# Patient Record
Sex: Male | Born: 1975 | Race: Black or African American | Hispanic: No | Marital: Single | Smoking: Current some day smoker
Health system: Southern US, Community
[De-identification: ages and names within clinical notes are randomized; demographics above are authoritative.]

## PROBLEM LIST (undated history)

## (undated) DIAGNOSIS — Q8901 Asplenia (congenital): Secondary | ICD-10-CM

## (undated) DIAGNOSIS — B20 Human immunodeficiency virus [HIV] disease: Secondary | ICD-10-CM

## (undated) DIAGNOSIS — E119 Type 2 diabetes mellitus without complications: Secondary | ICD-10-CM

## (undated) DIAGNOSIS — E785 Hyperlipidemia, unspecified: Secondary | ICD-10-CM

## (undated) DIAGNOSIS — Z21 Asymptomatic human immunodeficiency virus [HIV] infection status: Secondary | ICD-10-CM

## (undated) DIAGNOSIS — Z87442 Personal history of urinary calculi: Secondary | ICD-10-CM

## (undated) DIAGNOSIS — I10 Essential (primary) hypertension: Secondary | ICD-10-CM

## (undated) DIAGNOSIS — Z8619 Personal history of other infectious and parasitic diseases: Secondary | ICD-10-CM

---
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
## 2014-12-31 DIAGNOSIS — Z8619 Personal history of other infectious and parasitic diseases: Secondary | ICD-10-CM | POA: Insufficient documentation

## 2014-12-31 DIAGNOSIS — B20 Human immunodeficiency virus [HIV] disease: Secondary | ICD-10-CM | POA: Insufficient documentation

## 2014-12-31 DIAGNOSIS — Z9081 Acquired absence of spleen: Secondary | ICD-10-CM | POA: Insufficient documentation

## 2015-10-30 DIAGNOSIS — Z6841 Body Mass Index (BMI) 40.0 and over, adult: Secondary | ICD-10-CM

## 2015-10-30 DIAGNOSIS — A530 Latent syphilis, unspecified as early or late: Secondary | ICD-10-CM | POA: Insufficient documentation

## 2015-10-30 DIAGNOSIS — Z9081 Acquired absence of spleen: Secondary | ICD-10-CM | POA: Insufficient documentation

## 2015-10-30 DIAGNOSIS — Z21 Asymptomatic human immunodeficiency virus [HIV] infection status: Secondary | ICD-10-CM | POA: Insufficient documentation

## 2015-10-30 HISTORY — DX: Latent syphilis, unspecified as early or late: A53.0

## 2015-12-01 DIAGNOSIS — L404 Guttate psoriasis: Secondary | ICD-10-CM | POA: Insufficient documentation

## 2016-05-06 DIAGNOSIS — Z8619 Personal history of other infectious and parasitic diseases: Secondary | ICD-10-CM

## 2016-05-06 HISTORY — DX: Personal history of other infectious and parasitic diseases: Z86.19

## 2016-05-07 ENCOUNTER — Inpatient Hospital Stay
Admission: EM | Admit: 2016-05-07 | Discharge: 2016-05-12 | DRG: 871 | Disposition: A | Discharge status: Home / Self Care | Payer: Managed Care, Other (non HMO) | Attending: Internal Medicine | Admitting: Internal Medicine

## 2016-05-07 ENCOUNTER — Emergency Department: Payer: Managed Care, Other (non HMO)

## 2016-05-07 DIAGNOSIS — A4159 Other Gram-negative sepsis: Secondary | ICD-10-CM | POA: Diagnosis present

## 2016-05-07 DIAGNOSIS — R1084 Generalized abdominal pain: Secondary | ICD-10-CM | POA: Diagnosis not present

## 2016-05-07 DIAGNOSIS — E785 Hyperlipidemia, unspecified: Secondary | ICD-10-CM | POA: Diagnosis present

## 2016-05-07 DIAGNOSIS — A419 Sepsis, unspecified organism: Secondary | ICD-10-CM | POA: Diagnosis present

## 2016-05-07 DIAGNOSIS — D649 Anemia, unspecified: Secondary | ICD-10-CM | POA: Diagnosis present

## 2016-05-07 DIAGNOSIS — R0600 Dyspnea, unspecified: Secondary | ICD-10-CM

## 2016-05-07 DIAGNOSIS — Z8619 Personal history of other infectious and parasitic diseases: Secondary | ICD-10-CM

## 2016-05-07 DIAGNOSIS — I248 Other forms of acute ischemic heart disease: Secondary | ICD-10-CM | POA: Diagnosis present

## 2016-05-07 DIAGNOSIS — R509 Fever, unspecified: Secondary | ICD-10-CM | POA: Diagnosis not present

## 2016-05-07 DIAGNOSIS — R0902 Hypoxemia: Secondary | ICD-10-CM | POA: Diagnosis not present

## 2016-05-07 DIAGNOSIS — F17210 Nicotine dependence, cigarettes, uncomplicated: Secondary | ICD-10-CM | POA: Diagnosis present

## 2016-05-07 DIAGNOSIS — R112 Nausea with vomiting, unspecified: Secondary | ICD-10-CM

## 2016-05-07 DIAGNOSIS — Z01818 Encounter for other preprocedural examination: Secondary | ICD-10-CM | POA: Diagnosis not present

## 2016-05-07 DIAGNOSIS — Q8901 Asplenia (congenital): Secondary | ICD-10-CM

## 2016-05-07 DIAGNOSIS — E871 Hypo-osmolality and hyponatremia: Secondary | ICD-10-CM | POA: Diagnosis present

## 2016-05-07 DIAGNOSIS — R1110 Vomiting, unspecified: Secondary | ICD-10-CM | POA: Diagnosis not present

## 2016-05-07 DIAGNOSIS — J9601 Acute respiratory failure with hypoxia: Secondary | ICD-10-CM | POA: Diagnosis not present

## 2016-05-07 DIAGNOSIS — Z7982 Long term (current) use of aspirin: Secondary | ICD-10-CM

## 2016-05-07 DIAGNOSIS — R609 Edema, unspecified: Secondary | ICD-10-CM | POA: Diagnosis not present

## 2016-05-07 DIAGNOSIS — Z880 Allergy status to penicillin: Secondary | ICD-10-CM

## 2016-05-07 DIAGNOSIS — N359 Urethral stricture, unspecified: Secondary | ICD-10-CM | POA: Diagnosis present

## 2016-05-07 DIAGNOSIS — R109 Unspecified abdominal pain: Secondary | ICD-10-CM

## 2016-05-07 DIAGNOSIS — R17 Unspecified jaundice: Secondary | ICD-10-CM | POA: Diagnosis present

## 2016-05-07 DIAGNOSIS — Z6841 Body Mass Index (BMI) 40.0 and over, adult: Secondary | ICD-10-CM

## 2016-05-07 DIAGNOSIS — E1165 Type 2 diabetes mellitus with hyperglycemia: Secondary | ICD-10-CM | POA: Diagnosis present

## 2016-05-07 DIAGNOSIS — I10 Essential (primary) hypertension: Secondary | ICD-10-CM | POA: Diagnosis present

## 2016-05-07 DIAGNOSIS — N179 Acute kidney failure, unspecified: Secondary | ICD-10-CM | POA: Diagnosis present

## 2016-05-07 DIAGNOSIS — Z21 Asymptomatic human immunodeficiency virus [HIV] infection status: Secondary | ICD-10-CM | POA: Diagnosis present

## 2016-05-07 DIAGNOSIS — R1013 Epigastric pain: Secondary | ICD-10-CM

## 2016-05-07 DIAGNOSIS — N132 Hydronephrosis with renal and ureteral calculous obstruction: Secondary | ICD-10-CM | POA: Diagnosis present

## 2016-05-07 DIAGNOSIS — Z79899 Other long term (current) drug therapy: Secondary | ICD-10-CM

## 2016-05-07 DIAGNOSIS — N358 Other urethral stricture: Secondary | ICD-10-CM | POA: Diagnosis not present

## 2016-05-07 DIAGNOSIS — E440 Moderate protein-calorie malnutrition: Secondary | ICD-10-CM | POA: Insufficient documentation

## 2016-05-07 HISTORY — DX: Type 2 diabetes mellitus without complications: E11.9

## 2016-05-07 HISTORY — DX: Human immunodeficiency virus (HIV) disease: B20

## 2016-05-07 HISTORY — DX: Asymptomatic human immunodeficiency virus (hiv) infection status: Z21

## 2016-05-07 HISTORY — DX: Essential (primary) hypertension: I10

## 2016-05-07 HISTORY — DX: Asplenia (congenital): Q89.01

## 2016-05-07 LAB — TROPONIN I: Troponin I: 0.1 ng/mL (ref <0.03)

## 2016-05-07 LAB — COMPREHENSIVE METABOLIC PANEL
ALT: 22 U/L (ref 17–63)
ANION GAP: 10 (ref 5–15)
AST: 21 U/L (ref 15–41)
Albumin: 3.9 g/dL (ref 3.5–5.0)
Alkaline Phosphatase: 81 U/L (ref 38–126)
BILIRUBIN TOTAL: 1.3 mg/dL — AB (ref 0.3–1.2)
BUN: 14 mg/dL (ref 6–20)
CO2: 24 mmol/L (ref 22–32)
Calcium: 8.9 mg/dL (ref 8.9–10.3)
Chloride: 95 mmol/L — ABNORMAL LOW (ref 101–111)
Creatinine, Ser: 1.12 mg/dL (ref 0.61–1.24)
GFR calc Af Amer: >60 mL/min (ref >60)
Glucose, Bld: 301 mg/dL — ABNORMAL HIGH (ref 65–99)
POTASSIUM: 4 mmol/L (ref 3.5–5.1)
Sodium: 129 mmol/L — ABNORMAL LOW (ref 135–145)
TOTAL PROTEIN: 8.1 g/dL (ref 6.5–8.1)

## 2016-05-07 LAB — CBC WITH DIFFERENTIAL/PLATELET
Basophils Absolute: 0.1 10*3/uL (ref 0–0.1)
Basophils Relative: 0 %
EOS ABS: 0 10*3/uL (ref 0–0.7)
EOS PCT: 0 %
HCT: 40.6 % (ref 40.0–52.0)
HEMOGLOBIN: 13.8 g/dL (ref 13.0–18.0)
LYMPHS ABS: 0.5 10*3/uL — AB (ref 1.0–3.6)
Lymphocytes Relative: 3 %
MCH: 27.4 pg (ref 26.0–34.0)
MCHC: 34 g/dL (ref 32.0–36.0)
MCV: 80.6 fL (ref 80.0–100.0)
MONOS PCT: 6 %
Monocytes Absolute: 0.9 10*3/uL (ref 0.2–1.0)
NEUTROS PCT: 91 %
Neutro Abs: 14.9 10*3/uL — ABNORMAL HIGH (ref 1.4–6.5)
Platelets: 162 10*3/uL (ref 150–440)
RBC: 5.04 MIL/uL (ref 4.40–5.90)
RDW: 14.7 % — ABNORMAL HIGH (ref 11.5–14.5)
WBC: 16.4 10*3/uL — ABNORMAL HIGH (ref 3.8–10.6)

## 2016-05-07 LAB — URINALYSIS, COMPLETE (UACMP) WITH MICROSCOPIC
BILIRUBIN URINE: NEGATIVE
Bacteria, UA: NONE SEEN
KETONES UR: 80 mg/dL — AB
LEUKOCYTES UA: NEGATIVE
NITRITE: NEGATIVE
PH: 5 (ref 5.0–8.0)
Protein, ur: 100 mg/dL — AB
Specific Gravity, Urine: 1.033 — ABNORMAL HIGH (ref 1.005–1.030)

## 2016-05-07 LAB — INFLUENZA PANEL BY PCR (TYPE A & B)
Influenza A By PCR: NEGATIVE
Influenza B By PCR: NEGATIVE

## 2016-05-07 LAB — LACTIC ACID, PLASMA: LACTIC ACID, VENOUS: 2.2 mmol/L — AB (ref 0.5–1.9)

## 2016-05-07 LAB — LIPASE, BLOOD

## 2016-05-07 LAB — CK: Total CK: 252 U/L (ref 49–397)

## 2016-05-07 MED ORDER — DEXTROSE 5 % IV SOLN
2.0000 g | Freq: Once | INTRAVENOUS | Status: AC
  Administered 2016-05-07: 2 g via INTRAVENOUS
  Filled 2016-05-07: qty 2

## 2016-05-07 MED ORDER — SODIUM CHLORIDE 0.9 % IV BOLUS (SEPSIS)
1000.0000 mL | Freq: Once | INTRAVENOUS | Status: AC
  Administered 2016-05-07: 1000 mL via INTRAVENOUS

## 2016-05-07 MED ORDER — VANCOMYCIN HCL IN DEXTROSE 1-5 GM/200ML-% IV SOLN
1000.0000 mg | Freq: Once | INTRAVENOUS | Status: AC
  Administered 2016-05-07: 1000 mg via INTRAVENOUS
  Filled 2016-05-07 (×2): qty 200

## 2016-05-07 MED ORDER — ONDANSETRON HCL 4 MG/2ML IJ SOLN
4.0000 mg | Freq: Once | INTRAMUSCULAR | Status: AC
  Administered 2016-05-07: 4 mg via INTRAVENOUS

## 2016-05-07 MED ORDER — PROMETHAZINE HCL 25 MG/ML IJ SOLN
25.0000 mg | Freq: Once | INTRAMUSCULAR | Status: AC
  Administered 2016-05-07: 25 mg via INTRAVENOUS
  Filled 2016-05-07: qty 1

## 2016-05-07 MED ORDER — ONDANSETRON HCL 4 MG/2ML IJ SOLN
INTRAMUSCULAR | Status: AC
  Administered 2016-05-07: 4 mg via INTRAVENOUS
  Filled 2016-05-07: qty 2

## 2016-05-07 MED ORDER — LEVOFLOXACIN IN D5W 750 MG/150ML IV SOLN
750.0000 mg | Freq: Once | INTRAVENOUS | Status: AC
  Administered 2016-05-07: 750 mg via INTRAVENOUS
  Filled 2016-05-07 (×2): qty 150

## 2016-05-07 MED ORDER — ACETAMINOPHEN 650 MG RE SUPP
650.0000 mg | Freq: Four times a day (QID) | RECTAL | Status: DC | PRN
Start: 2016-05-07 — End: 2016-05-12

## 2016-05-07 MED ORDER — ACETAMINOPHEN 325 MG PO TABS
650.0000 mg | ORAL_TABLET | Freq: Four times a day (QID) | ORAL | Status: DC | PRN
  Administered 2016-05-07 – 2016-05-09 (×6): 650 mg via ORAL
  Filled 2016-05-07 (×6): qty 2

## 2016-05-07 MED ORDER — KETOROLAC TROMETHAMINE 30 MG/ML IJ SOLN
30.0000 mg | Freq: Once | INTRAMUSCULAR | Status: AC
  Administered 2016-05-07: 30 mg via INTRAVENOUS
  Filled 2016-05-07: qty 1

## 2016-05-07 MED ORDER — PROMETHAZINE HCL 25 MG/ML IJ SOLN
INTRAMUSCULAR | Status: AC
  Administered 2016-05-07: 25 mg via INTRAVENOUS
  Filled 2016-05-07: qty 1

## 2016-05-07 NOTE — H&P (Addendum)
History and Physical   [facility] - [place] [facility] Admission History and Physical [employer] Crayton Savarese, D.O.    Patient Name: Dave Conrad MR#: [id number] Date of Birth: [birth year] Date of Admission: 05/07/2016  Referring MD/NP/PA: Dr. Lenard LancePaduchowski Primary Care Physician: [facility] Patient coming from: Home Outpatient Specialists: [facility]   Chief Complaint:  Chief Complaint  Patient presents with  . Emesis  . Abdominal Pain    HPI: Dave Conrad is a 41 y.o. male with a known history of DM2, HIV, HTN presents to the emergency department for evaluation of vomiting.  Patient was in a usual state of health until 4 days ago when he describes the onset of vomiting foodstuffs. He states that he has had nausea and decreased by mouth intake over the past 4 days. He reports associated abdominal soreness. He reports a mild dry cough that has been ongoing. He denies abdominal cramping or diarrhea..   Otherwise there has been no change in status. Patient has been taking medication as prescribed and there has been no recent change in medication or diet.  No recent antibiotics.  There has been no recent illness, hospitalizations, travel or sick contacts.    Patient denies fevers/chills, weakness, dizziness, chest pain, shortness of breath,  abdominal pain, dysuria/frequency, changes in mental status.   ED Course: Patient received Levaquin, Azactam, Vanco, Toradol, Phenergan, Zofran, NS  Review of Systems:  CONSTITUTIONAL: No fever/chills, fatigue, weakness, weight gain/loss, headache. EYES: No blurry or double vision. ENT: No tinnitus, postnasal drip, redness or soreness of the oropharynx. RESPIRATORY: Positive dry cough, dyspnea, wheeze.  No hemoptysis.  CARDIOVASCULAR: No chest pain, palpitations, syncope, orthopnea. No lower extremity edema.  GASTROINTESTINAL: Positive nausea, vomiting, negative abdominal pain, diarrhea, constipation.  No hematemesis, melena or  hematochezia. GENITOURINARY: No dysuria, frequency, hematuria. ENDOCRINE: No polyuria or nocturia. No heat or cold intolerance. HEMATOLOGY: No anemia, bruising, bleeding. INTEGUMENTARY: No rashes, ulcers, lesions. MUSCULOSKELETAL: No arthritis, gout, dyspnea. NEUROLOGIC: No numbness, tingling, ataxia, seizure-type activity, weakness. PSYCHIATRIC: No anxiety, depression, insomnia.   Past Medical History:  Diagnosis Date  . Diabetes mellitus without complication (HCC)   . HIV (human immunodeficiency virus infection) (HCC)   . Hypertension     History reviewed. No pertinent surgical history.   reports that he has been smoking Cigarettes.  He has never used smokeless tobacco. He reports that he does not drink alcohol. His drug history is not on file.  Allergies  Allergen Reactions  . Penicillins Nausea And Vomiting   Family History   Medical History Relation Name Comments  Diabetes Father    Diabetes Mother       Current Medications Reconcile with Patient's Chart  Prescription Sig. Disp. Refills Start Date End Date Status  lisinopril (PRINIVIL) 40 mg tablet     10/08/2014  Active  diltiazem (DILACOR XR) 120 mg Extended Release capsule     10/08/2014  Active  ATRIPLA 600-200-300 mg Tablet  Take 1 Tab by mouth daily before breakfast.          Physical Exam: Vitals:   05/07/16 1253 05/07/16 1720 05/07/16 1956 05/07/16 2226  BP:   (!) 159/95 (!) 148/89  Pulse:   (!) 130 (!) 130  Resp:   (!) 28 18  Temp:  99.6 F (37.6 C) (!) 100.9 F (38.3 C)   TempSrc:  Oral Oral   SpO2:   96% 99%  Weight: 120.2 kg (265 lb)   120.2 kg (265 lb)  Height: 5\' 6"  (  1.676 m)   5\' 6"  (1.676 m)    GENERAL: 41 y.o.-year-old male patient, well-developed, well-nourished lying in the bed in no acute distress.  Pleasant and cooperative.   HEENT: Head atraumatic, normocephalic. Pupils equal, round, reactive to light and accommodation. No scleral icterus. Extraocular muscles  intact. Nares are patent.  Possibly a shiny whitish film in the interior oral mucosa. No tongue lesions. Mucus membranes moist. NECK: Supple, full range of motion. No JVD, no bruit heard. No thyroid enlargement, no tenderness, no cervical lymphadenopathy. CHEST: Normal breath sounds bilaterally. No wheezing, rales, rhonchi or crackles. No use of accessory muscles of respiration.  No reproducible chest wall tenderness.  CARDIOVASCULAR: S1, S2 normal. No murmurs, rubs, or gallops. Cap refill <2 seconds. Pulses intact distally.  ABDOMEN: Soft, nondistended, nontender. No rebound, guarding, rigidity. Normoactive bowel sounds present in all four quadrants. No organomegaly or mass. EXTREMITIES: No pedal edema, cyanosis, or clubbing. No calf tenderness or Homan's sign.  NEUROLOGIC: The patient is alert and oriented x 3. Cranial nerves II through XII are grossly intact with no focal sensorimotor deficit. Muscle strength 5/5 in all extremities. Sensation intact. Gait not checked. PSYCHIATRIC:  Normal affect, mood, thought content. SKIN: Warm, dry, and intact without obvious rash, lesion, or ulcer.    Labs on Admission:  CBC:  Recent Labs Lab 05/07/16 1507  WBC 16.4*  NEUTROABS 14.9*  HGB 13.8  HCT 40.6  MCV 80.6  PLT 162   Basic Metabolic Panel:  Recent Labs Lab 05/07/16 1259  NA 129*  K 4.0  CL 95*  CO2 24  GLUCOSE 301*  BUN 14  CREATININE 1.12  CALCIUM 8.9   GFR: Estimated Creatinine Clearance: 108.2 mL/min (by C-G formula based on SCr of 1.12 mg/dL). Liver Function Tests:  Recent Labs Lab 05/07/16 1259  AST 21  ALT 22  ALKPHOS 81  BILITOT 1.3*  PROT 8.1  ALBUMIN 3.9    Recent Labs Lab 05/07/16 1259  LIPASE <10*   No results for input(s): AMMONIA in the last 168 hours. Coagulation Profile: No results for input(s): INR, PROTIME in the last 168 hours. Cardiac Enzymes:  Recent Labs Lab 05/07/16 1507  CKTOTAL 252   BNP (last 3 results) No results for  input(s): PROBNP in the last 8760 hours. HbA1C: No results for input(s): HGBA1C in the last 72 hours. CBG: No results for input(s): GLUCAP in the last 168 hours. Lipid Profile: No results for input(s): CHOL, HDL, LDLCALC, TRIG, CHOLHDL, LDLDIRECT in the last 72 hours. Thyroid Function Tests: No results for input(s): TSH, T4TOTAL, FREET4, T3FREE, THYROIDAB in the last 72 hours. Anemia Panel: No results for input(s): VITAMINB12, FOLATE, FERRITIN, TIBC, IRON, RETICCTPCT in the last 72 hours. Urine analysis:    Component Value Date/Time   COLORURINE AMBER (A) 05/07/2016 1259   APPEARANCEUR CLEAR (A) 05/07/2016 1259   LABSPEC 1.033 (H) 05/07/2016 1259   PHURINE 5.0 05/07/2016 1259   GLUCOSEU >=500 (A) 05/07/2016 1259   HGBUR MODERATE (A) 05/07/2016 1259   BILIRUBINUR NEGATIVE 05/07/2016 1259   KETONESUR 80 (A) 05/07/2016 1259   PROTEINUR 100 (A) 05/07/2016 1259   NITRITE NEGATIVE 05/07/2016 1259   LEUKOCYTESUR NEGATIVE 05/07/2016 1259   Sepsis Labs: @LABRCNTIP (procalcitonin:4,lacticidven:4) )No results found for this or any previous visit (from the past 240 hour(s)).   Radiological Exams on Admission: Dg Chest Portable 1 View  Result Date: 05/07/2016 CLINICAL DATA:  41 year old male with shortness of breath and fever. EXAM: PORTABLE CHEST 1 VIEW COMPARISON:  None. FINDINGS:  The heart size and mediastinal contours are within normal limits. Both lungs are clear. The visualized skeletal structures are unremarkable. IMPRESSION: No active disease. Electronically Signed   By: Elgie Collard M.D.   On: 05/07/2016 20:16    EKG: Sinus tach at 130bpm with normal axis and nonspecific ST-T wave changes.   Assessment/Plan  This is a 41 y.o. male with a history of DM2, HIV, HTN now being admitted with:  #. Sepsis in a patient with HIV, unclear source. Patient does have fevers, tachycardia, leukocytosis and hypoxia down to 88% on room air, with no signs or symptoms of pneumonia. We will  therefore - Admit to inpatient with telemetry monitoring - IV antibiotics: Azactam, Levaquin, Vanco - IV fluid hydration - Check CD4 counts - Follow up blood,urine & sputum cultures.  - Trend lactate Repeat CBC in am.  - Infectious disease consultation has been requested  #. Intractable nausea, vomiting - Antiemetics - IV fluid hydration  #. Elevated troponin, trending down - Monitor on tele - Trend trops  #. Hyponatremia - IV fluids and recheck BMP in AM  #. H/o Diabetes - Accuchecks achs with RISS coverage - Clear liquids, advance at tolerated to heart health, carb controlled - Hold metformin, glipizide  #. History of hypertension - Continue diltizaem, lisinopril with hold parameters  #. History of HIV - Continue Atripla - Check CD4 count  #. History of HLD  - Continue pravastatin  Admission status: Inpatient, telemetry, continuous pulse ox IV Fluids: NS Diet/Nutrition: Clears -> HH, CC Consults called: ID  DVT Px: Lovenox, SCDs and early ambulation. Code Status: Full Code  Disposition Plan: To home in 2-3 days  All the records are reviewed and case discussed with ED provider. Management plans discussed with the patient and/or family who express understanding and agree with plan of care.  Mayes Sangiovanni D.O. on 05/07/2016 at 10:29 PM Between 7am to 6pm - Pager - [phone] After 6pm go to [email or web address] - Social research officer, government [facility] [place] [facility] Office [phone] CC: Primary care physician; [facility]   05/07/2016, 10:29 PM

## 2016-05-07 NOTE — ED Notes (Addendum)
Upon assessing pt, this nurse found pt to be febrile and reporting he feels very cold. Pt was shivering at this time, temp was checked and it was 100.9 orally. Pt states hx of shivering when he has a fever. Pt's BP was checked and pt placed on 5 lead EKG which showed sinus tach. Pt's oxygen also dropped to 86% on RA so pt was put on 4L of O2, MD notified of all of this, EKG done per verbal order and given to MD. While acquiring EKG pt stated his shivering was decreasing and he stated he felt better. Pt's shakiness has decreased at this time.

## 2016-05-07 NOTE — ED Triage Notes (Signed)
Patient presents to the ED with nausea, vomiting and diarrhea since Monday as well as lower abdominal pain.  Patient reports 16 episodes of vomiting in the past 24 hours.  Patient is HIV positive.

## 2016-05-07 NOTE — ED Provider Notes (Signed)
[facility] Emergency Department Provider Note  Time seen: 3:11 PM  I have reviewed the triage vital signs and the nursing notes.   HISTORY  Chief Complaint Emesis and Abdominal Pain    HPI Dave Conrad is a 41 y.o. male with a past medical history of diabetes, hypertension, HIV who presents to the emergency department for 4 days of vomiting. According to the patient for the past 4 days he has been nauseated with frequent episodes of vomiting. He last vomited this morning. Denies any diarrhea. States mild upper abdominal cramping which he believes is due to the vomiting. Denies fever, cough, congestion, dysuria, hematuria.Currently appears well, no distress, denies any abdominal discomfort currently.  Past Medical History:  Diagnosis Date  . Diabetes mellitus without complication (HCC)   . HIV (human immunodeficiency virus infection) (HCC)   . Hypertension     There are no active problems to display for this patient.   History reviewed. No pertinent surgical history.  Prior to Admission medications   Not on File    Allergies  Allergen Reactions  . Penicillins Nausea And Vomiting    History reviewed. No pertinent family history.  Social History Social History  Substance Use Topics  . Smoking status: Current Some Day Smoker    Types: Cigarettes  . Smokeless tobacco: Never Used  . Alcohol use No    Review of Systems Constitutional: Negative for fever. Cardiovascular: Negative for chest pain. Respiratory: Negative for shortness of breath. Gastrointestinal: Intermittent mild upper abdominal cramping. Positive for nausea and vomiting. Negative for diarrhea. Negative constipation. Genitourinary: Negative for dysuria. Negative for hematuria Musculoskeletal: Negative for back pain. Neurological: Negative for headache 10-point ROS otherwise negative.  ____________________________________________   PHYSICAL EXAM:  VITAL SIGNS: ED Triage  Vitals  Enc Vitals Group     BP 05/07/16 1252 (!) 165/101     Pulse Rate 05/07/16 1252 77     Resp 05/07/16 1252 18     Temp 05/07/16 1252 97.6 F (36.4 C)     Temp Source 05/07/16 1252 Oral     SpO2 05/07/16 1252 96 %     Weight 05/07/16 1253 265 lb (120.2 kg)     Height 05/07/16 1253 5\' 6"  (1.676 m)     Head Circumference --      Peak Flow --      Pain Score 05/07/16 1253 7     Pain Loc --      Pain Edu? --      Excl. in GC? --     Constitutional: Alert and oriented. Well appearing and in no distress. Eyes: Normal exam ENT   Head: Normocephalic and atraumatic.   Mouth/Throat: Mucous membranes are moist. Cardiovascular: Normal rate, regular rhythm. No murmur Respiratory: Normal respiratory effort without tachypnea nor retractions. Breath sounds are clear  Gastrointestinal: Soft and nontender. No distention.  Musculoskeletal: Nontender with normal range of motion in all extremities.  Neurologic:  Normal speech and language. No gross focal neurologic deficits  Skin:  Skin is warm, dry and intact.  Psychiatric: Mood and affect are normal.   ____________________________________________   INITIAL IMPRESSION / ASSESSMENT AND PLAN / ED COURSE  Pertinent labs & imaging results that were available during my care of the patient were reviewed by me and considered in my medical decision making (see chart for details).  The patient presents to emergency department with nausea and vomiting for the past 4 days. Denies fever, states intermittent abdominal cramping but denies any currently.  Denies diarrhea. Overall patient appears well. We will check labs, IV hydrate, treat nausea and closely monitor.  Patient's labs show a moderate leukocytosis of 16,000. Patient continues to spike fevers. Patient now become hypoxic Route 88% on room air his heart rate is increased to 151. Temp is also increased 100.9. At this time given the patient's continued symptoms, history of HIV, we will start  sepsis protocols, including broad-spectrum antibiotics. We will also check lactic acid, influenza, blood cultures and continue to closely monitor. Patient's chest x-ray is negative.   EKG reviewed and interpreted by myself shows sinus tachycardia 130 bpm, narrow QRS, normal axis, normal intervals, nonspecific ST changes without ST elevation.  Patient's lactate has resulted at 2.2. Influenza negative. Continues to be nauseated, nontender abdomen. We'll continue with broad-spectrum antibiotics, IV fluids and admission to the hospital for further workup.  CRITICAL CARE Performed by: Minna AntisPADUCHOWSKI, Sabrine Patchen   Total critical care time: 60 minutes  Critical care time was exclusive of separately billable procedures and treating other patients.  Critical care was necessary to treat or prevent imminent or life-threatening deterioration.  Critical care was time spent personally by me on the following activities: development of treatment plan with patient and/or surrogate as well as nursing, discussions with consultants, evaluation of patient's response to treatment, examination of patient, obtaining history from patient or surrogate, ordering and performing treatments and interventions, ordering and review of laboratory studies, ordering and review of radiographic studies, pulse oximetry and re-evaluation of patient's condition.   ____________________________________________   FINAL CLINICAL IMPRESSION(S) / ED DIAGNOSES  Nausea vomiting Sepsis   Minna AntisKevin Valaria Kohut, MD 05/07/16 2225

## 2016-05-07 NOTE — ED Triage Notes (Signed)
Pt c/o bil lower cramping abd pain starting Monday. +emesis, denies diarrhea. On atripla for HIV.

## 2016-05-07 NOTE — ED Notes (Signed)
pts sister called, asking for information on pts results and testing, sister informed due to privacy pts care could not be discussed over phone but her brother was awake and alert and able to communicate, pt made aware of sister calling

## 2016-05-08 ENCOUNTER — Encounter: Payer: Self-pay | Admitting: Infectious Diseases

## 2016-05-08 ENCOUNTER — Inpatient Hospital Stay: Payer: Managed Care, Other (non HMO)

## 2016-05-08 DIAGNOSIS — Q8901 Asplenia (congenital): Secondary | ICD-10-CM

## 2016-05-08 HISTORY — DX: Asplenia (congenital): Q89.01

## 2016-05-08 LAB — GLUCOSE, CAPILLARY
GLUCOSE-CAPILLARY: 282 mg/dL — AB (ref 65–99)
GLUCOSE-CAPILLARY: 326 mg/dL — AB (ref 65–99)
GLUCOSE-CAPILLARY: 196 mg/dL — AB (ref 65–99)
Glucose-Capillary: 264 mg/dL — ABNORMAL HIGH (ref 65–99)
Glucose-Capillary: 270 mg/dL — ABNORMAL HIGH (ref 65–99)

## 2016-05-08 LAB — COMPREHENSIVE METABOLIC PANEL
ALBUMIN: 3.6 g/dL (ref 3.5–5.0)
ALT: 30 U/L (ref 17–63)
ALT: 32 U/L (ref 17–63)
ANION GAP: 13 (ref 5–15)
AST: 46 U/L — ABNORMAL HIGH (ref 15–41)
AST: 45 U/L — ABNORMAL HIGH (ref 15–41)
Albumin: 3.3 g/dL — ABNORMAL LOW (ref 3.5–5.0)
Alkaline Phosphatase: 145 U/L — ABNORMAL HIGH (ref 38–126)
Alkaline Phosphatase: 111 U/L (ref 38–126)
Anion gap: 10 (ref 5–15)
BUN: 18 mg/dL (ref 6–20)
BUN: 17 mg/dL (ref 6–20)
CALCIUM: 8.5 mg/dL — AB (ref 8.9–10.3)
CHLORIDE: 97 mmol/L — AB (ref 101–111)
CO2: 24 mmol/L (ref 22–32)
CO2: 23 mmol/L (ref 22–32)
CREATININE: 1.36 mg/dL — AB (ref 0.61–1.24)
Calcium: 8.5 mg/dL — ABNORMAL LOW (ref 8.9–10.3)
Chloride: 93 mmol/L — ABNORMAL LOW (ref 101–111)
Creatinine, Ser: 1.48 mg/dL — ABNORMAL HIGH (ref 0.61–1.24)
GFR calc non Af Amer: 58 mL/min — ABNORMAL LOW (ref >60)
Glucose, Bld: 305 mg/dL — ABNORMAL HIGH (ref 65–99)
Glucose, Bld: 266 mg/dL — ABNORMAL HIGH (ref 65–99)
POTASSIUM: 4 mmol/L (ref 3.5–5.1)
POTASSIUM: 3.8 mmol/L (ref 3.5–5.1)
Sodium: 130 mmol/L — ABNORMAL LOW (ref 135–145)
Sodium: 130 mmol/L — ABNORMAL LOW (ref 135–145)
Total Bilirubin: 2.4 mg/dL — ABNORMAL HIGH (ref 0.3–1.2)
Total Bilirubin: 3.2 mg/dL — ABNORMAL HIGH (ref 0.3–1.2)
Total Protein: 7.8 g/dL (ref 6.5–8.1)
Total Protein: 7.3 g/dL (ref 6.5–8.1)

## 2016-05-08 LAB — TROPONIN I
TROPONIN I: 0.08 ng/mL — AB (ref <0.03)
TROPONIN I: 0.14 ng/mL — AB (ref <0.03)
TROPONIN I: 0.07 ng/mL — AB (ref <0.03)
Troponin I: 0.09 ng/mL (ref <0.03)

## 2016-05-08 LAB — BLOOD CULTURE ID PANEL (REFLEXED)
Acinetobacter baumannii: NOT DETECTED
CANDIDA GLABRATA: NOT DETECTED
CANDIDA KRUSEI: NOT DETECTED
CANDIDA PARAPSILOSIS: NOT DETECTED
CANDIDA TROPICALIS: NOT DETECTED
Candida albicans: NOT DETECTED
Carbapenem resistance: NOT DETECTED
ESCHERICHIA COLI: NOT DETECTED
Enterobacter cloacae complex: NOT DETECTED
Enterobacteriaceae species: DETECTED — AB
Enterococcus species: NOT DETECTED
Haemophilus influenzae: NOT DETECTED
KLEBSIELLA PNEUMONIAE: NOT DETECTED
Klebsiella oxytoca: NOT DETECTED
Listeria monocytogenes: NOT DETECTED
NEISSERIA MENINGITIDIS: NOT DETECTED
PROTEUS SPECIES: DETECTED — AB
Pseudomonas aeruginosa: NOT DETECTED
SERRATIA MARCESCENS: NOT DETECTED
STAPHYLOCOCCUS SPECIES: NOT DETECTED
STREPTOCOCCUS AGALACTIAE: NOT DETECTED
Staphylococcus aureus (BCID): NOT DETECTED
Streptococcus pneumoniae: NOT DETECTED
Streptococcus pyogenes: NOT DETECTED
Streptococcus species: NOT DETECTED

## 2016-05-08 LAB — CBC WITH DIFFERENTIAL/PLATELET
BASOS ABS: 0 10*3/uL (ref 0–0.1)
BASOS PCT: 0 %
Eosinophils Absolute: 0 10*3/uL (ref 0–0.7)
Eosinophils Relative: 0 %
HEMATOCRIT: 39.2 % — AB (ref 40.0–52.0)
HEMOGLOBIN: 13 g/dL (ref 13.0–18.0)
LYMPHS PCT: 2 %
Lymphs Abs: 0.2 10*3/uL — ABNORMAL LOW (ref 1.0–3.6)
MCH: 26.7 pg (ref 26.0–34.0)
MCHC: 33.2 g/dL (ref 32.0–36.0)
MCV: 80.6 fL (ref 80.0–100.0)
MONOS PCT: 1 %
Monocytes Absolute: 0.2 10*3/uL (ref 0.2–1.0)
NEUTROS ABS: 12.1 10*3/uL — AB (ref 1.4–6.5)
NEUTROS PCT: 97 %
Platelets: 171 10*3/uL (ref 150–440)
RBC: 4.87 MIL/uL (ref 4.40–5.90)
RDW: 14.7 % — AB (ref 11.5–14.5)
WBC: 12.5 10*3/uL — ABNORMAL HIGH (ref 3.8–10.6)

## 2016-05-08 LAB — PROCALCITONIN: Procalcitonin: 126.95 ng/mL

## 2016-05-08 LAB — PROTIME-INR
INR: 1.24
Prothrombin Time: 15.7 seconds — ABNORMAL HIGH (ref 11.4–15.2)

## 2016-05-08 LAB — HEPARIN LEVEL (UNFRACTIONATED): Heparin Unfractionated: <0.1 IU/mL — ABNORMAL LOW (ref 0.30–0.70)

## 2016-05-08 LAB — LIPASE, BLOOD

## 2016-05-08 LAB — BRAIN NATRIURETIC PEPTIDE: B NATRIURETIC PEPTIDE 5: 367 pg/mL — AB (ref 0.0–100.0)

## 2016-05-08 LAB — LACTIC ACID, PLASMA: Lactic Acid, Venous: 1.5 mmol/L (ref 0.5–1.9)

## 2016-05-08 LAB — APTT: aPTT: 29 seconds (ref 24–36)

## 2016-05-08 MED ORDER — ONDANSETRON HCL 4 MG/2ML IJ SOLN
4.0000 mg | Freq: Four times a day (QID) | INTRAMUSCULAR | Status: DC | PRN
  Administered 2016-05-10: 4 mg via INTRAVENOUS
  Filled 2016-05-08: qty 2

## 2016-05-08 MED ORDER — SODIUM CHLORIDE 0.9 % IV SOLN
INTRAVENOUS | Status: DC
  Administered 2016-05-08 – 2016-05-09 (×3): via INTRAVENOUS

## 2016-05-08 MED ORDER — LEVOFLOXACIN IN D5W 750 MG/150ML IV SOLN
750.0000 mg | Freq: Once | INTRAVENOUS | Status: DC
  Filled 2016-05-08: qty 150

## 2016-05-08 MED ORDER — MEROPENEM-SODIUM CHLORIDE 1 GM/50ML IV SOLR
1.0000 g | Freq: Three times a day (TID) | INTRAVENOUS | Status: DC
  Administered 2016-05-08 – 2016-05-11 (×9): 1 g via INTRAVENOUS
  Filled 2016-05-08 (×10): qty 50

## 2016-05-08 MED ORDER — SODIUM CHLORIDE 0.9 % IV SOLN
1.0000 g | Freq: Three times a day (TID) | INTRAVENOUS | Status: DC
  Administered 2016-05-08: 1 g via INTRAVENOUS
  Filled 2016-05-08 (×3): qty 1

## 2016-05-08 MED ORDER — SODIUM CHLORIDE 0.9 % IV BOLUS (SEPSIS): 1000.0000 mL | Freq: Once | INTRAVENOUS | Status: DC

## 2016-05-08 MED ORDER — ACETAMINOPHEN 325 MG PO TABS
650.0000 mg | ORAL_TABLET | Freq: Once | ORAL | Status: AC
  Administered 2016-05-08: 650 mg via ORAL
  Filled 2016-05-08: qty 2

## 2016-05-08 MED ORDER — HEPARIN BOLUS VIA INFUSION
4000.0000 [IU] | Freq: Once | INTRAVENOUS | Status: AC
  Administered 2016-05-08 (×2): 4000 [IU] via INTRAVENOUS
  Filled 2016-05-08: qty 4000

## 2016-05-08 MED ORDER — DEXTROSE 5 % IV SOLN: 2.0000 g | Freq: Once | INTRAVENOUS | Status: DC

## 2016-05-08 MED ORDER — SODIUM CHLORIDE 0.9% FLUSH
3.0000 mL | Freq: Two times a day (BID) | INTRAVENOUS | Status: DC
Start: 2016-05-08 — End: 2016-05-12
  Administered 2016-05-08 – 2016-05-11 (×7): 3 mL via INTRAVENOUS

## 2016-05-08 MED ORDER — MAGNESIUM CITRATE PO SOLN
1.0000 | Freq: Once | ORAL | Status: DC | PRN
  Filled 2016-05-08: qty 296

## 2016-05-08 MED ORDER — DILTIAZEM HCL ER COATED BEADS 180 MG PO CP24
180.0000 mg | ORAL_CAPSULE | Freq: Every day | ORAL | Status: DC
  Administered 2016-05-08 – 2016-05-12 (×6): 180 mg via ORAL
  Filled 2016-05-08 (×10): qty 1

## 2016-05-08 MED ORDER — BISACODYL 5 MG PO TBEC: 5.0000 mg | DELAYED_RELEASE_TABLET | Freq: Every day | ORAL | Status: DC | PRN

## 2016-05-08 MED ORDER — IPRATROPIUM BROMIDE 0.02 % IN SOLN: 0.5000 mg | Freq: Four times a day (QID) | RESPIRATORY_TRACT | Status: DC | PRN

## 2016-05-08 MED ORDER — EFAVIRENZ-EMTRICITAB-TENOFOVIR 600-200-300 MG PO TABS
1.0000 | ORAL_TABLET | Freq: Every day | ORAL | Status: DC
  Administered 2016-05-08 – 2016-05-11 (×4): 1 via ORAL
  Filled 2016-05-08 (×7): qty 1

## 2016-05-08 MED ORDER — INSULIN ASPART 100 UNIT/ML ~~LOC~~ SOLN
0.0000 [IU] | Freq: Every day | SUBCUTANEOUS | Status: DC
Start: 2016-05-08 — End: 2016-05-09
  Filled 2016-05-08: qty 7

## 2016-05-08 MED ORDER — INSULIN GLARGINE 100 UNIT/ML ~~LOC~~ SOLN
10.0000 [IU] | Freq: Every day | SUBCUTANEOUS | Status: DC
  Administered 2016-05-08: 10 [IU] via SUBCUTANEOUS
  Filled 2016-05-08 (×2): qty 0.1

## 2016-05-08 MED ORDER — INSULIN ASPART 100 UNIT/ML ~~LOC~~ SOLN
0.0000 [IU] | Freq: Three times a day (TID) | SUBCUTANEOUS | Status: DC
  Administered 2016-05-08 (×2): 11 [IU] via SUBCUTANEOUS
  Administered 2016-05-08: 15 [IU] via SUBCUTANEOUS
  Administered 2016-05-09 (×3): 7 [IU] via SUBCUTANEOUS
  Filled 2016-05-08: qty 15
  Filled 2016-05-08: qty 7
  Filled 2016-05-08: qty 11
  Filled 2016-05-08: qty 15
  Filled 2016-05-08: qty 11
  Filled 2016-05-08: qty 7

## 2016-05-08 MED ORDER — ZOLPIDEM TARTRATE 5 MG PO TABS: 5.0000 mg | ORAL_TABLET | Freq: Every evening | ORAL | Status: DC | PRN

## 2016-05-08 MED ORDER — OXYCODONE HCL 5 MG PO TABS
5.0000 mg | ORAL_TABLET | ORAL | Status: DC | PRN
  Administered 2016-05-08: 5 mg via ORAL
  Filled 2016-05-08: qty 1

## 2016-05-08 MED ORDER — PRAVASTATIN SODIUM 20 MG PO TABS
20.0000 mg | ORAL_TABLET | Freq: Every day | ORAL | Status: DC
  Administered 2016-05-08 – 2016-05-12 (×4): 20 mg via ORAL
  Filled 2016-05-08 (×4): qty 1

## 2016-05-08 MED ORDER — IBUPROFEN 100 MG/5ML PO SUSP
400.0000 mg | Freq: Once | ORAL | Status: DC
  Filled 2016-05-08: qty 20

## 2016-05-08 MED ORDER — LISINOPRIL 20 MG PO TABS
40.0000 mg | ORAL_TABLET | Freq: Every day | ORAL | Status: DC
  Administered 2016-05-08 (×2): 40 mg via ORAL
  Filled 2016-05-08 (×2): qty 2

## 2016-05-08 MED ORDER — VANCOMYCIN HCL IN DEXTROSE 1-5 GM/200ML-% IV SOLN
1000.0000 mg | Freq: Three times a day (TID) | INTRAVENOUS | Status: DC
  Administered 2016-05-08 – 2016-05-09 (×4): 1000 mg via INTRAVENOUS
  Filled 2016-05-08 (×7): qty 200

## 2016-05-08 MED ORDER — VANCOMYCIN HCL IN DEXTROSE 1-5 GM/200ML-% IV SOLN
1000.0000 mg | Freq: Once | INTRAVENOUS | Status: AC
  Administered 2016-05-08: 1000 mg via INTRAVENOUS
  Filled 2016-05-08 (×2): qty 200

## 2016-05-08 MED ORDER — ONDANSETRON HCL 4 MG PO TABS: 4.0000 mg | ORAL_TABLET | Freq: Four times a day (QID) | ORAL | Status: DC | PRN

## 2016-05-08 MED ORDER — ENOXAPARIN SODIUM 40 MG/0.4ML ~~LOC~~ SOLN: 40.0000 mg | SUBCUTANEOUS | Status: DC

## 2016-05-08 MED ORDER — ALBUTEROL SULFATE (2.5 MG/3ML) 0.083% IN NEBU
2.5000 mg | INHALATION_SOLUTION | Freq: Four times a day (QID) | RESPIRATORY_TRACT | Status: DC
  Administered 2016-05-08: 2.5 mg via RESPIRATORY_TRACT
  Filled 2016-05-08 (×2): qty 3

## 2016-05-08 MED ORDER — SENNOSIDES-DOCUSATE SODIUM 8.6-50 MG PO TABS: 1.0000 | ORAL_TABLET | Freq: Every evening | ORAL | Status: DC | PRN

## 2016-05-08 MED ORDER — HEPARIN (PORCINE) IN NACL 100-0.45 UNIT/ML-% IJ SOLN
1500.0000 [IU]/h | INTRAMUSCULAR | Status: DC
  Administered 2016-05-08: 1500 [IU]/h via INTRAVENOUS
  Filled 2016-05-08 (×2): qty 250

## 2016-05-08 MED ORDER — IBUPROFEN 400 MG PO TABS
400.0000 mg | ORAL_TABLET | Freq: Once | ORAL | Status: AC
  Administered 2016-05-08: 400 mg via ORAL
  Filled 2016-05-08: qty 1

## 2016-05-08 MED ORDER — ASPIRIN EC 81 MG PO TBEC
81.0000 mg | DELAYED_RELEASE_TABLET | Freq: Every day | ORAL | Status: DC
  Administered 2016-05-08 – 2016-05-12 (×5): 81 mg via ORAL
  Filled 2016-05-08 (×5): qty 1

## 2016-05-08 MED ORDER — IBUPROFEN 100 MG PO CHEW
400.0000 mg | CHEWABLE_TABLET | Freq: Once | ORAL | Status: DC
  Filled 2016-05-08: qty 4

## 2016-05-08 MED ORDER — HEPARIN (PORCINE) IN NACL 100-0.45 UNIT/ML-% IJ SOLN
1700.0000 [IU]/h | INTRAMUSCULAR | Status: DC
  Administered 2016-05-08: 1500 [IU]/h via INTRAVENOUS
  Filled 2016-05-08 (×2): qty 250

## 2016-05-08 MED ORDER — ALBUTEROL SULFATE (2.5 MG/3ML) 0.083% IN NEBU: 2.5000 mg | INHALATION_SOLUTION | Freq: Four times a day (QID) | RESPIRATORY_TRACT | Status: DC | PRN

## 2016-05-08 MED ORDER — METOPROLOL TARTRATE 5 MG/5ML IV SOLN
5.0000 mg | INTRAVENOUS | Status: DC | PRN
  Administered 2016-05-08: 5 mg via INTRAVENOUS
  Filled 2016-05-08: qty 5

## 2016-05-08 NOTE — ED Notes (Signed)
Dr. Emmit PomfretHugelmeyer at bedside at this time. Started 1,04700ml fluid bolus per verbal order.

## 2016-05-08 NOTE — Progress Notes (Signed)
Pt is alert and oriented to the unit. Placed on the cardiac monitor. Pt needs a headache, Pt believes sleep will relieve it. Temp has decrease from 103 F to 100.1 F. Pt has been tachycardic and Bp has been inc. EDP aware.  Partner at bedside.

## 2016-05-08 NOTE — Progress Notes (Addendum)
ANTICOAGULATION CONSULT NOTE - Initial Consult  Pharmacy Consult for Heparin Drip  Indication: pulmonary embolus  Allergies  Allergen Reactions  . Penicillins Nausea And Vomiting    Has patient had a PCN reaction causing immediate rash, facial/tongue/throat swelling, SOB or lightheadedness with hypotension: yes rash Has patient had a PCN reaction causing severe rash involving mucus membranes or skin necrosis:no Has patient had a PCN reaction that required hospitalization no Has patient had a PCN reaction occurring within the last 10 years: yes If all of the above answers are "NO", then may proceed with Cephalosporin use.     Patient Measurements: Height: 5\' 6"  (167.6 cm) Weight: 267 lb 9.6 oz (121.4 kg) IBW/kg (Calculated) : 63.8 Heparin Dosing Weight: 92 kg  Vital Signs: Temp: 98.6 F (37 C) (03/16 1634) Temp Source: Oral (03/16 1634) BP: 131/73 (03/16 1634) Pulse Rate: 77 (03/16 1634)  Labs:  Recent Labs  05/07/16 1259 05/07/16 1507  05/08/16 0123 05/08/16 0823 05/08/16 1348  HGB  --  13.8  --  13.0  --   --   HCT  --  40.6  --  39.2*  --   --   PLT  --  162  --  171  --   --   APTT  --   --   --  29  --   --   LABPROT  --   --   --  15.7*  --   --   INR  --   --   --  1.24  --   --   CREATININE 1.12  --   --  1.48*  --   --   CKTOTAL  --  252  --   --   --   --   TROPONINI  --   --   < > 0.08* 0.14* 0.07*  < > = values in this interval not displayed.  Estimated Creatinine Clearance: 82.3 mL/min (A) (by C-G formula based on SCr of 1.48 mg/dL (H)).   Medical History: Past Medical History:  Diagnosis Date  . Asplenia 05/08/2016  . Diabetes mellitus without complication (HCC)   . HIV (human immunodeficiency virus infection) (HCC)   . Hypertension      Assessment:  Goal of Therapy:  Heparin level 0.3-0.7 units/ml Monitor platelets by anticoagulation protocol: Yes   Plan:  DW: 92 kg  Baseline labs ordered.  Bolus 4000 units  Start heparin infusion  at 1500 units/hr Check anti-Xa level in 6 hours and daily while on heparin Continue to monitor H&H and platelets  Gardner CandleSheema M Xavia Kniskern, PharmD, BCPS Clinical Pharmacist 05/08/2016 4:40 PM

## 2016-05-08 NOTE — Consult Note (Signed)
[facility] Infectious Disease     Reason for Consult: Fever, HIV    Referring Physician: Earleen Newport, R Date of Admission:  05/07/2016   Active Problems:   Sepsis ([place])   HPI: Dave Conrad is a 41 y.o. male with well controlled HIV, Last CD4 688 and VL <20  in Jan 2018, DM and HTN admitted with vomiting and fevers. States began 5 days ago with nasuea and vomiting and mild abd pain. Fevers began soon after. He thought it was food poisoning but persisted. He has not had any diarrhea, in fact has not had BM since Monday until last night when he had a nml formed stool.  He has not had cough but now has become hypoxic. He has no recent travel, sick contacts. Has hx Syphilis treated in around Sept with decrease in his RPR.  He follows at [facility] for his HIV and is on atripla  His sister is in the room and does not know he has HIV  Past Medical History:  Diagnosis Date  . Diabetes mellitus without complication ([place])   . HIV (human immunodeficiency virus infection) ([place])   . Hypertension    History reviewed. No pertinent surgical history. Social History  Substance Use Topics  . Smoking status: Current Some Day Smoker    Types: Cigarettes  . Smokeless tobacco: Never Used  . Alcohol use No   History reviewed. No pertinent family history.  Allergies:  Allergies  Allergen Reactions  . Penicillins Nausea And Vomiting    Has patient had a PCN reaction causing immediate rash, facial/tongue/throat swelling, SOB or lightheadedness with hypotension: yes rash Has patient had a PCN reaction causing severe rash involving mucus membranes or skin necrosis:no Has patient had a PCN reaction that required hospitalization no Has patient had a PCN reaction occurring within the last 10 years: yes If all of the above answers are "NO", then may proceed with Cephalosporin use.     Current antibiotics: Antibiotics Given (last 72 hours)    Date/Time Action Medication Dose Rate   05/08/16 0248 Given    efavirenz-emtricitabine-tenofovir (ATRIPLA) 600-200-300 MG per tablet 1 tablet 1 tablet    05/08/16 0249 Given   vancomycin (VANCOCIN) IVPB 1000 mg/200 mL premix 1,000 mg 200 mL/hr   05/08/16 0249 Given   meropenem (MERREM) 1 g in sodium chloride 0.9 % 100 mL IVPB 1 g 200 mL/hr   05/08/16 0934 Given   vancomycin (VANCOCIN) IVPB 1000 mg/200 mL premix 1,000 mg 200 mL/hr   05/08/16 1226 Given   meropenem (MERREM) IVPB SOLR 1 g 1 g 100 mL/hr      MEDICATIONS: . albuterol  2.5 mg Nebulization Q6H  . aspirin EC  81 mg Oral Daily  . diltiazem  180 mg Oral Daily  . efavirenz-emtricitabine-tenofovir  1 tablet Oral QHS  . enoxaparin (LOVENOX) injection  40 mg Subcutaneous Q24H  . insulin aspart  0-20 Units Subcutaneous TID WC  . insulin aspart  0-5 Units Subcutaneous QHS  . insulin glargine  10 Units Subcutaneous QHS  . meropenem  1 g Intravenous Q8H  . pravastatin  20 mg Oral Daily  . sodium chloride flush  3 mL Intravenous Q12H  . vancomycin  1,000 mg Intravenous Q8H    Review of Systems - 11 systems reviewed and negative per HPI   OBJECTIVE: Temp:  [99.3 F (37.4 C)-103.2 F (39.6 C)] 99.3 F (37.4 C) (03/16 1200) Pulse Rate:  [83-168] 102 (03/16 1200) Resp:  [18-40] 24 (03/16 0916) BP: (  130-188)/(64-100) 130/72 (03/16 1200) SpO2:  [72 %-99 %] 94 % (03/16 1303) Weight:  [120.2 kg (265 lb)-121.4 kg (267 lb 9.6 oz)] 121.4 kg (267 lb 9.6 oz) (03/16 0057) Physical Exam  Constitutional: He is oriented to person, place, and time. Diaphoretic He appears well-developed and well-nourished. No distress.  HENT: anicteric Mouth/Throat: Oropharynx is clear and moist. No oropharyngeal exudate.  Cardiovascular: Normal rate, regular rhythm and normal heart sounds. Pulmonary/Chest: good air movment, bibasialr rhonchi Abdominal: Soft. Bowel sounds are normal. He exhibits no distension but is obese, There is mild TTP RUQ and epigastrium Lymphadenopathy:  He has no cervical adenopathy.   Neurological: He is alert and oriented to person, place, and time.  Skin: Skin is warm and dry. No rash noted. No erythema.  Psychiatric: He has a normal mood and affect. His behavior is normal.   LABS: Results for orders placed or performed during the hospital encounter of 05/07/16 (from the past 48 hour(s))  Lipase, blood     Status: Abnormal   Collection Time: 05/07/16 12:59 PM  Result Value Ref Range   Lipase <10 (L) 11 - 51 U/L  Comprehensive metabolic panel     Status: Abnormal   Collection Time: 05/07/16 12:59 PM  Result Value Ref Range   Sodium 129 (L) 135 - 145 mmol/L   Potassium 4.0 3.5 - 5.1 mmol/L   Chloride 95 (L) 101 - 111 mmol/L   CO2 24 22 - 32 mmol/L   Glucose, Bld 301 (H) 65 - 99 mg/dL   BUN 14 6 - 20 mg/dL   Creatinine, Ser 1.12 0.61 - 1.24 mg/dL   Calcium 8.9 8.9 - 10.3 mg/dL   Total Protein 8.1 6.5 - 8.1 g/dL   Albumin 3.9 3.5 - 5.0 g/dL   AST 21 15 - 41 U/L   ALT 22 17 - 63 U/L   Alkaline Phosphatase 81 38 - 126 U/L   Total Bilirubin 1.3 (H) 0.3 - 1.2 mg/dL   GFR calc non Af Amer >60 >60 mL/min   GFR calc Af Amer >60 >60 mL/min    Comment: (NOTE) The eGFR has been calculated using the CKD EPI equation. This calculation has not been validated in all clinical situations. eGFR's persistently <60 mL/min signify possible Chronic Kidney Disease.    Anion gap 10 5 - 15  Urinalysis, Complete w Microscopic     Status: Abnormal   Collection Time: 05/07/16 12:59 PM  Result Value Ref Range   Color, Urine AMBER (A) YELLOW    Comment: BIOCHEMICALS MAY BE AFFECTED BY COLOR   APPearance CLEAR (A) CLEAR   Specific Gravity, Urine 1.033 (H) 1.005 - 1.030   pH 5.0 5.0 - 8.0   Glucose, UA >=500 (A) NEGATIVE mg/dL   Hgb urine dipstick MODERATE (A) NEGATIVE   Bilirubin Urine NEGATIVE NEGATIVE   Ketones, ur 80 (A) NEGATIVE mg/dL   Protein, ur 100 (A) NEGATIVE mg/dL   Nitrite NEGATIVE NEGATIVE   Leukocytes, UA NEGATIVE NEGATIVE   RBC / HPF 0-5 0 - 5 RBC/hpf   WBC,  UA 6-30 0 - 5 WBC/hpf   Bacteria, UA NONE SEEN NONE SEEN   Squamous Epithelial / LPF 0-5 (A) NONE SEEN   Mucous PRESENT   CK     Status: None   Collection Time: 05/07/16  3:07 PM  Result Value Ref Range   Total CK 252 49 - 397 U/L  CBC with Differential     Status: Abnormal   Collection Time: 05/07/16  3:07 PM  Result Value Ref Range   WBC 16.4 (H) 3.8 - 10.6 K/uL   RBC 5.04 4.40 - 5.90 MIL/uL   Hemoglobin 13.8 13.0 - 18.0 g/dL   HCT 40.6 40.0 - 52.0 %   MCV 80.6 80.0 - 100.0 fL   MCH 27.4 26.0 - 34.0 pg   MCHC 34.0 32.0 - 36.0 g/dL   RDW 14.7 (H) 11.5 - 14.5 %   Platelets 162 150 - 440 K/uL   Neutrophils Relative % 91 %   Neutro Abs 14.9 (H) 1.4 - 6.5 K/uL   Lymphocytes Relative 3 %   Lymphs Abs 0.5 (L) 1.0 - 3.6 K/uL   Monocytes Relative 6 %   Monocytes Absolute 0.9 0.2 - 1.0 K/uL   Eosinophils Relative 0 %   Eosinophils Absolute 0.0 0 - 0.7 K/uL   Basophils Relative 0 %   Basophils Absolute 0.1 0 - 0.1 K/uL  Lactic acid, plasma     Status: Abnormal   Collection Time: 05/07/16  9:11 PM  Result Value Ref Range   Lactic Acid, Venous 2.2 (HH) 0.5 - 1.9 mmol/L    Comment: CRITICAL RESULT CALLED TO, READ BACK BY AND VERIFIED WITH CASEY ROBERTS ON 05/07/16 AT [address] [facility]   Troponin I     Status: Abnormal   Collection Time: 05/07/16  9:11 PM  Result Value Ref Range   Troponin I 0.10 (HH) <0.03 ng/mL    Comment: CRITICAL RESULT CALLED TO, READ BACK BY AND VERIFIED WITH KENNY PATEL @ 3976 ON 05/07/2016 BY [facility]   Influenza panel by PCR (type A & B)     Status: None   Collection Time: 05/07/16  9:11 PM  Result Value Ref Range   Influenza A By PCR NEGATIVE NEGATIVE   Influenza B By PCR NEGATIVE NEGATIVE    Comment: (NOTE) The Xpert Xpress Flu assay is intended as an aid in the diagnosis of  influenza and should not be used as a sole basis for treatment.  This  assay is FDA approved for nasopharyngeal swab specimens only. Nasal  washings and aspirates are unacceptable for Xpert  Xpress Flu testing.   Blood Culture (routine x 2)     Status: None (Preliminary result)   Collection Time: 05/07/16  9:12 PM  Result Value Ref Range   Specimen Description BLOOD RIGHT HAND    Special Requests BOTTLES DRAWN AEROBIC AND ANAEROBIC  BCAV    Culture NO GROWTH < 12 HOURS    Report Status PENDING   Blood Culture (routine x 2)     Status: None (Preliminary result)   Collection Time: 05/07/16  9:12 PM  Result Value Ref Range   Specimen Description BLOOD LEFT ANTECUBITAL    Special Requests BOTTLES DRAWN AEROBIC AND ANAEROBIC BCHV    Culture NO GROWTH < 12 HOURS    Report Status PENDING   Lactic acid, plasma     Status: None   Collection Time: 05/07/16 11:34 PM  Result Value Ref Range   Lactic Acid, Venous 1.5 0.5 - 1.9 mmol/L  Troponin I     Status: Abnormal   Collection Time: 05/07/16 11:34 PM  Result Value Ref Range   Troponin I 0.09 (HH) <0.03 ng/mL    Comment: CRITICAL VALUE NOTED. VALUE IS CONSISTENT WITH PREVIOUSLY REPORTED/CALLED VALUE [facility]  Comprehensive metabolic panel     Status: Abnormal   Collection Time: 05/08/16  1:23 AM  Result Value Ref Range   Sodium 130 (L) 135 - 145  mmol/L   Potassium 4.0 3.5 - 5.1 mmol/L   Chloride 93 (L) 101 - 111 mmol/L   CO2 24 22 - 32 mmol/L   Glucose, Bld 305 (H) 65 - 99 mg/dL   BUN 18 6 - 20 mg/dL   Creatinine, Ser 1.48 (H) 0.61 - 1.24 mg/dL   Calcium 8.5 (L) 8.9 - 10.3 mg/dL   Total Protein 7.8 6.5 - 8.1 g/dL   Albumin 3.6 3.5 - 5.0 g/dL   AST 46 (H) 15 - 41 U/L   ALT 30 17 - 63 U/L   Alkaline Phosphatase 145 (H) 38 - 126 U/L   Total Bilirubin 2.4 (H) 0.3 - 1.2 mg/dL   GFR calc non Af Amer 58 (L) >60 mL/min   GFR calc Af Amer >60 >60 mL/min    Comment: (NOTE) The eGFR has been calculated using the CKD EPI equation. This calculation has not been validated in all clinical situations. eGFR's persistently <60 mL/min signify possible Chronic Kidney Disease.    Anion gap 13 5 - 15  CBC with Differential/Platelet      Status: Abnormal   Collection Time: 05/08/16  1:23 AM  Result Value Ref Range   WBC 12.5 (H) 3.8 - 10.6 K/uL   RBC 4.87 4.40 - 5.90 MIL/uL   Hemoglobin 13.0 13.0 - 18.0 g/dL   HCT 39.2 (L) 40.0 - 52.0 %   MCV 80.6 80.0 - 100.0 fL   MCH 26.7 26.0 - 34.0 pg   MCHC 33.2 32.0 - 36.0 g/dL   RDW 14.7 (H) 11.5 - 14.5 %   Platelets 171 150 - 440 K/uL   Neutrophils Relative % 97 %   Neutro Abs 12.1 (H) 1.4 - 6.5 K/uL   Lymphocytes Relative 2 %   Lymphs Abs 0.2 (L) 1.0 - 3.6 K/uL   Monocytes Relative 1 %   Monocytes Absolute 0.2 0.2 - 1.0 K/uL   Eosinophils Relative 0 %   Eosinophils Absolute 0.0 0 - 0.7 K/uL   Basophils Relative 0 %   Basophils Absolute 0.0 0 - 0.1 K/uL  Procalcitonin     Status: None   Collection Time: 05/08/16  1:23 AM  Result Value Ref Range   Procalcitonin 126.95 ng/mL    Comment:        Interpretation: PCT >= 10 ng/mL: Important systemic inflammatory response, almost exclusively due to severe bacterial sepsis or septic shock. (NOTE)         ICU PCT Algorithm               Non ICU PCT Algorithm    ----------------------------     ------------------------------         PCT < 0.25 ng/mL                 PCT < 0.1 ng/mL     Stopping of antibiotics            Stopping of antibiotics       strongly encouraged.               strongly encouraged.    ----------------------------     ------------------------------       PCT level decrease by               PCT < 0.25 ng/mL       >= 80% from peak PCT       OR PCT 0.25 - 0.5 ng/mL          Stopping of  antibiotics                                             encouraged.     Stopping of antibiotics           encouraged.    ----------------------------     ------------------------------       PCT level decrease by              PCT >= 0.25 ng/mL       < 80% from peak PCT        AND PCT >= 0.5 ng/mL             Continuing antibiotics                                              encouraged.       Continuing antibiotics             encouraged.    ----------------------------     ------------------------------     PCT level increase compared          PCT > 0.5 ng/mL         with peak PCT AND          PCT >= 0.5 ng/mL             Escalation of antibiotics                                          strongly encouraged.      Escalation of antibiotics        strongly encouraged.   Protime-INR     Status: Abnormal   Collection Time: 05/08/16  1:23 AM  Result Value Ref Range   Prothrombin Time 15.7 (H) 11.4 - 15.2 seconds   INR 1.24   APTT     Status: None   Collection Time: 05/08/16  1:23 AM  Result Value Ref Range   aPTT 29 24 - 36 seconds  Troponin I (q 6hr x 3)     Status: Abnormal   Collection Time: 05/08/16  1:23 AM  Result Value Ref Range   Troponin I 0.08 (HH) <0.03 ng/mL    Comment: CRITICAL VALUE NOTED. VALUE IS CONSISTENT WITH PREVIOUSLY REPORTED/CALLED VALUE BY CAF   Glucose, capillary     Status: Abnormal   Collection Time: 05/08/16  7:11 AM  Result Value Ref Range   Glucose-Capillary 264 (H) 65 - 99 mg/dL  Troponin I (q 6hr x 3)     Status: Abnormal   Collection Time: 05/08/16  8:23 AM  Result Value Ref Range   Troponin I 0.14 (HH) <0.03 ng/mL    Comment: CRITICAL VALUE NOTED. VALUE IS CONSISTENT WITH PREVIOUSLY REPORTED/CALLED VALUE. SGD  Glucose, capillary     Status: Abnormal   Collection Time: 05/08/16  9:13 AM  Result Value Ref Range   Glucose-Capillary 282 (H) 65 - 99 mg/dL   Comment 1 Notify RN    Comment 2 Document in Chart   Glucose, capillary     Status: Abnormal   Collection Time: 05/08/16 11:57 AM  Result Value Ref Range  Glucose-Capillary 326 (H) 65 - 99 mg/dL   Comment 1 Notify RN    No components found for: ESR, C REACTIVE PROTEIN MICRO: Recent Results (from the past 720 hour(s))  Blood Culture (routine x 2)     Status: None (Preliminary result)   Collection Time: 05/07/16  9:12 PM  Result Value Ref Range Status   Specimen Description BLOOD RIGHT HAND  Final    Special Requests BOTTLES DRAWN AEROBIC AND ANAEROBIC  BCAV  Final   Culture NO GROWTH < 12 HOURS  Final   Report Status PENDING  Incomplete  Blood Culture (routine x 2)     Status: None (Preliminary result)   Collection Time: 05/07/16  9:12 PM  Result Value Ref Range Status   Specimen Description BLOOD LEFT ANTECUBITAL  Final   Special Requests BOTTLES DRAWN AEROBIC AND ANAEROBIC [place]  Final   Culture NO GROWTH < 12 HOURS  Final   Report Status PENDING  Incomplete    IMAGING: Dg Chest 2 View  Result Date: 05/08/2016 CLINICAL DATA:  Shortness of breath and weakness EXAM: CHEST  2 VIEW COMPARISON:  None. FINDINGS: Cardiac shadow is within normal limits. The lungs are well aerated bilaterally. No focal infiltrate or sizable effusion is seen. Degenerative changes of thoracic spine are noted. IMPRESSION: No active cardiopulmonary disease. Electronically Signed   By: Inez Catalina M.D.   On: 05/08/2016 13:48   Dg Chest Portable 1 View  Result Date: 05/07/2016 CLINICAL DATA:  41 year old male with shortness of breath and fever. EXAM: PORTABLE CHEST 1 VIEW COMPARISON:  None. FINDINGS: The heart size and mediastinal contours are within normal limits. Both lungs are clear. The visualized skeletal structures are unremarkable. IMPRESSION: No active disease. Electronically Signed   By: Anner Crete M.D.   On: 05/07/2016 20:16    Assessment:   Ernan Bechtol is a 41 y.o. male with well controlled HIV, DM, Htn, asplenia,hx syphilis (treated) now with 4-5 days n/v, mild abd pain as well as fevers to 103. He is also increasingly hypoxic.  Interestingly he has had no diarrhea.  He had elevated wbc to 12 with a left shift on admit, UA neg, bcx pending. T bili and alk phos were mildly elevated on admit as was Cr. Troponin also mildly elevated. Procalcitonin elevated.  He was started on IVF, vanco and meropenem and feels a little better but had high fever spikes last night. Vomiting resolved with antiemetic.    One of my main concern is that he is asplenic. Also concerned regarding the elevated troponins and hypoxia although cxr with no CHF noted.  He is PCN allergic/ His HIV nor his Atripla are likely playing a role in this illness.  Recommendations Check cmet, lipase, BNP and Up Resp viral panel  Check RUQ [place] Consider CT scan if fevers persist Would continue current abx but can deescalate if improving (possibly to levofloxacin and flagyl if work up negative)  Thank you very much for allowing me to participate in the care of this patient. Please call with questions.   Cheral Marker. Ola Spurr, MD

## 2016-05-08 NOTE — Progress Notes (Signed)
Pharmacy Antibiotic Note  Dave Conrad is a 41 y.o. male with a h/o HIV admitted on 05/07/2016 with sepsisPharmacy has been consulted for vancomycin and meropenem dosing. Aztreonam and Levaquin initially ordered due to PCN allergy but reaction is N/V and itching with no rash.   Plan: After discussion with Dr. Emmit PomfretHugelmeyer, will change abx to vancomycin and meropenem.   Meropenem 1 g iv q 8 hours.   Vancomycin 2000 mg iv administered. Will order vancomycin 1000 mg iv q 8 hours and check a level with the 4th dose for a goal trough of 15-20 mcg/ml.   Height: 5\' 6"  (167.6 cm) Weight: 267 lb 9.6 oz (121.4 kg) IBW/kg (Calculated) : 63.8  Temp (24hrs), Avg:100.2 F (37.9 C), Min:97.6 F (36.4 C), Max:103 F (39.4 C)   Recent Labs Lab 05/07/16 1259 05/07/16 1507 05/07/16 2111 05/07/16 2334  WBC  --  16.4*  --   --   CREATININE 1.12  --   --   --   LATICACIDVEN  --   --  2.2* 1.5    Estimated Creatinine Clearance: 108.7 mL/min (by C-G formula based on SCr of 1.12 mg/dL).    Allergies  Allergen Reactions  . Penicillins Nausea And Vomiting    Has patient had a PCN reaction causing immediate rash, facial/tongue/throat swelling, SOB or lightheadedness with hypotension: yes rash Has patient had a PCN reaction causing severe rash involving mucus membranes or skin necrosis:no Has patient had a PCN reaction that required hospitalization no Has patient had a PCN reaction occurring within the last 10 years: yes If all of the above answers are "NO", then may proceed with Cephalosporin use.     Antimicrobials this admission: 0315 aztreonam and Levaquin x 1 Vancomycin 3/15 >> Meropenem 3/16 >>  Dose adjustments this admission:  Microbiology results: 3/15 BCx: pending 3/15 UCx: pending  Sputum: pending   Thank you for allowing pharmacy to be a part of this patient's care.  Luisa HartChristy, Delayla Hoffmaster D 05/08/2016 1:38 AM

## 2016-05-08 NOTE — Significant Event (Signed)
Rapid Response Event Note  Overview:    Patient's RN stated that patient was admitted for sepsis around midnight- not sure where the sepsis is coming from at this time- patient has had 2 temperatures of 102-103- patient given tylenol- now patient is tachycardic and BP is high and oxygen sats are in the 80s.  Initial Focused Assessment: Patient found alert and oriented- no complaint of pain- BP is 188-189 systolic and HR up to the 150's ST.  Patient is now sating 94% on 6 liters.    Interventions: Patient's RN gave scheduled dose of cardizem and lisinopril- also started PRN metoprolol of 5mg .  Patient's HR came down to 129 and BP 182 systolic.  RN stated that Dr. Hilton SinclairWeiting ordered Ibuprofen for temperature. Plan of Care (if not transferred): RN to continue to monitor HR and BP as medicines had just been given.  Patient stable at this time. Event Summary:   at      at          [facility],Dayshon Roback J

## 2016-05-08 NOTE — Progress Notes (Signed)
Initial Nutrition Assessment  DOCUMENTATION CODES:   Morbid obesity, Non-severe (moderate) malnutrition in context of acute illness/injury  INTERVENTION:  Provide Boost Breeze po TID, each supplement provides 250 kcal and 9 grams of protein.   After diet advanced patient would like snacks between meals.   NUTRITION DIAGNOSIS:   Inadequate oral intake related to poor appetite, nausea, vomiting as evidenced by per patient/family report, 2.2 percent weight loss over 1 week.  GOAL:   Patient will meet greater than or equal to 90% of their needs  MONITOR:   PO intake, Supplement acceptance, Diet advancement, Labs, Weight trends, I & O's  REASON FOR ASSESSMENT:   Malnutrition Screening Tool    ASSESSMENT:   41 year old male with PMHx of HIV, HTN, DM pres86ented with N/V found to have sepsis from unclear source.    Spoke with patient at bedside. Partner was sleeping at time of assessment. Patient reports his appetite was poor PTA for 4-5 days due to N/V. He denies any abdominal pain or constipation/diarrhea during that time. Patient reports that his last meal was Sunday evening (3/11) and that he did not have anything to eat after that (5 days). Patient reports his nausea is under control now and his appetite is improved. He reports he tolerating two meals CLD trays now and will be ready to eat food when diet advanced.   Reports UBW 272 lbs and that he has lost weight this past week. Last weight in chart ws 273.7 lbs on 03/24/2016. Patient has lost 6.1 lbs (2.2% body weight) over the past week per patient report, which is significant for time frame.   Medications reviewed and include: Novolog sliding scale TID with meals and daily at bedtime, Lantus 10 units daily, vancomycin, NS @ 100 ml/hr.  Labs reviewed: CBG 264-326, elevated Troponin, Sodium 130, Chloride 93, Creatinine 1.48.   Nutrition-Focused physical exam completed. Findings are no fat depletion, no muscle depletion, and no  edema.   Discussed with RN. Unsure if diet will be able to be advanced past CLD today.   Diet Order:  Diet clear liquid Room service appropriate? Yes; Fluid consistency: Thin  Skin:  Reviewed, no issues  Last BM:  05/07/2016  Height:   Ht Readings from Last 1 Encounters:  05/07/16 5\' 6"  (1.676 m)    Weight:   Wt Readings from Last 1 Encounters:  05/08/16 267 lb 9.6 oz (121.4 kg)    Ideal Body Weight:  64.5 kg  BMI:  Body mass index is 43.19 kg/m.  Estimated Nutritional Needs:   Kcal:  2280-2695 (MSJ x 1.1-1.3)  Protein:  120-145 grams (1-1.2 grams/kg)  Fluid:  2.2 L/day  EDUCATION NEEDS:   No education needs identified at this time  Helane RimaLeanne Zalma Channing, MS, RD, LDN Pager: [phone] After Hours Pager: [phone]

## 2016-05-08 NOTE — Progress Notes (Signed)
Chaplain was in the cafeteria and received a rapid response page. When Chaplain arrived at Pt.'s room, there were four nurses treating patient. Chaplain asked if there was a family member he could attend to and there was no family member. Chaplain will do a follow up visit to patient.

## 2016-05-08 NOTE — Progress Notes (Signed)
PHARMACY - PHYSICIAN COMMUNICATION CRITICAL VALUE ALERT - BLOOD CULTURE IDENTIFICATION (BCID)  Results for orders placed or performed during the hospital encounter of 05/07/16  Blood Culture ID Panel (Reflexed) (Collected: 05/07/2016  9:12 PM)  Result Value Ref Range   Enterococcus species NOT DETECTED NOT DETECTED   Listeria monocytogenes NOT DETECTED NOT DETECTED   Staphylococcus species NOT DETECTED NOT DETECTED   Staphylococcus aureus NOT DETECTED NOT DETECTED   Streptococcus species NOT DETECTED NOT DETECTED   Streptococcus agalactiae NOT DETECTED NOT DETECTED   Streptococcus pneumoniae NOT DETECTED NOT DETECTED   Streptococcus pyogenes NOT DETECTED NOT DETECTED   Acinetobacter baumannii NOT DETECTED NOT DETECTED   Enterobacteriaceae species DETECTED (A) NOT DETECTED   Enterobacter cloacae complex NOT DETECTED NOT DETECTED   Escherichia coli NOT DETECTED NOT DETECTED   Klebsiella oxytoca NOT DETECTED NOT DETECTED   Klebsiella pneumoniae NOT DETECTED NOT DETECTED   Proteus species DETECTED (A) NOT DETECTED   Serratia marcescens NOT DETECTED NOT DETECTED   Carbapenem resistance NOT DETECTED NOT DETECTED   Haemophilus influenzae NOT DETECTED NOT DETECTED   Neisseria meningitidis NOT DETECTED NOT DETECTED   Pseudomonas aeruginosa NOT DETECTED NOT DETECTED   Candida albicans NOT DETECTED NOT DETECTED   Candida glabrata NOT DETECTED NOT DETECTED   Candida krusei NOT DETECTED NOT DETECTED   Candida parapsilosis NOT DETECTED NOT DETECTED   Candida tropicalis NOT DETECTED NOT DETECTED   Recommend deescalating Abx from vancomycin and meropenem to ceftriaxone.   Name of physician (or Provider) Contacted: Dr. Cherlynn KaiserSainani   Changes to prescribed antibiotics required: No changes made at this time.   Gardner CandleSheema M Hilario Robarts, PharmD, BCPS Clinical Pharmacist 05/08/2016 6:37 PM

## 2016-05-08 NOTE — Progress Notes (Signed)
Dr. Hilton SinclairWeiting paged and responded. This Clinical research associatewriter notified Dr. Claiborne Billingshat this pt continues to have a temp of 103 despite motrin being given during a rapid response at 0730 and tylenol being given to pt at 0430. This Clinical research associatewriter received approval for pt to have another dose of tylenol 650mg  at this time.

## 2016-05-08 NOTE — Progress Notes (Signed)
Patient ID: Dave Conrad, male   DOB: 1975-09-20, 41 y.o.   MRN: [id number]  [employer] PROGRESS NOTE  Dave BenDarrick Eatherly WUJ:[id number] DOB: 1975-09-20 DOA: 05/07/2016 PCP: [facility]  HPI/Subjective: Patient having fever. Only slight cough and slight shortness of breath. No neck pain. No diarrhea.  Objective: Vitals:   05/08/16 0916 05/08/16 1200  BP: (!) 151/64 130/72  Pulse: (!) 111 (!) 102  Resp: (!) 24   Temp: (!) 101 F (38.3 C) 99.3 F (37.4 C)    Filed Weights   05/07/16 1253 05/07/16 2226 05/08/16 0057  Weight: 120.2 kg (265 lb) 120.2 kg (265 lb) 121.4 kg (267 lb 9.6 oz)    ROS: Review of Systems  Constitutional: Negative for chills and fever.  Eyes: Negative for blurred vision.  Respiratory: Positive for cough and shortness of breath.   Cardiovascular: Negative for chest pain.  Gastrointestinal: Negative for abdominal pain, constipation, diarrhea, nausea and vomiting.  Genitourinary: Negative for dysuria.  Musculoskeletal: Negative for joint pain.  Neurological: Negative for dizziness and headaches.   Exam: Physical Exam  Constitutional: He is oriented to person, place, and time.  HENT:  Nose: No mucosal edema.  Mouth/Throat: No oropharyngeal exudate or posterior oropharyngeal edema.  Eyes: Conjunctivae, EOM and lids are normal. Pupils are equal, round, and reactive to light.  Neck: No JVD present. Carotid bruit is not present. No edema present. No thyroid mass and no thyromegaly present.  Cardiovascular: S1 normal and S2 normal.  Exam reveals no gallop.   No murmur heard. Pulses:      Dorsalis pedis pulses are 2+ on the right side, and 2+ on the left side.  Respiratory: No respiratory distress. He has decreased breath sounds in the right lower field and the left lower field. He has wheezes in the left upper field. He has no rhonchi. He has no rales.  GI: Soft. Bowel sounds are normal. There is no tenderness.  Musculoskeletal:       Right  ankle: He exhibits swelling.       Left ankle: He exhibits swelling.  Lymphadenopathy:    He has no cervical adenopathy.  Neurological: He is alert and oriented to person, place, and time. No cranial nerve deficit.  Skin: Skin is warm. No rash noted. Nails show no clubbing.  Psychiatric: He has a normal mood and affect.      Data Reviewed: Basic Metabolic Panel:  Recent Labs Lab 05/07/16 1259 05/08/16 0123  NA 129* 130*  K 4.0 4.0  CL 95* 93*  CO2 24 24  GLUCOSE 301* 305*  BUN 14 18  CREATININE 1.12 1.48*  CALCIUM 8.9 8.5*   Liver Function Tests:  Recent Labs Lab 05/07/16 1259 05/08/16 0123  AST 21 46*  ALT 22 30  ALKPHOS 81 145*  BILITOT 1.3* 2.4*  PROT 8.1 7.8  ALBUMIN 3.9 3.6    Recent Labs Lab 05/07/16 1259  LIPASE <10*   CBC:  Recent Labs Lab 05/07/16 1507 05/08/16 0123  WBC 16.4* 12.5*  NEUTROABS 14.9* 12.1*  HGB 13.8 13.0  HCT 40.6 39.2*  MCV 80.6 80.6  PLT 162 171   Cardiac Enzymes:  Recent Labs Lab 05/07/16 1507 05/07/16 2111 05/07/16 2334 05/08/16 0123 05/08/16 0823 05/08/16 1348  CKTOTAL 252  --   --   --   --   --   TROPONINI  --  0.10* 0.09* 0.08* 0.14* 0.07*    CBG:  Recent Labs Lab 05/08/16 0711 05/08/16 0913 05/08/16 1157  GLUCAP 264* 282* 326*    Recent Results (from the past 240 hour(s))  Blood Culture (routine x 2)     Status: None (Preliminary result)   Collection Time: 05/07/16  9:12 PM  Result Value Ref Range Status   Specimen Description BLOOD RIGHT HAND  Final   Special Requests BOTTLES DRAWN AEROBIC AND ANAEROBIC  BCAV  Final   Culture NO GROWTH < 12 HOURS  Final   Report Status PENDING  Incomplete  Blood Culture (routine x 2)     Status: None (Preliminary result)   Collection Time: 05/07/16  9:12 PM  Result Value Ref Range Status   Specimen Description BLOOD LEFT ANTECUBITAL  Final   Special Requests BOTTLES DRAWN AEROBIC AND ANAEROBIC BCHV  Final   Culture NO GROWTH < 12 HOURS  Final   Report  Status PENDING  Incomplete     Studies: Dg Chest 2 View  Result Date: 05/08/2016 CLINICAL DATA:  Shortness of breath and weakness EXAM: CHEST  2 VIEW COMPARISON:  None. FINDINGS: Cardiac shadow is within normal limits. The lungs are well aerated bilaterally. No focal infiltrate or sizable effusion is seen. Degenerative changes of thoracic spine are noted. IMPRESSION: No active cardiopulmonary disease. Electronically Signed   By: Alcide Clever M.D.   On: 05/08/2016 13:48   Dg Chest Portable 1 View  Result Date: 05/07/2016 CLINICAL DATA:  42 year old male with shortness of breath and fever. EXAM: PORTABLE CHEST 1 VIEW COMPARISON:  None. FINDINGS: The heart size and mediastinal contours are within normal limits. Both lungs are clear. The visualized skeletal structures are unremarkable. IMPRESSION: No active disease. Electronically Signed   By: Elgie Collard M.D.   On: 05/07/2016 20:16    Scheduled Meds: . albuterol  2.5 mg Nebulization Q6H  . aspirin EC  81 mg Oral Daily  . diltiazem  180 mg Oral Daily  . efavirenz-emtricitabine-tenofovir  1 tablet Oral QHS  . enoxaparin (LOVENOX) injection  40 mg Subcutaneous Q24H  . insulin aspart  0-20 Units Subcutaneous TID WC  . insulin aspart  0-5 Units Subcutaneous QHS  . insulin glargine  10 Units Subcutaneous QHS  . meropenem  1 g Intravenous Q8H  . pravastatin  20 mg Oral Daily  . sodium chloride flush  3 mL Intravenous Q12H  . vancomycin  1,000 mg Intravenous Q8H   Continuous Infusions: . sodium chloride 100 mL/hr at 05/08/16 0105    Assessment/Plan:  1. Clinical sepsis unclear etiology could be respiratory in nature with the patient's hypoxia. Pro-calcitonin very high. Repeat chest x-ray negative. Patient on vancomycin and meropenem for now. Case discussed with infectious disease to see the patient. 2. Acute respiratory failure with hypoxia. Since the patient's CD4 count is good, PCP is not the etiology. Continue oxygen  supplementation. 3. HIV disease. Continue his usual medication 4. Acute kidney injury. Continue IV fluid hydration and hold lisinopril for now. 5. Hyperlipidemia unspecified on pravastatin 6. Essential hypertension on diltiazem  7. Type 2 diabetes mellitus with hyperglycemia. Hold oral medications and start low-dose Lantus and sliding scale. 8. Elevated bilirubin. Check an ultrasound of the right upper quadrant tomorrow morning 9. Elevated troponin demand ischemia from sepsis  Code Status:     Code Status Orders        Start     Ordered   05/08/16 0043  Full code  Continuous     05/08/16 0043    Code Status History    Date Active Date Inactive Code Status Order  ID Comments User Context   This patient has a current code status but no historical code status.     Disposition Plan: To be determined  Consultants:  Infectious disease  Antibiotics:  Vancomycin  Meropenem  Time spent: 28 minutes  Alford Highland  [employer]

## 2016-05-08 NOTE — Progress Notes (Signed)
Patient desat to 78%. Patient not in any distress but feeling a little SOB. Patient now 5LO2 at 95%. Patient tropinin also .15, MD aware and will place orders if needed.  Dave HeckMelanie Wadsworth Skolnick

## 2016-05-08 NOTE — Progress Notes (Signed)
Patient weaned to 2LO2, 97% O2 saturation  Harvie HeckMelanie Maevis Mumby, RN

## 2016-05-08 NOTE — Progress Notes (Signed)
Patient had rapid response called at 7 am, patient had fever 103, BP 188/100, Pulse up to 150's, diaphoretic, tachypenic, desat 78% on room air . Patient was given BP meds and motrin for fever, metoprolol for HR. Patient current temp 99.3, O2 saturation 94% on 5LO2. Patient no longer diaphoretic. No nausea or vomiting. No pain. Patient resting at this time.   Harvie HeckMelanie Ellijah Leffel, RN

## 2016-05-09 ENCOUNTER — Encounter: Payer: Self-pay | Admitting: Anesthesiology

## 2016-05-09 ENCOUNTER — Inpatient Hospital Stay: Payer: Managed Care, Other (non HMO) | Admitting: Certified Registered Nurse Anesthetist

## 2016-05-09 ENCOUNTER — Encounter
Admission: EM | Disposition: A | Discharge status: Home / Self Care | Payer: Self-pay | Source: Home / Self Care | Attending: Internal Medicine

## 2016-05-09 ENCOUNTER — Inpatient Hospital Stay: Payer: Managed Care, Other (non HMO)

## 2016-05-09 ENCOUNTER — Inpatient Hospital Stay (HOSPITAL_COMMUNITY)
Admit: 2016-05-09 | Discharge: 2016-05-09 | Disposition: A | Discharge status: Home / Self Care | Payer: Managed Care, Other (non HMO) | Attending: Internal Medicine | Admitting: Internal Medicine

## 2016-05-09 DIAGNOSIS — N358 Other urethral stricture: Secondary | ICD-10-CM

## 2016-05-09 DIAGNOSIS — E440 Moderate protein-calorie malnutrition: Secondary | ICD-10-CM | POA: Insufficient documentation

## 2016-05-09 DIAGNOSIS — N132 Hydronephrosis with renal and ureteral calculous obstruction: Secondary | ICD-10-CM

## 2016-05-09 DIAGNOSIS — J9601 Acute respiratory failure with hypoxia: Secondary | ICD-10-CM

## 2016-05-09 DIAGNOSIS — R0600 Dyspnea, unspecified: Secondary | ICD-10-CM

## 2016-05-09 DIAGNOSIS — A419 Sepsis, unspecified organism: Secondary | ICD-10-CM

## 2016-05-09 HISTORY — PX: CYSTOSCOPY WITH STENT PLACEMENT: SHX5790

## 2016-05-09 LAB — URINE CULTURE: CULTURE: NO GROWTH

## 2016-05-09 LAB — BLOOD GAS, ARTERIAL
ACID-BASE DEFICIT: 1.7 mmol/L (ref 0.0–2.0)
BICARBONATE: 25.2 mmol/L (ref 20.0–28.0)
FIO2: 0.8
LHR: 18 {breaths}/min
MECHVT: 500 mL
O2 SAT: 95.4 %
PATIENT TEMPERATURE: 37
PCO2 ART: 50 mmHg — AB (ref 32.0–48.0)
PEEP: 5 cmH2O
PO2 ART: 85 mmHg (ref 83.0–108.0)
pH, Arterial: 7.31 — ABNORMAL LOW (ref 7.350–7.450)

## 2016-05-09 LAB — LACTIC ACID, PLASMA
LACTIC ACID, VENOUS: 1.2 mmol/L (ref 0.5–1.9)
Lactic Acid, Venous: 1.2 mmol/L (ref 0.5–1.9)

## 2016-05-09 LAB — RESPIRATORY PANEL BY PCR
Adenovirus: NOT DETECTED
BORDETELLA PERTUSSIS-RVPCR: NOT DETECTED
CORONAVIRUS 229E-RVPPCR: NOT DETECTED
CORONAVIRUS HKU1-RVPPCR: NOT DETECTED
Chlamydophila pneumoniae: NOT DETECTED
Coronavirus NL63: NOT DETECTED
Coronavirus OC43: NOT DETECTED
Influenza A: NOT DETECTED
Influenza B: NOT DETECTED
METAPNEUMOVIRUS-RVPPCR: NOT DETECTED
MYCOPLASMA PNEUMONIAE-RVPPCR: NOT DETECTED
PARAINFLUENZA VIRUS 2-RVPPCR: NOT DETECTED
Parainfluenza Virus 1: NOT DETECTED
Parainfluenza Virus 3: NOT DETECTED
Parainfluenza Virus 4: NOT DETECTED
Respiratory Syncytial Virus: NOT DETECTED
Rhinovirus / Enterovirus: NOT DETECTED

## 2016-05-09 LAB — BASIC METABOLIC PANEL
Anion gap: 10 (ref 5–15)
BUN: 17 mg/dL (ref 6–20)
CHLORIDE: 96 mmol/L — AB (ref 101–111)
CO2: 24 mmol/L (ref 22–32)
CREATININE: 1.35 mg/dL — AB (ref 0.61–1.24)
Calcium: 8.4 mg/dL — ABNORMAL LOW (ref 8.9–10.3)
GFR calc Af Amer: >60 mL/min (ref >60)
GFR calc non Af Amer: >60 mL/min (ref >60)
Glucose, Bld: 252 mg/dL — ABNORMAL HIGH (ref 65–99)
Potassium: 3.5 mmol/L (ref 3.5–5.1)
SODIUM: 130 mmol/L — AB (ref 135–145)

## 2016-05-09 LAB — MAGNESIUM: MAGNESIUM: 2.2 mg/dL (ref 1.7–2.4)

## 2016-05-09 LAB — MRSA PCR SCREENING: MRSA by PCR: NEGATIVE

## 2016-05-09 LAB — GLUCOSE, CAPILLARY
GLUCOSE-CAPILLARY: 246 mg/dL — AB (ref 65–99)
GLUCOSE-CAPILLARY: 247 mg/dL — AB (ref 65–99)
GLUCOSE-CAPILLARY: 261 mg/dL — AB (ref 65–99)
Glucose-Capillary: 225 mg/dL — ABNORMAL HIGH (ref 65–99)

## 2016-05-09 LAB — CBC
HCT: 40.4 % (ref 40.0–52.0)
HEMOGLOBIN: 13.5 g/dL (ref 13.0–18.0)
MCH: 26.9 pg (ref 26.0–34.0)
MCHC: 33.3 g/dL (ref 32.0–36.0)
MCV: 80.6 fL (ref 80.0–100.0)
Platelets: 172 10*3/uL (ref 150–440)
RBC: 5.01 MIL/uL (ref 4.40–5.90)
RDW: 15.2 % — ABNORMAL HIGH (ref 11.5–14.5)
WBC: 23.8 10*3/uL — ABNORMAL HIGH (ref 3.8–10.6)

## 2016-05-09 LAB — ECHOCARDIOGRAM COMPLETE
HEIGHTINCHES: 66 in
Weight: 4281.6 oz

## 2016-05-09 LAB — HEPARIN LEVEL (UNFRACTIONATED)
Heparin Unfractionated: 0.11 IU/mL — ABNORMAL LOW (ref 0.30–0.70)
Heparin Unfractionated: 0.13 IU/mL — ABNORMAL LOW (ref 0.30–0.70)

## 2016-05-09 LAB — PROTIME-INR
INR: 1.21
Prothrombin Time: 15.4 seconds — ABNORMAL HIGH (ref 11.4–15.2)

## 2016-05-09 LAB — PHOSPHORUS: Phosphorus: 3.8 mg/dL (ref 2.5–4.6)

## 2016-05-09 LAB — FIBRINOGEN: Fibrinogen: >750 mg/dL — ABNORMAL HIGH (ref 210–475)

## 2016-05-09 LAB — HEMOGLOBIN A1C
HEMOGLOBIN A1C: 7.5 % — AB (ref 4.8–5.6)
MEAN PLASMA GLUCOSE: 169 mg/dL

## 2016-05-09 LAB — CREATININE, SERUM: CREATININE: 1.43 mg/dL — AB (ref 0.61–1.24)

## 2016-05-09 LAB — PROCALCITONIN: PROCALCITONIN: 102.26 ng/mL

## 2016-05-09 LAB — TRIGLYCERIDES: Triglycerides: 505 mg/dL — ABNORMAL HIGH (ref <150)

## 2016-05-09 LAB — VANCOMYCIN, TROUGH: Vancomycin Tr: 14 ug/mL — ABNORMAL LOW (ref 15–20)

## 2016-05-09 SURGERY — CYSTOSCOPY, WITH STENT INSERTION
Anesthesia: General | Laterality: Right | Wound class: Contaminated

## 2016-05-09 MED ORDER — IOPAMIDOL (ISOVUE-300) INJECTION 61%
15.0000 mL | INTRAVENOUS | Status: AC
  Administered 2016-05-09 (×2): 15 mL via ORAL

## 2016-05-09 MED ORDER — SENNOSIDES 8.8 MG/5ML PO SYRP
5.0000 mL | ORAL_SOLUTION | Freq: Two times a day (BID) | ORAL | Status: DC | PRN
  Filled 2016-05-09: qty 5

## 2016-05-09 MED ORDER — SUCCINYLCHOLINE CHLORIDE 20 MG/ML IJ SOLN
INTRAMUSCULAR | Status: AC
  Filled 2016-05-09: qty 1

## 2016-05-09 MED ORDER — PROPOFOL 10 MG/ML IV BOLUS
INTRAVENOUS | Status: AC
  Filled 2016-05-09: qty 20

## 2016-05-09 MED ORDER — ALTEPLASE 2 MG IJ SOLR: 2.0000 mg | Freq: Once | INTRAMUSCULAR | Status: DC

## 2016-05-09 MED ORDER — LABETALOL HCL 5 MG/ML IV SOLN
INTRAVENOUS | Status: AC
  Filled 2016-05-09: qty 4

## 2016-05-09 MED ORDER — INSULIN ASPART 100 UNIT/ML ~~LOC~~ SOLN
0.0000 [IU] | SUBCUTANEOUS | Status: DC
  Administered 2016-05-09: 8 [IU] via SUBCUTANEOUS
  Administered 2016-05-10: 11 [IU] via SUBCUTANEOUS
  Administered 2016-05-10 (×2): 8 [IU] via SUBCUTANEOUS
  Filled 2016-05-09 (×2): qty 8
  Filled 2016-05-09: qty 11
  Filled 2016-05-09: qty 8

## 2016-05-09 MED ORDER — HEPARIN (PORCINE) IN NACL 100-0.45 UNIT/ML-% IJ SOLN
2000.0000 [IU]/h | INTRAMUSCULAR | Status: DC
  Administered 2016-05-09: 2000 [IU]/h via INTRAVENOUS

## 2016-05-09 MED ORDER — DEXAMETHASONE SODIUM PHOSPHATE 10 MG/ML IJ SOLN
INTRAMUSCULAR | Status: AC
  Filled 2016-05-09: qty 1

## 2016-05-09 MED ORDER — FENTANYL CITRATE (PF) 100 MCG/2ML IJ SOLN
INTRAMUSCULAR | Status: DC | PRN
Start: 2016-05-09 — End: 2016-05-09
  Administered 2016-05-09 (×2): 50 ug via INTRAVENOUS

## 2016-05-09 MED ORDER — FENTANYL CITRATE (PF) 100 MCG/2ML IJ SOLN
INTRAMUSCULAR | Status: AC
  Filled 2016-05-09: qty 2

## 2016-05-09 MED ORDER — AZITHROMYCIN 250 MG PO TABS
250.0000 mg | ORAL_TABLET | Freq: Every day | ORAL | Status: DC
  Administered 2016-05-10 – 2016-05-11 (×2): 250 mg via ORAL
  Filled 2016-05-09 (×2): qty 1

## 2016-05-09 MED ORDER — ROCURONIUM BROMIDE 100 MG/10ML IV SOLN
INTRAVENOUS | Status: DC | PRN
  Administered 2016-05-09: 30 mg via INTRAVENOUS
  Administered 2016-05-09: 20 mg via INTRAVENOUS

## 2016-05-09 MED ORDER — ONDANSETRON HCL 4 MG/2ML IJ SOLN
INTRAMUSCULAR | Status: AC
  Filled 2016-05-09: qty 2

## 2016-05-09 MED ORDER — LIDOCAINE HCL (PF) 2 % IJ SOLN
INTRAMUSCULAR | Status: AC
Start: 2016-05-09 — End: 2016-05-09
  Filled 2016-05-09: qty 2

## 2016-05-09 MED ORDER — SODIUM CHLORIDE 0.9 % IV SOLN
INTRAVENOUS | Status: DC | PRN
  Administered 2016-05-09: 18:00:00 via INTRAVENOUS

## 2016-05-09 MED ORDER — MIDAZOLAM HCL 2 MG/2ML IJ SOLN
2.0000 mg | INTRAMUSCULAR | Status: DC | PRN
  Administered 2016-05-09: 2 mg via INTRAVENOUS
  Filled 2016-05-09 (×2): qty 2

## 2016-05-09 MED ORDER — FENTANYL 2500MCG IN NS 250ML (10MCG/ML) PREMIX INFUSION
25.0000 ug/h | INTRAVENOUS | Status: DC
Start: 2016-05-09 — End: 2016-05-10
  Administered 2016-05-09: 100 ug/h via INTRAVENOUS
  Administered 2016-05-10: 400 ug/h via INTRAVENOUS
  Filled 2016-05-09 (×2): qty 250

## 2016-05-09 MED ORDER — CHLORHEXIDINE GLUCONATE 0.12% ORAL RINSE (MEDLINE KIT)
15.0000 mL | Freq: Two times a day (BID) | OROMUCOSAL | Status: DC
  Administered 2016-05-09 – 2016-05-10 (×2): 15 mL via OROMUCOSAL

## 2016-05-09 MED ORDER — PROPOFOL 10 MG/ML IV BOLUS
INTRAVENOUS | Status: DC | PRN
  Administered 2016-05-09: 200 mg via INTRAVENOUS

## 2016-05-09 MED ORDER — ORAL CARE MOUTH RINSE
15.0000 mL | Freq: Four times a day (QID) | OROMUCOSAL | Status: DC
  Administered 2016-05-10 (×2): 15 mL via OROMUCOSAL

## 2016-05-09 MED ORDER — MIDAZOLAM HCL 2 MG/2ML IJ SOLN
INTRAMUSCULAR | Status: DC | PRN
  Administered 2016-05-09: 2 mg via INTRAVENOUS

## 2016-05-09 MED ORDER — FENTANYL CITRATE (PF) 100 MCG/2ML IJ SOLN
100.0000 ug | Freq: Once | INTRAMUSCULAR | Status: AC
  Administered 2016-05-09: 100 ug via INTRAVENOUS

## 2016-05-09 MED ORDER — HEPARIN BOLUS VIA INFUSION
2800.0000 [IU] | Freq: Once | INTRAVENOUS | Status: AC
  Administered 2016-05-09: 2800 [IU] via INTRAVENOUS
  Filled 2016-05-09: qty 2800

## 2016-05-09 MED ORDER — FUROSEMIDE 10 MG/ML IJ SOLN
40.0000 mg | Freq: Once | INTRAMUSCULAR | Status: AC
  Administered 2016-05-09: 40 mg via INTRAVENOUS
  Filled 2016-05-09: qty 4

## 2016-05-09 MED ORDER — HEPARIN BOLUS VIA INFUSION
3500.0000 [IU] | Freq: Once | INTRAVENOUS | Status: AC
  Administered 2016-05-09: 3500 [IU] via INTRAVENOUS
  Filled 2016-05-09: qty 3500

## 2016-05-09 MED ORDER — MIDAZOLAM HCL 2 MG/2ML IJ SOLN
INTRAMUSCULAR | Status: AC
  Filled 2016-05-09: qty 2

## 2016-05-09 MED ORDER — SUCCINYLCHOLINE CHLORIDE 20 MG/ML IJ SOLN
INTRAMUSCULAR | Status: DC | PRN
  Administered 2016-05-09: 100 mg via INTRAVENOUS

## 2016-05-09 MED ORDER — ONDANSETRON HCL 4 MG/2ML IJ SOLN
INTRAMUSCULAR | Status: DC | PRN
  Administered 2016-05-09: 4 mg via INTRAVENOUS

## 2016-05-09 MED ORDER — AZITHROMYCIN 500 MG PO TABS
500.0000 mg | ORAL_TABLET | Freq: Every day | ORAL | Status: AC
  Administered 2016-05-09: 500 mg via ORAL
  Filled 2016-05-09: qty 1

## 2016-05-09 MED ORDER — DEXAMETHASONE SODIUM PHOSPHATE 10 MG/ML IJ SOLN
INTRAMUSCULAR | Status: DC | PRN
  Administered 2016-05-09: 10 mg via INTRAVENOUS

## 2016-05-09 MED ORDER — IPRATROPIUM-ALBUTEROL 0.5-2.5 (3) MG/3ML IN SOLN
3.0000 mL | Freq: Four times a day (QID) | RESPIRATORY_TRACT | Status: DC
  Administered 2016-05-09 – 2016-05-11 (×7): 3 mL via RESPIRATORY_TRACT
  Filled 2016-05-09 (×7): qty 3

## 2016-05-09 MED ORDER — PROPOFOL 1000 MG/100ML IV EMUL
0.0000 ug/kg/min | INTRAVENOUS | Status: DC
Start: 2016-05-09 — End: 2016-05-09
  Administered 2016-05-09: 10 ug/kg/min via INTRAVENOUS
  Filled 2016-05-09: qty 100

## 2016-05-09 MED ORDER — MIDAZOLAM HCL 2 MG/2ML IJ SOLN
2.0000 mg | INTRAMUSCULAR | Status: DC | PRN
  Administered 2016-05-09: 2 mg via INTRAVENOUS

## 2016-05-09 MED ORDER — PANTOPRAZOLE SODIUM 40 MG IV SOLR
40.0000 mg | Freq: Every day | INTRAVENOUS | Status: DC
  Administered 2016-05-09: 40 mg via INTRAVENOUS
  Filled 2016-05-09: qty 40

## 2016-05-09 MED ORDER — BISACODYL 10 MG RE SUPP: 10.0000 mg | Freq: Every day | RECTAL | Status: DC | PRN

## 2016-05-09 MED ORDER — ROCURONIUM BROMIDE 50 MG/5ML IV SOLN
INTRAVENOUS | Status: AC
  Filled 2016-05-09: qty 1

## 2016-05-09 MED ORDER — LABETALOL HCL 5 MG/ML IV SOLN
INTRAVENOUS | Status: DC | PRN
  Administered 2016-05-09: 10 mg via INTRAVENOUS

## 2016-05-09 MED ORDER — LIDOCAINE HCL (CARDIAC) 20 MG/ML IV SOLN
INTRAVENOUS | Status: DC | PRN
  Administered 2016-05-09: 100 mg via INTRAVENOUS

## 2016-05-09 MED ORDER — DEXMEDETOMIDINE HCL IN NACL 200 MCG/50ML IV SOLN
0.4000 ug/kg/h | INTRAVENOUS | Status: DC
  Administered 2016-05-09: 0.5 ug/kg/h via INTRAVENOUS
  Administered 2016-05-10: 0.6 ug/kg/h via INTRAVENOUS
  Administered 2016-05-10: 0.5 ug/kg/h via INTRAVENOUS
  Administered 2016-05-10: 0.6 ug/kg/h via INTRAVENOUS
  Filled 2016-05-09 (×4): qty 50

## 2016-05-09 MED ORDER — FENTANYL BOLUS VIA INFUSION
50.0000 ug | INTRAVENOUS | Status: DC | PRN
  Filled 2016-05-09: qty 100

## 2016-05-09 SURGICAL SUPPLY — 20 items
BAG DRAIN CYSTO-URO LG1000N (MISCELLANEOUS) ×2 IMPLANT
CATH FOL 2WAY LX 18X5 (CATHETERS) ×2 IMPLANT
CATH URETL 5X70 OPEN END (CATHETERS) ×2 IMPLANT
GLOVE BIO SURGEON STRL SZ7.5 (GLOVE) ×6 IMPLANT
GOWN STRL REUS W/ TWL LRG LVL3 (GOWN DISPOSABLE) ×2 IMPLANT
GOWN STRL REUS W/TWL LRG LVL3 (GOWN DISPOSABLE) ×2
PACK CYSTO AR (MISCELLANEOUS) ×2 IMPLANT
PREP PVP WINGED SPONGE (MISCELLANEOUS) ×2 IMPLANT
SENSORWIRE 0.038 NOT ANGLED (WIRE) ×2
SET CYSTO W/LG BORE CLAMP LF (SET/KITS/TRAYS/PACK) ×2 IMPLANT
SOL .9 NS 3000ML IRR  AL (IV SOLUTION) ×1
SOL .9 NS 3000ML IRR UROMATIC (IV SOLUTION) ×1 IMPLANT
SOL PREP PVP 2OZ (MISCELLANEOUS) ×2
SOLUTION PREP PVP 2OZ (MISCELLANEOUS) ×1 IMPLANT
STENT URET 6FRX24 CONTOUR (STENTS) IMPLANT
STENT URET 6FRX26 CONTOUR (STENTS) ×2 IMPLANT
SURGILUBE 2OZ TUBE FLIPTOP (MISCELLANEOUS) ×2 IMPLANT
SYR 10ML LL (SYRINGE) ×2 IMPLANT
WATER STERILE IRR 1000ML POUR (IV SOLUTION) ×2 IMPLANT
WIRE SENSOR 0.038 NOT ANGLED (WIRE) ×1 IMPLANT

## 2016-05-09 NOTE — Anesthesia Procedure Notes (Signed)
Procedure Name: Intubation Date/Time: 05/09/2016 6:21 PM Performed by: Ginger CarneMICHELET, Amelya Mabry Pre-anesthesia Checklist: Patient identified, Emergency Drugs available, Suction available, Patient being monitored and Timeout performed Patient Re-evaluated:Patient Re-evaluated prior to inductionOxygen Delivery Method: Circle system utilized Preoxygenation: Pre-oxygenation with 100% oxygen Intubation Type: IV induction, Rapid sequence and Cricoid Pressure applied Ventilation: Mask ventilation with difficulty, Oral airway inserted - appropriate to patient size and Two handed mask ventilation required Laryngoscope Size: McGraph and 3 Grade View: Grade I Tube type: Oral Tube size: 7.5 mm Number of attempts: 2 Airway Equipment and Method: Stylet,  Video-laryngoscopy and Bougie stylet Placement Confirmation: ETT inserted through vocal cords under direct vision and positive ETCO2 Secured at: 23 cm Dental Injury: Teeth and Oropharynx as per pre-operative assessment  Difficulty Due To: Difficulty was anticipated, Difficult Airway- due to large tongue and Difficult Airway- due to limited oral opening Future Recommendations: Recommend- induction with short-acting agent, and alternative techniques readily available Comments: DVL x1 with Mcgrath esophageal intubation. Ett removed Pt desaturated in to 60's  2 hand mask ventilation with oral airway  Saturation to 97. Dvl by Dr Karlton LemonKarenz unable to pass ETT bougie inserted ETT placed over and thru vocal cords. + ETCO2 + BBS O2 saturation in 50's  Ambu ventilation saturation up to 93 placed on vent.

## 2016-05-09 NOTE — Anesthesia Post-op Follow-up Note (Cosign Needed)
Anesthesia QCDR form completed.        

## 2016-05-09 NOTE — Progress Notes (Addendum)
Patient ID: Dave Conrad, male   DOB: 05/25/1975, 41 y.o.   MRN: [id number]  Case discussed with Dr Marlou PorchHerrick [facility] and the patient will likely need a stent.  Spoke with nursing staff that patient did not eat but did drink some sips of water. Heparin drip stopped.  Spoke with Dr Marlou PorchHerrick again  About patient only drinking some water and patient can go to OR sooner.  Spoke with Patient that he has sepsis (infection in the blood), pneumonia and obstructing kidney stone that need a procedure tonight.  Patient understands.  Also Spoke with Dr Levada SchillingSummers [facility] specialist) about plan of care  Dr Renae GlossWieting 30 minutes coordination of care

## 2016-05-09 NOTE — Progress Notes (Signed)
Patient ID: Dave Conrad, male   DOB: May 30, 1975, 41 y.o.   MRN: [id number]   [employer] PROGRESS NOTE  Adolf Ormiston FAO:[id number] DOB: 08-04-1975 DOA: 05/07/2016 PCP: [facility]  HPI/Subjective: Patient was transferred to the ICU for worsening respiratory status. Patient still having fever. He has a slight headache. Slight shortness of breath and slight cough.  Objective: Vitals:   05/09/16 1100 05/09/16 1200  BP: (!) 163/90 (!) 160/102  Pulse: 96 86  Resp: (!) 34 (!) 28  Temp:      Filed Weights   05/07/16 1253 05/07/16 2226 05/08/16 0057  Weight: 120.2 kg (265 lb) 120.2 kg (265 lb) 121.4 kg (267 lb 9.6 oz)    ROS: Review of Systems  Constitutional: Negative for chills and fever.  Eyes: Negative for blurred vision.  Respiratory: Positive for cough and shortness of breath.   Cardiovascular: Negative for chest pain.  Gastrointestinal: Negative for abdominal pain, constipation, diarrhea, nausea and vomiting.  Genitourinary: Negative for dysuria.  Musculoskeletal: Negative for joint pain.  Neurological: Positive for headaches. Negative for dizziness.   Exam: Physical Exam  Constitutional: He is oriented to person, place, and time.  HENT:  Nose: No mucosal edema.  Mouth/Throat: No oropharyngeal exudate or posterior oropharyngeal edema.  Eyes: Conjunctivae, EOM and lids are normal. Pupils are equal, round, and reactive to light.  Neck: No JVD present. Carotid bruit is not present. No edema present. No thyroid mass and no thyromegaly present.  Cardiovascular: S1 normal and S2 normal.  Exam reveals no gallop.   No murmur heard. Pulses:      Dorsalis pedis pulses are 2+ on the right side, and 2+ on the left side.  Respiratory: No respiratory distress. He has decreased breath sounds in the right middle field, the right lower field, the left middle field and the left lower field. He has wheezes in the left upper field. He has no rhonchi. He has no rales.  GI:  Soft. Bowel sounds are normal. There is no tenderness.  Musculoskeletal:       Right ankle: He exhibits swelling.       Left ankle: He exhibits swelling.  Lymphadenopathy:    He has no cervical adenopathy.  Neurological: He is alert and oriented to person, place, and time. No cranial nerve deficit.  Skin: Skin is warm. No rash noted. Nails show no clubbing.  Psychiatric: He has a normal mood and affect.      Data Reviewed: Basic Metabolic Panel:  Recent Labs Lab 05/07/16 1259 05/08/16 0123 05/08/16 1640 05/09/16 0512  NA 129* 130* 130*  --   K 4.0 4.0 3.8  --   CL 95* 93* 97*  --   CO2 24 24 23   --   GLUCOSE 301* 305* 266*  --   BUN 14 18 17   --   CREATININE 1.12 1.48* 1.36* 1.43*  CALCIUM 8.9 8.5* 8.5*  --    Liver Function Tests:  Recent Labs Lab 05/07/16 1259 05/08/16 0123 05/08/16 1640  AST 21 46* 45*  ALT 22 30 32  ALKPHOS 81 145* 111  BILITOT 1.3* 2.4* 3.2*  PROT 8.1 7.8 7.3  ALBUMIN 3.9 3.6 3.3*    Recent Labs Lab 05/07/16 1259 05/08/16 1640  LIPASE <10* <10*   CBC:  Recent Labs Lab 05/07/16 1507 05/08/16 0123  WBC 16.4* 12.5*  NEUTROABS 14.9* 12.1*  HGB 13.8 13.0  HCT 40.6 39.2*  MCV 80.6 80.6  PLT 162 171   Cardiac Enzymes:  Recent Labs Lab 05/07/16 1507 05/07/16 2111 05/07/16 2334 05/08/16 0123 05/08/16 0823 05/08/16 1348  CKTOTAL 252  --   --   --   --   --   TROPONINI  --  0.10* 0.09* 0.08* 0.14* 0.07*    CBG:  Recent Labs Lab 05/08/16 1157 05/08/16 1643 05/08/16 2135 05/09/16 0700 05/09/16 1144  GLUCAP 326* 270* 196* 246* 225*    Recent Results (from the past 240 hour(s))  Urine culture     Status: None   Collection Time: 05/07/16 12:59 PM  Result Value Ref Range Status   Specimen Description URINE, RANDOM  Final   Special Requests NONE  Final   Culture   Final    NO GROWTH Performed at [facility], [address], [place], [place] [address]    Report Status 05/09/2016 FINAL  Final  Blood Culture  (routine x 2)     Status: None (Preliminary result)   Collection Time: 05/07/16  9:12 PM  Result Value Ref Range Status   Specimen Description BLOOD RIGHT HAND  Final   Special Requests BOTTLES DRAWN AEROBIC AND ANAEROBIC  BCAV  Final   Culture  Setup Time   Final    GRAM NEGATIVE RODS IN BOTH AEROBIC AND ANAEROBIC BOTTLES CRITICAL VALUE NOTED.  VALUE IS CONSISTENT WITH PREVIOUSLY REPORTED AND CALLED VALUE.    Culture GRAM NEGATIVE RODS  Final   Report Status PENDING  Incomplete  Blood Culture (routine x 2)     Status: None (Preliminary result)   Collection Time: 05/07/16  9:12 PM  Result Value Ref Range Status   Specimen Description BLOOD LEFT ANTECUBITAL  Final   Special Requests BOTTLES DRAWN AEROBIC AND ANAEROBIC BCHV  Final   Culture  Setup Time   Final    GRAM NEGATIVE RODS IN BOTH AEROBIC AND ANAEROBIC BOTTLES CRITICAL RESULT CALLED TO, READ BACK BY AND VERIFIED WITH: SHEEMA HALLAJI AT 1833 ON 05/08/2016 JLJ    Culture   Final    GRAM NEGATIVE RODS CULTURE REINCUBATED FOR BETTER GROWTH Performed at [facility], [address] [place], [place] [address]    Report Status PENDING  Incomplete  Blood Culture ID Panel (Reflexed)     Status: Abnormal   Collection Time: 05/07/16  9:12 PM  Result Value Ref Range Status   Enterococcus species NOT DETECTED NOT DETECTED Final   Listeria monocytogenes NOT DETECTED NOT DETECTED Final   Staphylococcus species NOT DETECTED NOT DETECTED Final   Staphylococcus aureus NOT DETECTED NOT DETECTED Final   Streptococcus species NOT DETECTED NOT DETECTED Final   Streptococcus agalactiae NOT DETECTED NOT DETECTED Final   Streptococcus pneumoniae NOT DETECTED NOT DETECTED Final   Streptococcus pyogenes NOT DETECTED NOT DETECTED Final   Acinetobacter baumannii NOT DETECTED NOT DETECTED Final   Enterobacteriaceae species DETECTED (A) NOT DETECTED Final    Comment: Enterobacteriaceae represent a large family of gram-negative bacteria, not a  single organism. CRITICAL RESULT CALLED TO, READ BACK BY AND VERIFIED WITH: SHEEMA HALLAJI AT 1833 ON 05/08/2016 JLJ    Enterobacter cloacae complex NOT DETECTED NOT DETECTED Final   Escherichia coli NOT DETECTED NOT DETECTED Final   Klebsiella oxytoca NOT DETECTED NOT DETECTED Final   Klebsiella pneumoniae NOT DETECTED NOT DETECTED Final   Proteus species DETECTED (A) NOT DETECTED Final    Comment: CRITICAL RESULT CALLED TO, READ BACK BY AND VERIFIED WITH: SHEEMA HALLAJI AT 1833 ON 05/08/2016 JLJ    Serratia marcescens NOT DETECTED  NOT DETECTED Final   Carbapenem resistance NOT DETECTED NOT DETECTED Final   Haemophilus influenzae NOT DETECTED NOT DETECTED Final   Neisseria meningitidis NOT DETECTED NOT DETECTED Final   Pseudomonas aeruginosa NOT DETECTED NOT DETECTED Final   Candida albicans NOT DETECTED NOT DETECTED Final   Candida glabrata NOT DETECTED NOT DETECTED Final   Candida krusei NOT DETECTED NOT DETECTED Final   Candida parapsilosis NOT DETECTED NOT DETECTED Final   Candida tropicalis NOT DETECTED NOT DETECTED Final  Respiratory Panel by PCR     Status: None   Collection Time: 05/08/16  5:25 PM  Result Value Ref Range Status   Adenovirus NOT DETECTED NOT DETECTED Final   Coronavirus 229E NOT DETECTED NOT DETECTED Final   Coronavirus HKU1 NOT DETECTED NOT DETECTED Final   Coronavirus NL63 NOT DETECTED NOT DETECTED Final   Coronavirus OC43 NOT DETECTED NOT DETECTED Final   Metapneumovirus NOT DETECTED NOT DETECTED Final   Rhinovirus / Enterovirus NOT DETECTED NOT DETECTED Final   Influenza A NOT DETECTED NOT DETECTED Final   Influenza B NOT DETECTED NOT DETECTED Final   Parainfluenza Virus 1 NOT DETECTED NOT DETECTED Final   Parainfluenza Virus 2 NOT DETECTED NOT DETECTED Final   Parainfluenza Virus 3 NOT DETECTED NOT DETECTED Final   Parainfluenza Virus 4 NOT DETECTED NOT DETECTED Final   Respiratory Syncytial Virus NOT DETECTED NOT DETECTED Final   Bordetella  pertussis NOT DETECTED NOT DETECTED Final   Chlamydophila pneumoniae NOT DETECTED NOT DETECTED Final   Mycoplasma pneumoniae NOT DETECTED NOT DETECTED Final    Comment: Performed at [facility], [address] [place], [place] [address]  MRSA PCR Screening     Status: None   Collection Time: 05/09/16  7:45 AM  Result Value Ref Range Status   MRSA by PCR NEGATIVE NEGATIVE Final    Comment:        The GeneXpert MRSA Assay (FDA approved for NASAL specimens only), is one component of a comprehensive MRSA colonization surveillance program. It is not intended to diagnose MRSA infection nor to guide or monitor treatment for MRSA infections.      Studies: Ct Abdomen Pelvis Wo Contrast  Result Date: 05/09/2016 CLINICAL DATA:  41 year old male with dyspnea, nausea, vomiting and diarrhea for the past several days EXAM: CT CHEST, ABDOMEN AND PELVIS WITHOUT CONTRAST TECHNIQUE: Multidetector CT imaging of the chest, abdomen and pelvis was performed following the standard protocol without IV contrast. COMPARISON:  None. FINDINGS: CT CHEST FINDINGS Cardiovascular: Limited evaluation in the absence of intravenous contrast. The heart is normal in size. No aortic aneurysm. Small volume pericardial fluid. Mediastinum/Nodes: Unremarkable CT appearance of the thyroid gland. No suspicious mediastinal or hilar adenopathy. No soft tissue mediastinal mass. The thoracic esophagus is unremarkable. Lungs/Pleura: Bilateral ground-glass attenuation airspace opacity and consolidation throughout the lungs bilaterally in a peribronchovascular distribution. Findings are most consistent with a multifocal infectious/ inflammatory process. No interlobular septal thickening, pleural effusion, pneumothorax or nodularity. Musculoskeletal: No acute fracture or aggressive appearing lytic or blastic osseous lesion. CT ABDOMEN PELVIS FINDINGS Hepatobiliary: No focal liver abnormality is seen. No gallstones, gallbladder wall  thickening, or biliary dilatation. Pancreas: Unremarkable. No pancreatic ductal dilatation or surrounding inflammatory changes. Spleen: Normal in size without focal abnormality. 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 There is moderate hydronephrosis in the upper and interpolar regions. At least partially obstructing 7 x 12 mm calculus in the right mid to upper ureter. There is associated  is right renal edema and mild perinephric stranding. No stones present in the left kidney. The adrenal glands are normal. Unremarkable bladder. Stomach/Bowel: No evidence of obstruction or focal bowel wall thickening. Normal appendix in the right lower quadrant. The terminal ileum is unremarkable. Vascular/Lymphatic: No significant vascular findings are present. No enlarged abdominal or pelvic lymph nodes. Reproductive: Prostate is unremarkable. Other: Small fat containing umbilical hernia. Musculoskeletal: No acute fracture or aggressive appearing lytic or blastic osseous lesion. IMPRESSION: CT CHEST 1. Consolidative changes and ground-glass attenuation airspace opacities in a peribronchovascular distribution throughout all lobes of both lungs. The imaging appearance is most consistent with a multi lobar infectious/inflammatory process such as multi lobar pneumonia, an atypical or viral respiratory illness, or acute pneumonitis. 2. Small pericardial effusion. CT ABD/PELVIS 1. Obstructing 7 x 12 mm stone in the right proximal to mid ureter with resultant moderate right-sided hydronephrosis, renal edema and perinephric stranding. 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 2. Small fat containing umbilical hernia. Electronically Signed   By: Malachy Moan M.D.   On: 05/09/2016 13:42   Dg Chest 2 View  Result Date: 05/08/2016 CLINICAL DATA:  Shortness of breath and weakness EXAM: CHEST  2 VIEW  COMPARISON:  None. FINDINGS: Cardiac shadow is within normal limits. The lungs are well aerated bilaterally. No focal infiltrate or sizable effusion is seen. Degenerative changes of thoracic spine are noted. IMPRESSION: No active cardiopulmonary disease. Electronically Signed   By: Alcide Clever M.D.   On: 05/08/2016 13:48   Ct Chest Wo Contrast  Result Date: 05/09/2016 CLINICAL DATA:  41 year old male with dyspnea, nausea, vomiting and diarrhea for the past several days EXAM: CT CHEST, ABDOMEN AND PELVIS WITHOUT CONTRAST TECHNIQUE: Multidetector CT imaging of the chest, abdomen and pelvis was performed following the standard protocol without IV contrast. COMPARISON:  None. FINDINGS: CT CHEST FINDINGS Cardiovascular: Limited evaluation in the absence of intravenous contrast. The heart is normal in size. No aortic aneurysm. Small volume pericardial fluid. Mediastinum/Nodes: Unremarkable CT appearance of the thyroid gland. No suspicious mediastinal or hilar adenopathy. No soft tissue mediastinal mass. The thoracic esophagus is unremarkable. Lungs/Pleura: Bilateral ground-glass attenuation airspace opacity and consolidation throughout the lungs bilaterally in a peribronchovascular distribution. Findings are most consistent with a multifocal infectious/ inflammatory process. No interlobular septal thickening, pleural effusion, pneumothorax or nodularity. Musculoskeletal: No acute fracture or aggressive appearing lytic or blastic osseous lesion. CT ABDOMEN PELVIS FINDINGS Hepatobiliary: No focal liver abnormality is seen. No gallstones, gallbladder wall thickening, or biliary dilatation. Pancreas: Unremarkable. No pancreatic ductal dilatation or surrounding inflammatory changes. Spleen: Normal in size without focal abnormality. 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 There is moderate hydronephrosis in the upper and interpolar regions. At least partially  obstructing 7 x 12 mm calculus in the right mid to upper ureter. There is associated is right renal edema and mild perinephric stranding. No stones present in the left kidney. The adrenal glands are normal. Unremarkable bladder. Stomach/Bowel: No evidence of obstruction or focal bowel wall thickening. Normal appendix in the right lower quadrant. The terminal ileum is unremarkable. Vascular/Lymphatic: No significant vascular findings are present. No enlarged abdominal or pelvic lymph nodes. Reproductive: Prostate is unremarkable. Other: Small fat containing umbilical hernia. Musculoskeletal: No acute fracture or aggressive appearing lytic or blastic osseous lesion. IMPRESSION: CT CHEST 1. Consolidative changes and ground-glass attenuation airspace opacities in a peribronchovascular distribution throughout all lobes of both  lungs. The imaging appearance is most consistent with a multi lobar infectious/inflammatory process such as multi lobar pneumonia, an atypical or viral respiratory illness, or acute pneumonitis. 2. Small pericardial effusion. CT ABD/PELVIS 1. Obstructing 7 x 12 mm stone in the right proximal to mid ureter with resultant moderate right-sided hydronephrosis, renal edema and perinephric stranding. 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 2. Small fat containing umbilical hernia. Electronically Signed   By: Malachy MoanHeath  McCullough M.D.   On: 05/09/2016 13:42   Dg Chest Portable 1 View  Result Date: 05/07/2016 CLINICAL DATA:  41 year old male with shortness of breath and fever. EXAM: PORTABLE CHEST 1 VIEW COMPARISON:  None. FINDINGS: The heart size and mediastinal contours are within normal limits. Both lungs are clear. The visualized skeletal structures are unremarkable. IMPRESSION: No active disease. Electronically Signed   By: Elgie CollardArash  Radparvar M.D.   On: 05/07/2016 20:16   [place] Abdomen Limited Ruq  Result Date: 05/09/2016 CLINICAL  DATA:  Right upper quadrant pain for the past 3 days. EXAM: US ABDOMEN LIMITED - RIGHT UPPER QUADRANT COMPARISON:  Chest radiograph - 05/08/2016 FINDINGS: Gallbladder: Sonographically normal. No gallbladder wall thickening or pericholecystic fluid. No echogenic gallstones or biliary sludge. Negative sonographic Murphy's sign. Common bile duct: Diameter: Normal in size measuring 3 mm in diameter Liver: Normal appearance of the liver. No discrete hepatic lesions. No intrahepatic biliary duct dilatation. No ascites. Incidentally noted trace right-sided pleural effusion (image 33) as well as potential mild dilatation involving a pole calyx (image 34). IMPRESSION: 1. Incidentally noted trace right-sided pleural effusion as well as potential mild dilatation involving an incidentally imaged right upper pole renal calyx. Clinical correlation is advised. Further evaluation could be performed with noncontrast abdominal CT as clinically indicated. 2. Otherwise, no explanation for patient's right upper quadrant abdominal pain. Specifically, no evidence of cholelithiasis/cholecystitis. Electronically Signed   By: Simonne ComeJohn  Watts M.D.   On: 05/09/2016 10:02    Scheduled Meds: . aspirin EC  81 mg Oral Daily  . azithromycin  500 mg Oral Daily   Followed by  . [START ON 05/10/2016] azithromycin  250 mg Oral Daily  . diltiazem  180 mg Oral Daily  . efavirenz-emtricitabine-tenofovir  1 tablet Oral QHS  . insulin aspart  0-20 Units Subcutaneous TID WC  . insulin aspart  0-5 Units Subcutaneous QHS  . insulin glargine  10 Units Subcutaneous QHS  . meropenem  1 g Intravenous Q8H  . pravastatin  20 mg Oral Daily  . sodium chloride flush  3 mL Intravenous Q12H   Continuous Infusions: . heparin 2,000 Units/hr (05/09/16 1043)    Assessment/Plan:  1. Clinical sepsis, Pneumonia seen on CT scan and not on 2 x-rays. Pro-calcitonin very high. Blood cultures positive for Proteus. Discontinue vancomycin. Continue meropenem. In the  Zithromax for atypical coverage for pneumonia also. 2. Acute respiratory failure with hypoxia. Patient on 100% nonrebreather. Watch respiratory status closely in the ICU. Critical care specialist off IV fluids and gave 1 dose of Lasix but this is likely pneumonia causing the patient's respiratory status. 3. HIV disease. Continue his usual medication 4. Acute kidney injury. Continue IV fluid hydration and hold lisinopril for now. 5. Hyperlipidemia unspecified on pravastatin 6. Essential hypertension on diltiazem  7. Type 2 diabetes mellitus with hyperglycemia. Hold oral medications and started low-dose Lantus and sliding scale. 8. Elevated bilirubin. Right upper quadrant sonogram negative 9. Elevated troponin demand ischemia from sepsis  Code  Status:     Code Status Orders        Start     Ordered   05/08/16 0043  Full code  Continuous     05/08/16 0043    Code Status History    Date Active Date Inactive Code Status Order ID Comments User Context   This patient has a current code status but no historical code status.     Disposition Plan: To be determined  Consultants:  Infectious disease  Antibiotics:  Meropenem  Zithromax  Time spent: 26 minutes  Alford Highland  [employer]

## 2016-05-09 NOTE — Anesthesia Preprocedure Evaluation (Signed)
Anesthesia Evaluation  Patient identified by MRN, date of birth, ID band Patient awake    Reviewed: Allergy & Precautions, H&P , NPO status , Patient's Chart, lab work & pertinent test results, reviewed documented beta blocker date and time   History of Anesthesia Complications Negative for: history of anesthetic complications  Airway Mallampati: III  TM Distance: >3 FB Neck ROM: full    Dental  (+) Caps, Teeth Intact Permanent bridge x 2:   Pulmonary neg shortness of breath, neg COPD, neg recent URI, Current Smoker,  Currently on non-rebreather, sating 97%          Cardiovascular Exercise Tolerance: Good hypertension, (-) angina(-) CAD, (-) Past MI, (-) Cardiac Stents and (-) CABG (-) dysrhythmias (-) Valvular Problems/Murmurs     Neuro/Psych negative neurological ROS  negative psych ROS   GI/Hepatic negative GI ROS, Neg liver ROS,   Endo/Other  diabetesMorbid obesity  Renal/GU negative Renal ROS  negative genitourinary   Musculoskeletal   Abdominal   Peds  Hematology  (+) HIV,   Anesthesia Other Findings Past Medical History: 05/08/2016: Asplenia No date: Diabetes mellitus without complication (HCC) No date: HIV (human immunodeficiency virus infection) (* No date: Hypertension   Reproductive/Obstetrics negative OB ROS                             Anesthesia Physical Anesthesia Plan  ASA: III and emergent  Anesthesia Plan: General   Post-op Pain Management:    Induction:   Airway Management Planned:   Additional Equipment:   Intra-op Plan:   Post-operative Plan:   Informed Consent: I have reviewed the patients History and Physical, chart, labs and discussed the procedure including the risks, benefits and alternatives for the proposed anesthesia with the patient or authorized representative who has indicated his/her understanding and acceptance.   Dental Advisory  Given  Plan Discussed with: Anesthesiologist, CRNA and Surgeon  Anesthesia Plan Comments:         Anesthesia Quick Evaluation

## 2016-05-09 NOTE — Progress Notes (Signed)
ANTICOAGULATION CONSULT NOTE - Initial Consult  Pharmacy Consult for Heparin Drip  Indication: pulmonary embolus       Allergies  Allergen Reactions  . Penicillins Nausea And Vomiting    Has patient had a PCN reaction causing immediate rash, facial/tongue/throat swelling, SOB or lightheadedness with hypotension: yes rash Has patient had a PCN reaction causing severe rash involving mucus membranes or skin necrosis:no Has patient had a PCN reaction that required hospitalization no Has patient had a PCN reaction occurring within the last 10 years: yes If all of the above answers are "NO", then may proceed with Cephalosporin use.     Patient Measurements: Height: 5\' 6"  (167.6 cm) Weight: 267 lb 9.6 oz (121.4 kg) IBW/kg (Calculated) : 63.8 Heparin Dosing Weight: 92 kg  Vital Signs: Temp: 98.6 F (37 C) (03/16 1634) Temp Source: Oral (03/16 1634) BP: 131/73 (03/16 1634) Pulse Rate: 77 (03/16 1634)  Labs:  Recent Labs (last 2 labs)    Recent Labs  05/07/16 1259 05/07/16 1507  05/08/16 0123 05/08/16 0823 05/08/16 1348  HGB  --  13.8  --  13.0  --   --   HCT  --  40.6  --  39.2*  --   --   PLT  --  162  --  171  --   --   APTT  --   --   --  29  --   --   LABPROT  --   --   --  15.7*  --   --   INR  --   --   --  1.24  --   --   CREATININE 1.12  --   --  1.48*  --   --   CKTOTAL  --  252  --   --   --   --   TROPONINI  --   --   < > 0.08* 0.14* 0.07*  < > = values in this interval not displayed.    Estimated Creatinine Clearance: 82.3 mL/min (A) (by C-G formula based on SCr of 1.48 mg/dL (H)).   Medical History:     Past Medical History:  Diagnosis Date  . Asplenia 05/08/2016  . Diabetes mellitus without complication (HCC)   . HIV (human immunodeficiency virus infection) (HCC)   . Hypertension      Assessment:  Goal of Therapy:  Heparin level 0.3-0.7 units/ml Monitor platelets by anticoagulation protocol: Yes   Plan:  DW: 92 kg   Baseline labs ordered.  Bolus 4000 units  Start heparin infusion at 1500 units/hr Check anti-Xa level in 6 hours and daily while on heparin Continue to monitor H&H and platelets  3/17 HL @ 0110: 0.11 subtherapeutic. Will rebolus w/ 3500 units (30 units/kg TBW) and increase rate to 1700 units/hour (14 units/kg/hr TBW; 18 units/hr HDW). Will recheck HL 3/17 @ 1000. Will continue to monitor CBC.  Thank you for this consult.  Thomasene Rippleavid Kayvan Hoefling, PharmD, BCPS Clinical Pharmacist 05/09/2016

## 2016-05-09 NOTE — Op Note (Signed)
Preoperative diagnosis:  1. Infected right mid ureteral stone 2. Urosepsis   Postoperative diagnosis:  1. Same 2. Urethral stricture   Procedure:  1. Cystoscopy 2. Urethral stricture dilation 3. right ureteral stent placement 4. right retrograde pyelography with interpretation   Surgeon: Crist FatBenjamin W. Zalaya Astarita, MD  Anesthesia: General  Complications: None  Intraoperative findings:  #1: The patient had a narrowed urethral meatus which I dilated with Sissy [place] sounds up to 30 [place]. He also had a bulbar urethral stricture which was seemingly dense but I was able to dilate with the rigid cystoscope after passing a wire across the stricture. #2: The patient's retrograde pyelogram was performed with minimal Omnipaque contrast to avoid inoculating the kidney and thus additional bacteria into the blood stream. This demonstrated a large filling defect in the lower pole right kidney. #3: The stone was severely impacted in the mid ureter I difficulty bypassing it with a wire, ultimately was able to get the wire across and then the open-ended ureteral catheter. There was a strong nephrotic drip with severe purulence. #4: Fluoroscopy was very limited given the patient's size, the images obtained were very poor. This made the procedure exceedingly difficult given the obstructing stone which was difficult to pass with a wire initially. #5:26 cm times 6 French double-J ureteral stent was ultimately placed and noted to be in good position with purulent Deflux.  EBL: Minimal  Specimens: Urine culture was sent from the right renal pelvis  Indication: Dave Conrad is a 41 y.o. patient with an obstructing right ureteral stone with bacteremia and septoid physiology. After reviewing the management options for treatment, he elected to proceed with the above surgical procedure(s). We have discussed the potential benefits and risks of the procedure, side effects of the proposed treatment, the likelihood of the  patient achieving the goals of the procedure, and any potential problems that might occur during the procedure or recuperation. Informed consent has been obtained.  Description of procedure:  The patient was taken to the operating room and general anesthesia was induced.  The patient was placed in the dorsal lithotomy position, prepped and draped in the usual sterile fashion, and preoperative antibiotics were administered. A preoperative time-out was performed.   Cystourethroscopy was performed.  The patient had narrow urethral meatus which was dilated with Sissy [place] sounds to 30 [place]. He also had a bulbar stricture which was managed by gentle pressure with the cystoscope to 21 [place]. See the above findings for additional details.. The bladder was then systematically examined in its entirety. There was no evidence for any bladder tumors, stones, or other mucosal pathology.    Attention then turned to the rightureteral orifice and a 0.38 sensor guidewire was then advanced up the right ureter into the renal pelvis under fluoroscopic guidance. I then advanced the open-ended ureteral catheter over the wire but was unable to get beyond the stone initially. With some gentle pressure was able to get the wire across the stone and then the open-ended catheter up into the hydronephrotic region. I then removed wire and was able to obtain a renal pelvic culture. I then instilled 5 mL of contrast performed retrograde pyelogram with the above findings. I then replaced the wire and removed the open-ended catheter. I then advanced the 26 cm times 6 French double-J ureteral stent over the wire and advanced it into the right renal pelvis packing of the wire slowly. Once the stent had been advanced all the way to the bladder neck to wire was  then removed entirely. Purulent Deflux was noted to stent. This point I replaced the wire through the scope and removed the scope leaving the wire in the bladder. I then advanced a 16  French urethral catheter through the urethra and the bladder successfully. The wire was then removed as it was not needed.   The bladder was then emptied and the procedure ended.  The patient was kept intubated and returned to the ICU in critical condition.  Crist Fat, M.D.

## 2016-05-09 NOTE — Progress Notes (Signed)
Called by RN taking care of patient that oxygen saturation is 86 % on 10 liter via non rebreather mask. Check ABG. Transfer patient to step down unit for close monitoring.Will put patient on bipap.

## 2016-05-09 NOTE — Progress Notes (Signed)
Pt has remained alert and oriented with no c/o pain. NSR/ST in low 100's on cardiac monitor. ST most consistent with increasing fever. TMAX 102.1 this shift. Fever has been monitored closely-APAP has been given x1. Pt has remained on NRB. Pt transitioned to 8LNC on NRB,[facility] but desated to 84% -> 15L resumed on NRB. SpO2 has remained >90%. No distress noted.  Pt has has very good urine output this shift d/t 40mg  IV lasix this am. Pt currently in transit to OR for R cystoscopy-large kidney stone found on ABD CT this afternoon.

## 2016-05-09 NOTE — Progress Notes (Signed)
Patient ID: Dave Conrad, male   DOB: 1975/03/21, 41 y.o.   MRN: [id number]  Ultrasound of lower extremities negative. Discontinue heparin drip  Trying to page urology for obstructing stone and right sided hydronephrosis.  Also has staghorn calculi.  Dr Renae GlossWieting

## 2016-05-09 NOTE — Progress Notes (Signed)
eLink Physician-Brief Progress Note Patient Name: Dave Conrad DOB: [birth year] MRN: [id number]   Date of Service  05/09/2016  HPI/Events of Note  Agitation - Currently on a Fentanyl IV infusion at 400 mcg/hour and Versed IV PRN. Propofol IV infusion appropriately D/Ced d/t Triglyceride level = 505.  eICU Interventions  Will order: 1. Precedex IV infusion. Titrate to RASS 0 to -1.        [name],[name] 05/09/2016, 9:48 PM

## 2016-05-09 NOTE — Progress Notes (Signed)
Pharmacy Antibiotic Note  Dave Conrad is a 41 y.o. male with a h/o HIV admitted on 05/07/2016 with sepsisPharmacy has been consulted for vancomycin and meropenem dosing. Aztreonam and Levaquin initially ordered due to PCN allergy but reaction is N/V and itching with no rash.   Plan: Day 2- Meropenem 1 g iv q 8 hours.   Day 3- Vancomycin 2000 mg iv administered. Will order vancomycin 1000 mg iv q 8 hours and check a level with the 4th dose for a goal trough of 15-20 mcg/ml.   3/17 Vancomycin trough = 14 mcg/ml. Patient weight= 121.4 kg. Anticipate patient will begin to accumulate due to obesity. Will continue current regimen of Vancomycin 1 gram IV q8h for now and recheck tomorrow 3/18.   Height: 5\' 6"  (167.6 cm) Weight: 267 lb 9.6 oz (121.4 kg) IBW/kg (Calculated) : 63.8  Temp (24hrs), Avg:100.3 F (37.9 C), Min:98.6 F (37 C), Max:102.3 F (39.1 C)   Recent Labs Lab 05/07/16 1259 05/07/16 1507 05/07/16 2111 05/07/16 2334 05/08/16 0123 05/08/16 1640 05/09/16 0512 05/09/16 0938  WBC  --  16.4*  --   --  12.5*  --   --   --   CREATININE 1.12  --   --   --  1.48* 1.36* 1.43*  --   LATICACIDVEN  --   --  2.2* 1.5  --   --   --   --   VANCOTROUGH  --   --   --   --   --   --   --  14*    Estimated Creatinine Clearance: 85.1 mL/min (A) (by C-G formula based on SCr of 1.43 mg/dL (H)).    Allergies  Allergen Reactions  . Penicillins Nausea And Vomiting    Has patient had a PCN reaction causing immediate rash, facial/tongue/throat swelling, SOB or lightheadedness with hypotension: yes rash Has patient had a PCN reaction causing severe rash involving mucus membranes or skin necrosis:no Has patient had a PCN reaction that required hospitalization no Has patient had a PCN reaction occurring within the last 10 years: yes If all of the above answers are "NO", then may proceed with Cephalosporin use.     Antimicrobials this admission: 0315 aztreonam and Levaquin x 1 Vancomycin  3/15 >> Meropenem 3/16 >>  Dose adjustments this admission:  Microbiology results: 3/15 BCx: GNR- BCID: Proteus 4 of 4 bottles 3/15 UCx: NG Sputum: pending   Thank you for allowing pharmacy to be a part of this patient's care.  Misha Antonini A 05/09/2016 10:39 AM

## 2016-05-09 NOTE — Consult Note (Signed)
I have been asked to see the patient by Dr. Alford Highland, for evaluation and management of infected right ureteral stone.  History of present illness: 41 year old male with a history of HIV presented to the ER 4 days of vomiting. It some mild upper abdominal cramping. He denies any fevers or chills initially. He was admitted for closer evaluation and developed fevers and a increased oxygen requirement.  He became tachycardic and a leukocytosis.  Further workup revealed Klebsiella sepsis. CT scan was obtained and demonstrated a large right mid ureteral stone with associated proximal hydronephrosis. The patient has a partial staghorn in the right lower pole.  Once the CT scan was obtained, urology was called to soon thereafter for further management of his obstructing stone. The time of our encounter, the patient was having problems with his oxygenation, and good not really talk. It says without dropping his O2. He is complaining of mild right-sided flank and abdominal pain.  Review of systems: A 12 point comprehensive review of systems was obtained and is negative unless otherwise stated in the history of present illness.  Patient Active Problem List   Diagnosis Date Noted  . Malnutrition of moderate degree 05/09/2016  . Asplenia 05/08/2016  . Sepsis (HCC) 05/07/2016    No current facility-administered medications on file prior to encounter.    No current outpatient prescriptions on file prior to encounter.    Past Medical History:  Diagnosis Date  . Asplenia 05/08/2016  . Diabetes mellitus without complication (HCC)   . HIV (human immunodeficiency virus infection) (HCC)   . Hypertension     History reviewed. No pertinent surgical history.  Social History  Substance Use Topics  . Smoking status: Current Some Day Smoker    Types: Cigarettes  . Smokeless tobacco: Never Used  . Alcohol use No    History reviewed. No pertinent family history.  PE: Vitals:   05/09/16 1400  05/09/16 1456 05/09/16 1500 05/09/16 1600  BP: (!) 153/92  (!) 141/90 (!) 136/94  Pulse: (!) 106  (!) 116 100  Resp: 16  (!) 36 (!) 25  Temp: (!) 101.8 F (38.8 C)     TempSrc: Axillary     SpO2: 96% 98% 92% 100%  Weight:      Height:       Patient Is diaphoretic and in mild distress with an nonrebreather patient is alert and oriented x3 Atraumatic normocephalic head No cervical or supraclavicular lymphadenopathy appreciated No increased work of breathing, no audible wheezes/rhonchi Tachycardia, regular rhythm Abdomen is soft, tender with right CVA tenderness Lower extremities are symmetric without appreciable edema Grossly neurologically intact No identifiable skin lesions   Recent Labs  05/07/16 1507 05/08/16 0123  WBC 16.4* 12.5*  HGB 13.8 13.0  HCT 40.6 39.2*    Recent Labs  05/07/16 1259 05/08/16 0123 05/08/16 1640 05/09/16 0512  NA 129* 130* 130*  --   K 4.0 4.0 3.8  --   CL 95* 93* 97*  --   CO2 24 24 23   --   GLUCOSE 301* 305* 266*  --   BUN 14 18 17   --   CREATININE 1.12 1.48* 1.36* 1.43*  CALCIUM 8.9 8.5* 8.5*  --     Recent Labs  05/08/16 0123  INR 1.24   No results for input(s): LABURIN in the last 72 hours. Results for orders placed or performed during the hospital encounter of 05/07/16  Urine culture     Status: None   Collection Time: 05/07/16 12:59  PM  Result Value Ref Range Status   Specimen Description URINE, RANDOM  Final   Special Requests NONE  Final   Culture   Final    NO GROWTH Performed at [facility], [address] [place], [place] [address]    Report Status 05/09/2016 FINAL  Final  Blood Culture (routine x 2)     Status: None (Preliminary result)   Collection Time: 05/07/16  9:12 PM  Result Value Ref Range Status   Specimen Description BLOOD RIGHT HAND  Final   Special Requests BOTTLES DRAWN AEROBIC AND ANAEROBIC  BCAV  Final   Culture  Setup Time   Final    GRAM NEGATIVE RODS IN BOTH AEROBIC AND ANAEROBIC  BOTTLES CRITICAL VALUE NOTED.  VALUE IS CONSISTENT WITH PREVIOUSLY REPORTED AND CALLED VALUE.    Culture GRAM NEGATIVE RODS  Final   Report Status PENDING  Incomplete  Blood Culture (routine x 2)     Status: None (Preliminary result)   Collection Time: 05/07/16  9:12 PM  Result Value Ref Range Status   Specimen Description BLOOD LEFT ANTECUBITAL  Final   Special Requests BOTTLES DRAWN AEROBIC AND ANAEROBIC BCHV  Final   Culture  Setup Time   Final    GRAM NEGATIVE RODS IN BOTH AEROBIC AND ANAEROBIC BOTTLES CRITICAL RESULT CALLED TO, READ BACK BY AND VERIFIED WITH: SHEEMA HALLAJI AT 1833 ON 05/08/2016 JLJ    Culture   Final    GRAM NEGATIVE RODS CULTURE REINCUBATED FOR BETTER GROWTH Performed at [facility], [address] [place], [place] [address]    Report Status PENDING  Incomplete  Blood Culture ID Panel (Reflexed)     Status: Abnormal   Collection Time: 05/07/16  9:12 PM  Result Value Ref Range Status   Enterococcus species NOT DETECTED NOT DETECTED Final   Listeria monocytogenes NOT DETECTED NOT DETECTED Final   Staphylococcus species NOT DETECTED NOT DETECTED Final   Staphylococcus aureus NOT DETECTED NOT DETECTED Final   Streptococcus species NOT DETECTED NOT DETECTED Final   Streptococcus agalactiae NOT DETECTED NOT DETECTED Final   Streptococcus pneumoniae NOT DETECTED NOT DETECTED Final   Streptococcus pyogenes NOT DETECTED NOT DETECTED Final   Acinetobacter baumannii NOT DETECTED NOT DETECTED Final   Enterobacteriaceae species DETECTED (A) NOT DETECTED Final    Comment: Enterobacteriaceae represent a large family of gram-negative bacteria, not a single organism. CRITICAL RESULT CALLED TO, READ BACK BY AND VERIFIED WITH: SHEEMA HALLAJI AT 1833 ON 05/08/2016 JLJ    Enterobacter cloacae complex NOT DETECTED NOT DETECTED Final   Escherichia coli NOT DETECTED NOT DETECTED Final   Klebsiella oxytoca NOT DETECTED NOT DETECTED Final   Klebsiella pneumoniae NOT  DETECTED NOT DETECTED Final   Proteus species DETECTED (A) NOT DETECTED Final    Comment: CRITICAL RESULT CALLED TO, READ BACK BY AND VERIFIED WITH: SHEEMA HALLAJI AT 1833 ON 05/08/2016 JLJ    Serratia marcescens NOT DETECTED NOT DETECTED Final   Carbapenem resistance NOT DETECTED NOT DETECTED Final   Haemophilus influenzae NOT DETECTED NOT DETECTED Final   Neisseria meningitidis NOT DETECTED NOT DETECTED Final   Pseudomonas aeruginosa NOT DETECTED NOT DETECTED Final   Candida albicans NOT DETECTED NOT DETECTED Final   Candida glabrata NOT DETECTED NOT DETECTED Final   Candida krusei NOT DETECTED NOT DETECTED Final   Candida parapsilosis NOT DETECTED NOT DETECTED Final   Candida tropicalis NOT DETECTED NOT DETECTED Final  Respiratory Panel by PCR  Status: None   Collection Time: 05/08/16  5:25 PM  Result Value Ref Range Status   Adenovirus NOT DETECTED NOT DETECTED Final   Coronavirus 229E NOT DETECTED NOT DETECTED Final   Coronavirus HKU1 NOT DETECTED NOT DETECTED Final   Coronavirus NL63 NOT DETECTED NOT DETECTED Final   Coronavirus OC43 NOT DETECTED NOT DETECTED Final   Metapneumovirus NOT DETECTED NOT DETECTED Final   Rhinovirus / Enterovirus NOT DETECTED NOT DETECTED Final   Influenza A NOT DETECTED NOT DETECTED Final   Influenza B NOT DETECTED NOT DETECTED Final   Parainfluenza Virus 1 NOT DETECTED NOT DETECTED Final   Parainfluenza Virus 2 NOT DETECTED NOT DETECTED Final   Parainfluenza Virus 3 NOT DETECTED NOT DETECTED Final   Parainfluenza Virus 4 NOT DETECTED NOT DETECTED Final   Respiratory Syncytial Virus NOT DETECTED NOT DETECTED Final   Bordetella pertussis NOT DETECTED NOT DETECTED Final   Chlamydophila pneumoniae NOT DETECTED NOT DETECTED Final   Mycoplasma pneumoniae NOT DETECTED NOT DETECTED Final    Comment: Performed at [facility], [address] [place], [place] [address]  MRSA PCR Screening     Status: None   Collection Time: 05/09/16  7:45 AM   Result Value Ref Range Status   MRSA by PCR NEGATIVE NEGATIVE Final    Comment:        The GeneXpert MRSA Assay (FDA approved for NASAL specimens only), is one component of a comprehensive MRSA colonization surveillance program. It is not intended to diagnose MRSA infection nor to guide or monitor treatment for MRSA infections.     Imaging: The patient has a stone protocol CT scan from today that demonstrates a large partial staghorn in the right lower pole as well as a large mid ureteral obstructing stone with associated proximal hydronephrosis.  Imp: The patient has an infected right mid ureteral stone with obstruction and associated decompensation and urosepsis.  Recommendations: I recommended that we proceed to the operating room urgently for right ureteral stent placement. This would allow [place] to get source control of his bacteremia. Ultimately, the patient will need a second procedure for his staghorn stone which point the ureteral stent can also be simultaneously treated. This will be once the patient recovers from his infection. I discussed today's procedure with him including the side effects of the stent. The patient fully understands and is willing to proceed to the operating room.   Berniece Salines W

## 2016-05-09 NOTE — Transfer of Care (Signed)
Immediate Anesthesia Transfer of Care Note  Patient: Dave Conrad  Procedure(s) Performed: Procedure(s): CYSTOSCOPY WITH STENT PLACEMENT and retrorade pyelogram (Right)  Patient Location: ICU  Anesthesia Type:General  Level of Consciousness: sedated  Airway & Oxygen Therapy: Patient Spontanous Breathing and Patient remains intubated per anesthesia plan  Post-op Assessment: Report given to RN and Post -op Vital signs reviewed and stable  Post vital signs: Reviewed and stable  Last Vitals:  Vitals:   05/09/16 1700 05/09/16 1915  BP:  (!) 126/92  Pulse:  (!) 111  Resp:  18  Temp: (!) 38.2 C 37.6 C    Last Pain:  Vitals:   05/09/16 1915  TempSrc: Temporal  PainSc:       Patients Stated Pain Goal: 3 (05/08/16 1853)  Complications: No apparent anesthesia complications

## 2016-05-09 NOTE — Progress Notes (Signed)
ANTICOAGULATION CONSULT NOTE -  Pharmacy Consult for Heparin Drip  Indication: pulmonary embolus       Allergies  Allergen Reactions  . Penicillins Nausea And Vomiting    Has patient had a PCN reaction causing immediate rash, facial/tongue/throat swelling, SOB or lightheadedness with hypotension: yes rash Has patient had a PCN reaction causing severe rash involving mucus membranes or skin necrosis:no Has patient had a PCN reaction that required hospitalization no Has patient had a PCN reaction occurring within the last 10 years: yes If all of the above answers are "NO", then may proceed with Cephalosporin use.     Patient Measurements: Height: 5\' 6"  (167.6 cm) Weight: 267 lb 9.6 oz (121.4 kg) IBW/kg (Calculated) : 63.8 Heparin Dosing Weight: 92 kg  Vital Signs: Temp: 98.6 F (37 C) (03/16 1634) Temp Source: Oral (03/16 1634) BP: 131/73 (03/16 1634) Pulse Rate: 77 (03/16 1634)  Labs:  Recent Labs (last 2 labs)    Recent Labs  05/07/16 1259 05/07/16 1507  05/08/16 0123 05/08/16 0823 05/08/16 1348  HGB  --  13.8  --  13.0  --   --   HCT  --  40.6  --  39.2*  --   --   PLT  --  162  --  171  --   --   APTT  --   --   --  29  --   --   LABPROT  --   --   --  15.7*  --   --   INR  --   --   --  1.24  --   --   CREATININE 1.12  --   --  1.48*  --   --   CKTOTAL  --  252  --   --   --   --   TROPONINI  --   --   < > 0.08* 0.14* 0.07*  < > = values in this interval not displayed.    Estimated Creatinine Clearance: 82.3 mL/min (A) (by C-G formula based on SCr of 1.48 mg/dL (H)).   Medical History:     Past Medical History:  Diagnosis Date  . Asplenia 05/08/2016  . Diabetes mellitus without complication (HCC)   . HIV (human immunodeficiency virus infection) (HCC)   . Hypertension      Assessment:  Goal of Therapy:  Heparin level 0.3-0.7 units/ml Monitor platelets by anticoagulation protocol: Yes   Plan:  DW: 92 kg  Baseline labs  ordered.  Bolus 4000 units  Start heparin infusion at 1500 units/hr Check anti-Xa level in 6 hours and daily while on heparin Continue to monitor H&H and platelets  3/17 HL @ 0110: 0.11 subtherapeutic. Will rebolus w/ 3500 units (30 units/kg TBW) and increase rate to 1700 units/hour (14 units/kg/hr TBW; 18 units/hr HDW). Will recheck HL 3/17 @ 1000. Will continue to monitor CBC.  3/17 HL@ 0938= 0.13.  Will give bolus of 2800 units and increase drip to 2000 units/hr. Will recheck HL at 1700.  Thank you for this consult.  Bari MantisKristin Martina Brodbeck PharmD Clinical Pharmacist 05/09/2016

## 2016-05-09 NOTE — Consult Note (Signed)
[facility]  Critical Care Medicine Consultation     ASSESSMENT/PLAN   Hypoxemia. Unclear etiology but patient requiring increasing FiO2 consistent with shunt fraction. Chest x-ray my opinion does show some vascular congestion and curly B-lines. Would hold on fluid resuscitation since blood pressure is 150. Would empirically try a dose of Lasix and obtain echocardiography both for LV function and pulmonary artery pressure measurement. With patient's last CD4 count being 688 this is not PCP, I don't see any lobar infiltrates noted on chest x-ray but would recommend obtaining a noncontrast CT scan of his chest secondary to increasing creatinine would avoid contrast if possible. Would also do abdominal and pelvis to see if there is an additional source of infection. Would obtain Dopplers of lower extremity and could consider ventilation/perfusion scan since he has been empirically anticoagulated if no clear etiology of hypoxemia.  INameShelly Shoultz MRN: [id number] DOB: 06-26-1975    ADMISSION DATE:  05/07/2016 CONSULTATION DATE:  05/09/2016  REFERRING MD :  Dr. Renae Gloss  CHIEF COMPLAINT:  Shortness of breath   HISTORY OF PRESENT ILLNESS:  Mr. Boettner is a 41 y.o. male with HIV, Last CD4 688 and VL <20  followed at [facility], presently on Atripla, DM, HTN admitted with complaints of vomiting, lethargy, fevers and now pprogressive shortness of breath. Patient states symptoms began 5 days ago with nasuea and vomiting and mild abd pain. He notes that he also began sleeping a lot and complaining of general lethargy. He thought it was food poisoning but persisted. He has not had any diarrhea.  He has not had cough but now has become hypoxic. He denies any recent travel or sick contacts. Of note patient is also asplenic.  PAST MEDICAL HISTORY :  Past Medical History:  Diagnosis Date  . Asplenia 05/08/2016  . Diabetes mellitus without complication (HCC)   . HIV (human immunodeficiency virus infection) (HCC)     . Hypertension    History reviewed. No pertinent surgical history. Prior to Admission medications   Medication Sig Start Date End Date Taking? Authorizing Provider  diltiazem (DILACOR XR) 180 MG 24 hr capsule Take 180 mg by mouth daily.   Yes Historical Provider, MD  efavirenz-emtricitabine-tenofovir (ATRIPLA) 600-200-300 MG tablet Take 1 tablet by mouth at bedtime.   Yes Historical Provider, MD  glipiZIDE (GLUCOTROL XL) 10 MG 24 hr tablet Take 5-10 mg by mouth 2 (two) times daily. Take 10 mg in the morning and 5 mg in the evening   Yes Historical Provider, MD  lisinopril (PRINIVIL,ZESTRIL) 40 MG tablet Take 40 mg by mouth daily.   Yes Historical Provider, MD  metFORMIN (GLUCOPHAGE) 500 MG tablet Take 1,000 mg by mouth 2 (two) times daily with a meal.   Yes Historical Provider, MD  pravastatin (PRAVACHOL) 20 MG tablet Take 20 mg by mouth daily.   Yes Historical Provider, MD  aspirin EC 81 MG tablet Take 81 mg by mouth daily.    Historical Provider, MD   Allergies  Allergen Reactions  . Penicillins Nausea And Vomiting    Has patient had a PCN reaction causing immediate rash, facial/tongue/throat swelling, SOB or lightheadedness with hypotension: yes rash Has patient had a PCN reaction causing severe rash involving mucus membranes or skin necrosis:no Has patient had a PCN reaction that required hospitalization no Has patient had a PCN reaction occurring within the last 10 years: yes If all of the above answers are "NO", then may proceed with Cephalosporin use.     FAMILY HISTORY:  History  reviewed. No pertinent family history. SOCIAL HISTORY:  reports that he has been smoking Cigarettes.  He has never used smokeless tobacco. He reports that he does not drink alcohol. His drug history is not on file.  REVIEW OF SYSTEMS:   Constitutional: Feels Fatigued having fever chills and sweats Cardiovascular: No chest pain.  Pulmonary: Complaining of dyspnea.  Please see history of present  illness GI: Patient with poor by mouth intake, nausea and vomiting, please see history of present illness The remainder of systems were reviewed and were found to be negative other than what is documented in the HPI.    VITAL SIGNS: Temp:  [98.6 F (37 C)-102.3 F (39.1 C)] 100.8 F (38.2 C) (03/17 0942) Pulse Rate:  [77-130] 113 (03/17 0800) Resp:  [18-38] 30 (03/17 0800) BP: (130-178)/(72-97) 145/89 (03/17 0800) SpO2:  [91 %-99 %] 95 % (03/17 0800) HEMODYNAMICS:   VENTILATOR SETTINGS:   INTAKE / OUTPUT:  Intake/Output Summary (Last 24 hours) at 05/09/16 1023 Last data filed at 05/09/16 0919  Gross per 24 hour  Intake          3509.19 ml  Output             2575 ml  Net           934.19 ml    Physical Examination:   VS: BP (!) 145/89 (BP Location: Right Arm)   Pulse (!) 113   Temp (!) 100.8 F (38.2 C) (Axillary)   Resp (!) 30   Ht 5\' 6"  (1.676 m)   Wt 121.4 kg (267 lb 9.6 oz)   SpO2 95%   BMI 43.19 kg/m   General Appearance: No distress  Neuro:without focal findings, mental status, speech normal,. HEENT: PERRLA, EOM intact, no ptosis, no other lesions noticed;  Pulmonary: Some crackles appreciated in both lung bases CardiovascularNormal S1,S2.  No m/r/g.    Abdomen: Benign, Soft, non-tender, No masses, hepatosplenomegaly, No lymphadenopathy Renal:  No costovertebral tenderness  GU:  Not performed at this time. Endoc: No evident thyromegaly, no signs of acromegaly. Skin:   warm, no rashes, no ecchymosis  Extremities: normal, no cyanosis, clubbing, no edema, warm with normal capillary refill.    LABS: Reviewed   LABORATORY PANEL:   CBC  Recent Labs Lab 05/08/16 0123  WBC 12.5*  HGB 13.0  HCT 39.2*  PLT 171    Chemistries   Recent Labs Lab 05/08/16 1640 05/09/16 0512  NA 130*  --   K 3.8  --   CL 97*  --   CO2 23  --   GLUCOSE 266*  --   BUN 17  --   CREATININE 1.36* 1.43*  CALCIUM 8.5*  --   AST 45*  --   ALT 32  --   ALKPHOS 111   --   BILITOT 3.2*  --      Recent Labs Lab 05/08/16 0711 05/08/16 0913 05/08/16 1157 05/08/16 1643 05/08/16 2135 05/09/16 0700  GLUCAP 264* 282* 326* 270* 196* 246*    Recent Labs Lab 05/09/16 0608  PHART 7.44  PCO2ART 30*  PO2ART 51*    Recent Labs Lab 05/07/16 1259 05/08/16 0123 05/08/16 1640  AST 21 46* 45*  ALT 22 30 32  ALKPHOS 81 145* 111  BILITOT 1.3* 2.4* 3.2*  ALBUMIN 3.9 3.6 3.3*    Cardiac Enzymes  Recent Labs Lab 05/08/16 1348  TROPONINI 0.07*    RADIOLOGY:  Dg Chest 2 View  Result Date: 05/08/2016 CLINICAL DATA:  Shortness of breath and weakness EXAM: CHEST  2 VIEW COMPARISON:  None. FINDINGS: Cardiac shadow is within normal limits. The lungs are well aerated bilaterally. No focal infiltrate or sizable effusion is seen. Degenerative changes of thoracic spine are noted. IMPRESSION: No active cardiopulmonary disease. Electronically Signed   By: Alcide CleverMark  Lukens M.D.   On: 05/08/2016 13:48   Dg Chest Portable 1 View  Result Date: 05/07/2016 CLINICAL DATA:  41 year old male with shortness of breath and fever. EXAM: PORTABLE CHEST 1 VIEW COMPARISON:  None. FINDINGS: The heart size and mediastinal contours are within normal limits. Both lungs are clear. The visualized skeletal structures are unremarkable. IMPRESSION: No active disease. Electronically Signed   By: Elgie CollardArash  Radparvar M.D.   On: 05/07/2016 20:16   [place] Abdomen Limited Ruq  Result Date: 05/09/2016 CLINICAL DATA:  Right upper quadrant pain for the past 3 days. EXAM: US ABDOMEN LIMITED - RIGHT UPPER QUADRANT COMPARISON:  Chest radiograph - 05/08/2016 FINDINGS: Gallbladder: Sonographically normal. No gallbladder wall thickening or pericholecystic fluid. No echogenic gallstones or biliary sludge. Negative sonographic Murphy's sign. Common bile duct: Diameter: Normal in size measuring 3 mm in diameter Liver: Normal appearance of the liver. No discrete hepatic lesions. No intrahepatic biliary duct  dilatation. No ascites. Incidentally noted trace right-sided pleural effusion (image 33) as well as potential mild dilatation involving a pole calyx (image 34). IMPRESSION: 1. Incidentally noted trace right-sided pleural effusion as well as potential mild dilatation involving an incidentally imaged right upper pole renal calyx. Clinical correlation is advised. Further evaluation could be performed with noncontrast abdominal CT as clinically indicated. 2. Otherwise, no explanation for patient's right upper quadrant abdominal pain. Specifically, no evidence of cholelithiasis/cholecystitis. Electronically Signed   By: Simonne ComeJohn  Watts M.D.   On: 05/09/2016 10:02   Tora KindredJohn Faiza Bansal, DO 05/09/2016, 10:23 AM

## 2016-05-09 NOTE — Progress Notes (Signed)
Patient was up to BR and has cold chills.  HR in 140's spo2 at 60% on 5LO2.  Placed on nonrebreather at 10L O2.  Spoke with Dr pyreddy and advised for abg and bipap with transfer to stepdown.  Patient is alert and shivering.  Warm blankets given. Spoke with house sup ann. Charge nurse aware

## 2016-05-10 LAB — GLUCOSE, CAPILLARY
GLUCOSE-CAPILLARY: 319 mg/dL — AB (ref 65–99)
GLUCOSE-CAPILLARY: 322 mg/dL — AB (ref 65–99)
Glucose-Capillary: 262 mg/dL — ABNORMAL HIGH (ref 65–99)
Glucose-Capillary: 281 mg/dL — ABNORMAL HIGH (ref 65–99)
Glucose-Capillary: 282 mg/dL — ABNORMAL HIGH (ref 65–99)
Glucose-Capillary: 297 mg/dL — ABNORMAL HIGH (ref 65–99)

## 2016-05-10 LAB — PROCALCITONIN: Procalcitonin: 112.07 ng/mL

## 2016-05-10 LAB — HELPER T-LYMPH-CD4 (ARMC ONLY)
% CD 4 POS. LYMPH.: 30.4 % — AB (ref 30.8–58.5)
Absolute CD 4 Helper: 122 /uL — ABNORMAL LOW (ref 359–1519)
BASOS ABS: 0 10*3/uL (ref 0.0–0.2)
Basos: 0 %
EOS (ABSOLUTE): 0 10*3/uL (ref 0.0–0.4)
Eos: 0 %
HEMOGLOBIN: 13.3 g/dL (ref 13.0–17.7)
Hematocrit: 39.1 % (ref 37.5–51.0)
Immature Grans (Abs): 0 10*3/uL (ref 0.0–0.1)
Immature Granulocytes: 0 %
LYMPHS ABS: 0.4 10*3/uL — AB (ref 0.7–3.1)
Lymphs: 3 %
MCH: 27.5 pg (ref 26.6–33.0)
MCHC: 34 g/dL (ref 31.5–35.7)
MCV: 81 fL (ref 79–97)
MONOS ABS: 0.2 10*3/uL (ref 0.1–0.9)
Monocytes: 2 %
NEUTROS PCT: 95 %
Neutrophils Absolute: 12 10*3/uL — ABNORMAL HIGH (ref 1.4–7.0)
PLATELETS: 161 10*3/uL (ref 150–379)
RBC: 4.83 x10E6/uL (ref 4.14–5.80)
RDW: 15.4 % (ref 12.3–15.4)
WBC: 12.7 10*3/uL — ABNORMAL HIGH (ref 3.4–10.8)

## 2016-05-10 LAB — BASIC METABOLIC PANEL
Anion gap: 9 (ref 5–15)
BUN: 27 mg/dL — AB (ref 6–20)
CHLORIDE: 98 mmol/L — AB (ref 101–111)
CO2: 25 mmol/L (ref 22–32)
CREATININE: 1.62 mg/dL — AB (ref 0.61–1.24)
Calcium: 8 mg/dL — ABNORMAL LOW (ref 8.9–10.3)
GFR calc Af Amer: >60 mL/min (ref >60)
GFR calc non Af Amer: 52 mL/min — ABNORMAL LOW (ref >60)
GLUCOSE: 325 mg/dL — AB (ref 65–99)
POTASSIUM: 4.2 mmol/L (ref 3.5–5.1)
SODIUM: 132 mmol/L — AB (ref 135–145)

## 2016-05-10 LAB — CBC
HCT: 34.2 % — ABNORMAL LOW (ref 40.0–52.0)
HEMOGLOBIN: 11.6 g/dL — AB (ref 13.0–18.0)
MCH: 27.2 pg (ref 26.0–34.0)
MCHC: 34 g/dL (ref 32.0–36.0)
MCV: 79.8 fL — AB (ref 80.0–100.0)
Platelets: 165 10*3/uL (ref 150–440)
RBC: 4.28 MIL/uL — AB (ref 4.40–5.90)
RDW: 15.7 % — ABNORMAL HIGH (ref 11.5–14.5)
WBC: 14.2 10*3/uL — ABNORMAL HIGH (ref 3.8–10.6)

## 2016-05-10 MED ORDER — PANTOPRAZOLE SODIUM 40 MG PO TBEC
40.0000 mg | DELAYED_RELEASE_TABLET | Freq: Every day | ORAL | Status: DC
  Administered 2016-05-10 – 2016-05-12 (×3): 40 mg via ORAL
  Filled 2016-05-10 (×3): qty 1

## 2016-05-10 MED ORDER — SODIUM CHLORIDE 0.9 % IV SOLN: INTRAVENOUS | Status: DC

## 2016-05-10 MED ORDER — INSULIN ASPART 100 UNIT/ML ~~LOC~~ SOLN
0.0000 [IU] | Freq: Every day | SUBCUTANEOUS | Status: DC
  Administered 2016-05-10: 3 [IU] via SUBCUTANEOUS
  Filled 2016-05-10: qty 3

## 2016-05-10 MED ORDER — INSULIN ASPART 100 UNIT/ML ~~LOC~~ SOLN
0.0000 [IU] | Freq: Three times a day (TID) | SUBCUTANEOUS | Status: DC
  Administered 2016-05-10: 7 [IU] via SUBCUTANEOUS
  Administered 2016-05-10: 5 [IU] via SUBCUTANEOUS
  Administered 2016-05-11: 2 [IU] via SUBCUTANEOUS
  Administered 2016-05-11: 3 [IU] via SUBCUTANEOUS
  Administered 2016-05-12: 2 [IU] via SUBCUTANEOUS
  Administered 2016-05-12: 3 [IU] via SUBCUTANEOUS
  Filled 2016-05-10: qty 7
  Filled 2016-05-10: qty 2
  Filled 2016-05-10: qty 5
  Filled 2016-05-10 (×2): qty 3
  Filled 2016-05-10: qty 2

## 2016-05-10 MED ORDER — GLIPIZIDE ER 10 MG PO TB24
10.0000 mg | ORAL_TABLET | Freq: Every day | ORAL | Status: DC
  Administered 2016-05-10 – 2016-05-12 (×3): 10 mg via ORAL
  Filled 2016-05-10 (×3): qty 1

## 2016-05-10 MED ORDER — INSULIN GLARGINE 100 UNIT/ML ~~LOC~~ SOLN
10.0000 [IU] | Freq: Every day | SUBCUTANEOUS | Status: DC
  Administered 2016-05-10: 10 [IU] via SUBCUTANEOUS
  Filled 2016-05-10 (×2): qty 0.1

## 2016-05-10 NOTE — Progress Notes (Addendum)
[facility] Critical Care Medicine Progess Note    ASSESSMENT/PLAN   Status post cystoscopy, stricture dilation and stent placement for renal calculi. Doing well.  Respiratory failure. Patient shunt fraction improved. He diuresed nearly 3 L yesterday and is down to FiO2 of 40% with +5 of PEEP. Will obtain a spontaneous awakening and breathing trial and assess for extubation  Renal failure. Bump in creatinine noted to 1.62, multifactorial to include sepsis, nephrolithiasis. Will follow closely  Hyponatremia. Slowly improving up to 132  Leukocytosis. Has improved from 23-14. Personally on azithromycin and meropenem. Being followed by infectious disease  Hyperglycemia. Sliding scale coverage  Mild anemia. No evidence of active bleeding  History of HIV. Last CD4 count 688, on Atripla  Critical care time 40 minutes, respiratory failure, actively weaning  Name: Dave Conrad MRN: [id number] DOB: 03-Sep-1975    ADMISSION DATE:  05/07/2016   SUBJECTIVE:   Patient status post cystoscopy and stent placement along with ureteral stricture dilation. He was left intubated. This morning he is awake alert and communicating waving to me. Denies any complaints or difficulties.  VITAL SIGNS: Temp:  [98.8 F (37.1 C)-102.1 F (38.9 C)] 98.8 F (37.1 C) (03/18 0400) Pulse Rate:  [81-127] 81 (03/18 0600) Resp:  [13-36] 14 (03/18 0600) BP: (100-167)/(72-110) 113/79 (03/18 0600) SpO2:  [92 %-100 %] 98 % (03/18 0600) FiO2 (%):  [40 %-90 %] 40 % (03/18 0328) Weight:  [120.3 kg (265 lb 3.4 oz)] 120.3 kg (265 lb 3.4 oz) (03/18 0323) HEMODYNAMICS:   VENTILATOR SETTINGS: Vent Mode: PRVC FiO2 (%):  [40 %-90 %] 40 % Set Rate:  [18 bmp-22 bmp] 22 bmp Vt Set:  [500 mL] 500 mL PEEP:  [5 cmH20] 5 cmH20 INTAKE / OUTPUT:  Intake/Output Summary (Last 24 hours) at 05/10/16 0735 Last data filed at 05/10/16 4098  Gross per 24 hour  Intake           1744.1 ml  Output             4550 ml  Net           -2805.9 ml    PHYSICAL EXAMINATION: Physical Examination:   VS: BP 113/79   Pulse 81   Temp 98.8 F (37.1 C) (Oral)   Resp 14   Ht 5\' 6"  (1.676 m)   Wt 120.3 kg (265 lb 3.4 oz)   SpO2 98%   BMI 42.81 kg/m   General Appearance: No distress  Neuro:without focal findings, mental status normal. HEENT: PERRLA, EOM intact. Pulmonary: normal breath sounds   Cardiovascular Crackles improved  Abdomen: Benign, Soft, non-tender. Renal:  No costovertebral tenderness  GU:  Not performed at this time. Endocrine: No evident thyromegaly. Skin:   warm, no rashes, no ecchymosis  Extremities: normal, no cyanosis, clubbing.   LABS:   LABORATORY PANEL:   CBC  Recent Labs Lab 05/10/16 0418  WBC 14.2*  HGB 11.6*  HCT 34.2*  PLT 165    Chemistries   Recent Labs Lab 05/08/16 1640  05/09/16 1956 05/10/16 0418  NA 130*  --  130* 132*  K 3.8  --  3.5 4.2  CL 97*  --  96* 98*  CO2 23  --  24 25  GLUCOSE 266*  --  252* 325*  BUN 17  --  17 27*  CREATININE 1.36*  < > 1.35* 1.62*  CALCIUM 8.5*  --  8.4* 8.0*  MG  --   --  2.2  --   PHOS  --   --  3.8  --   AST 45*  --   --   --   ALT 32  --   --   --   ALKPHOS 111  --   --   --   BILITOT 3.2*  --   --   --   < > = values in this interval not displayed.   Recent Labs Lab 05/09/16 1144 05/09/16 1632 05/09/16 2058 05/10/16 0005 05/10/16 0350 05/10/16 0707  GLUCAP 225* 247* 261* 282* 281* 319*    Recent Labs Lab 05/09/16 0608 05/09/16 1930  PHART 7.44 7.31*  PCO2ART 30* 50*  PO2ART 51* 85    Recent Labs Lab 05/07/16 1259 05/08/16 0123 05/08/16 1640  AST 21 46* 45*  ALT 22 30 32  ALKPHOS 81 145* 111  BILITOT 1.3* 2.4* 3.2*  ALBUMIN 3.9 3.6 3.3*    Cardiac Enzymes  Recent Labs Lab 05/08/16 1348  TROPONINI 0.07*    RADIOLOGY:  Ct Abdomen Pelvis Wo Contrast  Result Date: 05/09/2016 CLINICAL DATA:  41 year old male with dyspnea, nausea, vomiting and diarrhea for the past several days EXAM: CT  CHEST, ABDOMEN AND PELVIS WITHOUT CONTRAST TECHNIQUE: Multidetector CT imaging of the chest, abdomen and pelvis was performed following the standard protocol without IV contrast. COMPARISON:  None. FINDINGS: CT CHEST FINDINGS Cardiovascular: Limited evaluation in the absence of intravenous contrast. The heart is normal in size. No aortic aneurysm. Small volume pericardial fluid. Mediastinum/Nodes: Unremarkable CT appearance of the thyroid gland. No suspicious mediastinal or hilar adenopathy. No soft tissue mediastinal mass. The thoracic esophagus is unremarkable. Lungs/Pleura: Bilateral ground-glass attenuation airspace opacity and consolidation throughout the lungs bilaterally in a peribronchovascular distribution. Findings are most consistent with a multifocal infectious/ inflammatory process. No interlobular septal thickening, pleural effusion, pneumothorax or nodularity. Musculoskeletal: No acute fracture or aggressive appearing lytic or blastic osseous lesion. CT ABDOMEN PELVIS FINDINGS Hepatobiliary: No focal liver abnormality is seen. No gallstones, gallbladder wall thickening, or biliary dilatation. Pancreas: Unremarkable. No pancreatic ductal dilatation or surrounding inflammatory changes. Spleen: Normal in size without focal abnormality. 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 There is moderate hydronephrosis in the upper and interpolar regions. At least partially obstructing 7 x 12 mm calculus in the right mid to upper ureter. There is associated is right renal edema and mild perinephric stranding. No stones present in the left kidney. The adrenal glands are normal. Unremarkable bladder. Stomach/Bowel: No evidence of obstruction or focal bowel wall thickening. Normal appendix in the right lower quadrant. The terminal ileum is unremarkable. Vascular/Lymphatic: No significant vascular findings are present. No enlarged abdominal or pelvic lymph  nodes. Reproductive: Prostate is unremarkable. Other: Small fat containing umbilical hernia. Musculoskeletal: No acute fracture or aggressive appearing lytic or blastic osseous lesion. IMPRESSION: CT CHEST 1. Consolidative changes and ground-glass attenuation airspace opacities in a peribronchovascular distribution throughout all lobes of both lungs. The imaging appearance is most consistent with a multi lobar infectious/inflammatory process such as multi lobar pneumonia, an atypical or viral respiratory illness, or acute pneumonitis. 2. Small pericardial effusion. CT ABD/PELVIS 1. Obstructing 7 x 12 mm stone in the right proximal to mid ureter with resultant moderate right-sided hydronephrosis, renal edema and perinephric stranding. 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 2. Small fat containing umbilical hernia. Electronically Signed   By: Malachy MoanHeath  McCullough M.D.   On: 05/09/2016 13:42   Dg Chest 2 View  Result Date:  05/08/2016 CLINICAL DATA:  Shortness of breath and weakness EXAM: CHEST  2 VIEW COMPARISON:  None. FINDINGS: Cardiac shadow is within normal limits. The lungs are well aerated bilaterally. No focal infiltrate or sizable effusion is seen. Degenerative changes of thoracic spine are noted. IMPRESSION: No active cardiopulmonary disease. Electronically Signed   By: Alcide Clever M.D.   On: 05/08/2016 13:48   Ct Chest Wo Contrast  Result Date: 05/09/2016 CLINICAL DATA:  41 year old male with dyspnea, nausea, vomiting and diarrhea for the past several days EXAM: CT CHEST, ABDOMEN AND PELVIS WITHOUT CONTRAST TECHNIQUE: Multidetector CT imaging of the chest, abdomen and pelvis was performed following the standard protocol without IV contrast. COMPARISON:  None. FINDINGS: CT CHEST FINDINGS Cardiovascular: Limited evaluation in the absence of intravenous contrast. The heart is normal in size. No aortic aneurysm. Small volume pericardial  fluid. Mediastinum/Nodes: Unremarkable CT appearance of the thyroid gland. No suspicious mediastinal or hilar adenopathy. No soft tissue mediastinal mass. The thoracic esophagus is unremarkable. Lungs/Pleura: Bilateral ground-glass attenuation airspace opacity and consolidation throughout the lungs bilaterally in a peribronchovascular distribution. Findings are most consistent with a multifocal infectious/ inflammatory process. No interlobular septal thickening, pleural effusion, pneumothorax or nodularity. Musculoskeletal: No acute fracture or aggressive appearing lytic or blastic osseous lesion. CT ABDOMEN PELVIS FINDINGS Hepatobiliary: No focal liver abnormality is seen. No gallstones, gallbladder wall thickening, or biliary dilatation. Pancreas: Unremarkable. No pancreatic ductal dilatation or surrounding inflammatory changes. Spleen: Normal in size without focal abnormality. 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 There is moderate hydronephrosis in the upper and interpolar regions. At least partially obstructing 7 x 12 mm calculus in the right mid to upper ureter. There is associated is right renal edema and mild perinephric stranding. No stones present in the left kidney. The adrenal glands are normal. Unremarkable bladder. Stomach/Bowel: No evidence of obstruction or focal bowel wall thickening. Normal appendix in the right lower quadrant. The terminal ileum is unremarkable. Vascular/Lymphatic: No significant vascular findings are present. No enlarged abdominal or pelvic lymph nodes. Reproductive: Prostate is unremarkable. Other: Small fat containing umbilical hernia. Musculoskeletal: No acute fracture or aggressive appearing lytic or blastic osseous lesion. IMPRESSION: CT CHEST 1. Consolidative changes and ground-glass attenuation airspace opacities in a peribronchovascular distribution throughout all lobes of both lungs. The imaging  appearance is most consistent with a multi lobar infectious/inflammatory process such as multi lobar pneumonia, an atypical or viral respiratory illness, or acute pneumonitis. 2. Small pericardial effusion. CT ABD/PELVIS 1. Obstructing 7 x 12 mm stone in the right proximal to mid ureter with resultant moderate right-sided hydronephrosis, renal edema and perinephric stranding. 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 2. Small fat containing umbilical hernia. Electronically Signed   By: Malachy Moan M.D.   On: 05/09/2016 13:42   US Venous Img Lower Bilateral  Result Date: 05/09/2016 CLINICAL DATA:  Bilateral lower extremity edema. Hypoxemia with respiratory failure. Evaluate for DVT. EXAM: BILATERAL LOWER EXTREMITY VENOUS DOPPLER ULTRASOUND TECHNIQUE: Gray-scale sonography with graded compression, as well as color Doppler and duplex ultrasound were performed to evaluate the lower extremity deep venous systems from the level of the common femoral vein and including the common femoral, femoral, profunda femoral, popliteal and calf veins including the posterior tibial, peroneal and gastrocnemius veins when visible. The superficial great saphenous vein was also interrogated. Spectral Doppler was utilized to evaluate flow at rest and with distal augmentation maneuvers in  the common femoral, femoral and popliteal veins. COMPARISON:  None. FINDINGS: RIGHT LOWER EXTREMITY Common Femoral Vein: No evidence of thrombus. Normal compressibility, respiratory phasicity and response to augmentation. Saphenofemoral Junction: No evidence of thrombus. Normal compressibility and flow on color Doppler imaging. Profunda Femoral Vein: No evidence of thrombus. Normal compressibility and flow on color Doppler imaging. Femoral Vein: No evidence of thrombus. Normal compressibility, respiratory phasicity and response to augmentation. Popliteal Vein: No evidence of  thrombus. Normal compressibility, respiratory phasicity and response to augmentation. Calf Veins: No evidence of thrombus. Normal compressibility and flow on color Doppler imaging. Superficial Great Saphenous Vein: No evidence of thrombus. Normal compressibility and flow on color Doppler imaging. Venous Reflux:  None. Other Findings:  None. LEFT LOWER EXTREMITY Common Femoral Vein: No evidence of thrombus. Normal compressibility, respiratory phasicity and response to augmentation. Saphenofemoral Junction: No evidence of thrombus. Normal compressibility and flow on color Doppler imaging. Profunda Femoral Vein: No evidence of thrombus. Normal compressibility and flow on color Doppler imaging. Femoral Vein: No evidence of thrombus. Normal compressibility, respiratory phasicity and response to augmentation. Popliteal Vein: No evidence of thrombus. Normal compressibility, respiratory phasicity and response to augmentation. Calf Veins: No evidence of thrombus. Normal compressibility and flow on color Doppler imaging. Superficial Great Saphenous Vein: No evidence of thrombus. Normal compressibility and flow on color Doppler imaging. Venous Reflux:  None. Other Findings:  None. IMPRESSION: No evidence of DVT within either lower extremity. Electronically Signed   By: Simonne Come M.D.   On: 05/09/2016 15:22   Portable Chest Xray  Result Date: 05/09/2016 CLINICAL DATA:  Intubation.  Evaluate ET tube. EXAM: PORTABLE CHEST 1 VIEW COMPARISON:  May 08, 2016 FINDINGS: A new ET tube terminates 2.4 cm above the carina in good position. No pneumothorax. Bilateral pulmonary infiltrates are more prominent since yesterday's chest x-ray. Cardiomegaly. No other changes. IMPRESSION: The ETT is in good position. Increased prominence of bilateral infiltrates, left greater than right. Electronically Signed   By: Gerome Sam III M.D   On: 05/09/2016 20:00   US Abdomen Limited Ruq  Result Date: 05/09/2016 CLINICAL DATA:  Right  upper quadrant pain for the past 3 days. EXAM: US ABDOMEN LIMITED - RIGHT UPPER QUADRANT COMPARISON:  Chest radiograph - 05/08/2016 FINDINGS: Gallbladder: Sonographically normal. No gallbladder wall thickening or pericholecystic fluid. No echogenic gallstones or biliary sludge. Negative sonographic Murphy's sign. Common bile duct: Diameter: Normal in size measuring 3 mm in diameter Liver: Normal appearance of the liver. No discrete hepatic lesions. No intrahepatic biliary duct dilatation. No ascites. Incidentally noted trace right-sided pleural effusion (image 33) as well as potential mild dilatation involving a pole calyx (image 34). IMPRESSION: 1. Incidentally noted trace right-sided pleural effusion as well as potential mild dilatation involving an incidentally imaged right upper pole renal calyx. Clinical correlation is advised. Further evaluation could be performed with noncontrast abdominal CT as clinically indicated. 2. Otherwise, no explanation for patient's right upper quadrant abdominal pain. Specifically, no evidence of cholelithiasis/cholecystitis. Electronically Signed   By: Simonne Come M.D.   On: 05/09/2016 10:02     Tora Kindred, DO 05/10/2016

## 2016-05-10 NOTE — Consult Note (Signed)
Urology Inpatient Progress Report  Sepsis, due to unspecified organism ([place]) [A41.9] Fever, unspecified fever cause [R50.9] Intractable vomiting, presence of nausea not specified, unspecified vomiting type [R11.10] Sepsis ([place]) [A41.9]  Procedure(s): CYSTOSCOPY WITH STENT PLACEMENT and retrorade pyelogram  1 Day Post-Op   Intv/Subj: Patient remained intubated overnight, there were no acute events overnight. He remains afebrile hemodynamically stable. The patient's white blood cell count has improved significantly. The patient's FiO2 requirements are significantly better as well. He is currently on a spontaneous breathing trial.  Active Problems:   Sepsis (HCC)   Asplenia   Malnutrition of moderate degree  Current Facility-Administered Medications  Medication Dose Route Frequency Provider Last Rate Last Dose  . 0.9 %  sodium chloride infusion   Intravenous Continuous Mikael Spray, NP      . acetaminophen (TYLENOL) tablet 650 mg  650 mg Oral Q6H PRN Alexis Hugelmeyer, DO   650 mg at 05/09/16 1453   Or  . acetaminophen (TYLENOL) suppository 650 mg  650 mg Rectal Q6H PRN Alexis Hugelmeyer, DO      . aspirin EC tablet 81 mg  81 mg Oral Daily Alexis Hugelmeyer, DO   81 mg at 05/09/16 1030  . azithromycin (ZITHROMAX) tablet 250 mg  250 mg Oral Daily Loletha Grayer, MD      . bisacodyl (DULCOLAX) EC tablet 5 mg  5 mg Oral Daily PRN Alexis Hugelmeyer, DO      . bisacodyl (DULCOLAX) suppository 10 mg  10 mg Rectal Daily PRN Mikael Spray, NP      . chlorhexidine gluconate (MEDLINE KIT) (PERIDEX) 0.12 % solution 15 mL  15 mL Mouth Rinse BID Mikael Spray, NP   15 mL at 05/09/16 2000  . dexmedetomidine (PRECEDEX) 200 MCG/50ML (4 mcg/mL) infusion  0.4-1.2 mcg/kg/hr Intravenous Titrated Anders Simmonds, MD 18.2 mL/hr at 05/10/16 0700 0.6 mcg/kg/hr at 05/10/16 0700  . diltiazem (DILACOR XR) 24 hr capsule 180 mg  180 mg Oral Daily Alexis Hugelmeyer, DO   180 mg at 05/09/16 1030   . efavirenz-emtricitabine-tenofovir (ATRIPLA) 600-200-300 MG per tablet 1 tablet  1 tablet Oral QHS Alexis Hugelmeyer, DO   1 tablet at 05/08/16 2149  . fentaNYL (SUBLIMAZE) bolus via infusion 50-100 mcg  50-100 mcg Intravenous Q1H PRN Mikael Spray, NP      . fentaNYL 2567mg in NS 2528m(1057mml) infusion-PREMIX  25-400 mcg/hr Intravenous Continuous MagMikael SprayP   Stopped at 05/10/16 0743  . insulin aspart (novoLOG) injection 0-15 Units  0-15 Units Subcutaneous Q4H MagMikael SprayP   11 Units at 05/10/16 0716  . ipratropium-albuterol (DUONEB) 0.5-2.5 (3) MG/3ML nebulizer solution 3 mL  3 mL Nebulization Q6H RicLoletha GrayerD   3 mL at 05/10/16 0106  . magnesium citrate solution 1 Bottle  1 Bottle Oral Once PRN Alexis Hugelmeyer, DO      . MEDLINE mouth rinse  15 mL Mouth Rinse QID MagMikael SprayP   15 mL at 05/10/16 0405  . meropenem (MERREM) IVPB SOLR 1 g  1 g Intravenous Q8H Alexis Hugelmeyer, DO   1 g at 05/10/16 0203  . metoprolol (LOPRESSOR) injection 5 mg  5 mg Intravenous Q4H PRN PavSaundra ShellingD   5 mg at 05/08/16 0719  . midazolam (VERSED) injection 2 mg  2 mg Intravenous Q15 min PRN MagMikael SprayP   2 mg at 05/09/16 2140  . midazolam (VERSED) injection 2 mg  2 mg Intravenous Q2H PRN Magadalene S  Patria Mane, NP   2 mg at 05/09/16 2030  . ondansetron (ZOFRAN) tablet 4 mg  4 mg Oral Q6H PRN Alexis Hugelmeyer, DO       Or  . ondansetron (ZOFRAN) injection 4 mg  4 mg Intravenous Q6H PRN Alexis Hugelmeyer, DO      . oxyCODONE (Oxy IR/ROXICODONE) immediate release tablet 5 mg  5 mg Oral Q4H PRN Alexis Hugelmeyer, DO   5 mg at 05/08/16 0418  . pantoprazole (PROTONIX) injection 40 mg  40 mg Intravenous Daily Mikael Spray, NP   40 mg at 05/09/16 1945  . pravastatin (PRAVACHOL) tablet 20 mg  20 mg Oral Daily Alexis Hugelmeyer, DO   20 mg at 05/08/16 1719  . senna-docusate (Senokot-S) tablet 1 tablet  1 tablet Oral QHS PRN Alexis Hugelmeyer, DO      .  sennosides (SENOKOT) 8.8 MG/5ML syrup 5 mL  5 mL Per Tube BID PRN Mikael Spray, NP      . sodium chloride flush (NS) 0.9 % injection 3 mL  3 mL Intravenous Q12H Alexis Hugelmeyer, DO   3 mL at 05/09/16 2233  . zolpidem (AMBIEN) tablet 5 mg  5 mg Oral QHS PRN,MR X 1 Alexis Hugelmeyer, DO         Objective: Vital: Vitals:   05/10/16 0328 05/10/16 0400 05/10/16 0500 05/10/16 0600  BP:  101/79 107/81 113/79  Pulse:  87 86 81  Resp:  _0 Temp:  98.8 F (37.1 C)    TempSrc:  Oral    SpO2: 97% 97% 98% 98%  Weight:      Height:       I/Os: I/O last 3 completed shifts: In: 3583.8 [P.O.:600; I.V.:2533.8; IV Piggyback:450] Out: 5550 [Urine:5550]  Physical Exam:  Intubated, lightly Sedated, arousable Sinus rhythm, regular rate No abdominal pain to palpation Foley draining straw-colored urine  Lab Results:  Recent Labs  05/08/16 0123 05/09/16 1956 05/10/16 0418  WBC 12.5* 23.8* 14.2*  HGB 13.0 13.5 11.6*  HCT 39.2* 40.4 34.2*    Recent Labs  05/08/16 1640 05/09/16 0512 05/09/16 1956 05/10/16 0418  NA 130*  --  130* 132*  K 3.8  --  3.5 4.2  CL 97*  --  96* 98*  CO2 23  --  24 25  GLUCOSE 266*  --  252* 325*  BUN 17  --  17 27*  CREATININE 1.36* 1.43* 1.35* 1.62*  CALCIUM 8.5*  --  8.4* 8.0*    Recent Labs  05/08/16 0123 05/09/16 1956  INR 1.24 1.21   No results for input(s): LABURIN in the last 72 hours. Results for orders placed or performed during the hospital encounter of 05/07/16  Urine culture     Status: None   Collection Time: 05/07/16 12:59 PM  Result Value Ref Range Status   Specimen Description URINE, RANDOM  Final   Special Requests NONE  Final   Culture   Final    NO GROWTH Performed at [facility], [place] [address], [place], [place] [address]    Report Status 05/09/2016 FINAL  Final  Blood Culture (routine x 2)     Status: None (Preliminary result)   Collection Time: 05/07/16  9:12 PM  Result Value Ref Range Status    Specimen Description BLOOD RIGHT HAND  Final   Special Requests BOTTLES DRAWN AEROBIC AND ANAEROBIC  BCAV  Final   Culture  Setup Time   Final    GRAM NEGATIVE RODS  IN BOTH AEROBIC AND ANAEROBIC BOTTLES CRITICAL VALUE NOTED.  VALUE IS CONSISTENT WITH PREVIOUSLY REPORTED AND CALLED VALUE.    Culture GRAM NEGATIVE RODS  Final   Report Status PENDING  Incomplete  Blood Culture (routine x 2)     Status: None (Preliminary result)   Collection Time: 05/07/16  9:12 PM  Result Value Ref Range Status   Specimen Description BLOOD LEFT ANTECUBITAL  Final   Special Requests BOTTLES DRAWN AEROBIC AND ANAEROBIC BCHV  Final   Culture  Setup Time   Final    GRAM NEGATIVE RODS IN BOTH AEROBIC AND ANAEROBIC BOTTLES CRITICAL RESULT CALLED TO, READ BACK BY AND VERIFIED WITH: SHEEMA HALLAJI AT 7989 ON 05/08/2016 JLJ    Culture   Final    GRAM NEGATIVE RODS CULTURE REINCUBATED FOR BETTER GROWTH Performed at [facility], [place] [address], [place], [place] [address]    Report Status PENDING  Incomplete  Blood Culture ID Panel (Reflexed)     Status: Abnormal   Collection Time: 05/07/16  9:12 PM  Result Value Ref Range Status   Enterococcus species NOT DETECTED NOT DETECTED Final   Listeria monocytogenes NOT DETECTED NOT DETECTED Final   Staphylococcus species NOT DETECTED NOT DETECTED Final   Staphylococcus aureus NOT DETECTED NOT DETECTED Final   Streptococcus species NOT DETECTED NOT DETECTED Final   Streptococcus agalactiae NOT DETECTED NOT DETECTED Final   Streptococcus pneumoniae NOT DETECTED NOT DETECTED Final   Streptococcus pyogenes NOT DETECTED NOT DETECTED Final   Acinetobacter baumannii NOT DETECTED NOT DETECTED Final   Enterobacteriaceae species DETECTED (A) NOT DETECTED Final    Comment: Enterobacteriaceae represent a large family of gram-negative bacteria, not a single organism. CRITICAL RESULT CALLED TO, READ BACK BY AND VERIFIED WITH: SHEEMA HALLAJI AT 1740 ON 05/08/2016 JLJ     Enterobacter cloacae complex NOT DETECTED NOT DETECTED Final   Escherichia coli NOT DETECTED NOT DETECTED Final   Klebsiella oxytoca NOT DETECTED NOT DETECTED Final   Klebsiella pneumoniae NOT DETECTED NOT DETECTED Final   Proteus species DETECTED (A) NOT DETECTED Final    Comment: CRITICAL RESULT CALLED TO, READ BACK BY AND VERIFIED WITH: SHEEMA HALLAJI AT 1833 ON 05/08/2016 JLJ    Serratia marcescens NOT DETECTED NOT DETECTED Final   Carbapenem resistance NOT DETECTED NOT DETECTED Final   Haemophilus influenzae NOT DETECTED NOT DETECTED Final   Neisseria meningitidis NOT DETECTED NOT DETECTED Final   Pseudomonas aeruginosa NOT DETECTED NOT DETECTED Final   Candida albicans NOT DETECTED NOT DETECTED Final   Candida glabrata NOT DETECTED NOT DETECTED Final   Candida krusei NOT DETECTED NOT DETECTED Final   Candida parapsilosis NOT DETECTED NOT DETECTED Final   Candida tropicalis NOT DETECTED NOT DETECTED Final  Respiratory Panel by PCR     Status: None   Collection Time: 05/08/16  5:25 PM  Result Value Ref Range Status   Adenovirus NOT DETECTED NOT DETECTED Final   Coronavirus 229E NOT DETECTED NOT DETECTED Final   Coronavirus HKU1 NOT DETECTED NOT DETECTED Final   Coronavirus NL63 NOT DETECTED NOT DETECTED Final   Coronavirus OC43 NOT DETECTED NOT DETECTED Final   Metapneumovirus NOT DETECTED NOT DETECTED Final   Rhinovirus / Enterovirus NOT DETECTED NOT DETECTED Final   Influenza A NOT DETECTED NOT DETECTED Final   Influenza B NOT DETECTED NOT DETECTED Final   Parainfluenza Virus 1 NOT DETECTED NOT DETECTED Final   Parainfluenza Virus 2 NOT DETECTED NOT DETECTED Final   Parainfluenza Virus 3  NOT DETECTED NOT DETECTED Final   Parainfluenza Virus 4 NOT DETECTED NOT DETECTED Final   Respiratory Syncytial Virus NOT DETECTED NOT DETECTED Final   Bordetella pertussis NOT DETECTED NOT DETECTED Final   Chlamydophila pneumoniae NOT DETECTED NOT DETECTED Final   Mycoplasma pneumoniae NOT  DETECTED NOT DETECTED Final    Comment: Performed at [facility], [place] [address] [place], [place] [address]  MRSA PCR Screening     Status: None   Collection Time: 05/09/16  7:45 AM  Result Value Ref Range Status   MRSA by PCR NEGATIVE NEGATIVE Final    Comment:        The GeneXpert MRSA Assay (FDA approved for NASAL specimens only), is one component of a comprehensive MRSA colonization surveillance program. It is not intended to diagnose MRSA infection nor to guide or monitor treatment for MRSA infections.     Studies/Results: Ct Abdomen Pelvis Wo Contrast  Result Date: 05/09/2016 CLINICAL DATA:  41 year old male with dyspnea, nausea, vomiting and diarrhea for the past several days EXAM: CT CHEST, ABDOMEN AND PELVIS WITHOUT CONTRAST TECHNIQUE: Multidetector CT imaging of the chest, abdomen and pelvis was performed following the standard protocol without IV contrast. COMPARISON:  None. FINDINGS: CT CHEST FINDINGS Cardiovascular: Limited evaluation in the absence of intravenous contrast. The heart is normal in size. No aortic aneurysm. Small volume pericardial fluid. Mediastinum/Nodes: Unremarkable CT appearance of the thyroid gland. No suspicious mediastinal or hilar adenopathy. No soft tissue mediastinal mass. The thoracic esophagus is unremarkable. Lungs/Pleura: Bilateral ground-glass attenuation airspace opacity and consolidation throughout the lungs bilaterally in a peribronchovascular distribution. Findings are most consistent with a multifocal infectious/ inflammatory process. No interlobular septal thickening, pleural effusion, pneumothorax or nodularity. Musculoskeletal: No acute fracture or aggressive appearing lytic or blastic osseous lesion. CT ABDOMEN PELVIS FINDINGS Hepatobiliary: No focal liver abnormality is seen. No gallstones, gallbladder wall thickening, or biliary dilatation. Pancreas: Unremarkable. No pancreatic ductal dilatation or surrounding inflammatory changes.  Spleen: Normal in size without focal abnormality. 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 There is moderate hydronephrosis in the upper and interpolar regions. At least partially obstructing 7 x 12 mm calculus in the right mid to upper ureter. There is associated is right renal edema and mild perinephric stranding. No stones present in the left kidney. The adrenal glands are normal. Unremarkable bladder. Stomach/Bowel: No evidence of obstruction or focal bowel wall thickening. Normal appendix in the right lower quadrant. The terminal ileum is unremarkable. Vascular/Lymphatic: No significant vascular findings are present. No enlarged abdominal or pelvic lymph nodes. Reproductive: Prostate is unremarkable. Other: Small fat containing umbilical hernia. Musculoskeletal: No acute fracture or aggressive appearing lytic or blastic osseous lesion. IMPRESSION: CT CHEST 1. Consolidative changes and ground-glass attenuation airspace opacities in a peribronchovascular distribution throughout all lobes of both lungs. The imaging appearance is most consistent with a multi lobar infectious/inflammatory process such as multi lobar pneumonia, an atypical or viral respiratory illness, or acute pneumonitis. 2. Small pericardial effusion. CT ABD/PELVIS 1. Obstructing 7 x 12 mm stone in the right proximal to mid ureter with resultant moderate right-sided hydronephrosis, renal edema and perinephric stranding. 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 2. Small fat containing umbilical hernia. Electronically Signed   By: Jacqulynn Cadet M.D.   On: 05/09/2016 13:42   Dg Chest 2 View  Result Date: 05/08/2016 CLINICAL DATA:  Shortness of breath and weakness EXAM: CHEST  2 VIEW COMPARISON:  None. FINDINGS: Cardiac shadow is within normal limits. The lungs are well aerated bilaterally. No focal infiltrate  or sizable effusion is seen. Degenerative changes of thoracic spine are noted. IMPRESSION: No active cardiopulmonary disease. Electronically Signed   By: Inez Catalina M.D.   On: 05/08/2016 13:48   Ct Chest Wo Contrast  Result Date: 05/09/2016 CLINICAL DATA:  41 year old male with dyspnea, nausea, vomiting and diarrhea for the past several days EXAM: CT CHEST, ABDOMEN AND PELVIS WITHOUT CONTRAST TECHNIQUE: Multidetector CT imaging of the chest, abdomen and pelvis was performed following the standard protocol without IV contrast. COMPARISON:  None. FINDINGS: CT CHEST FINDINGS Cardiovascular: Limited evaluation in the absence of intravenous contrast. The heart is normal in size. No aortic aneurysm. Small volume pericardial fluid. Mediastinum/Nodes: Unremarkable CT appearance of the thyroid gland. No suspicious mediastinal or hilar adenopathy. No soft tissue mediastinal mass. The thoracic esophagus is unremarkable. Lungs/Pleura: Bilateral ground-glass attenuation airspace opacity and consolidation throughout the lungs bilaterally in a peribronchovascular distribution. Findings are most consistent with a multifocal infectious/ inflammatory process. No interlobular septal thickening, pleural effusion, pneumothorax or nodularity. Musculoskeletal: No acute fracture or aggressive appearing lytic or blastic osseous lesion. CT ABDOMEN PELVIS FINDINGS Hepatobiliary: No focal liver abnormality is seen. No gallstones, gallbladder wall thickening, or biliary dilatation. Pancreas: Unremarkable. No pancreatic ductal dilatation or surrounding inflammatory changes. Spleen: Normal in size without focal abnormality. 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 There is moderate hydronephrosis in the upper and interpolar regions. At least partially obstructing 7 x 12 mm calculus in the right mid to upper ureter. There is associated is right renal edema and mild perinephric  stranding. No stones present in the left kidney. The adrenal glands are normal. Unremarkable bladder. Stomach/Bowel: No evidence of obstruction or focal bowel wall thickening. Normal appendix in the right lower quadrant. The terminal ileum is unremarkable. Vascular/Lymphatic: No significant vascular findings are present. No enlarged abdominal or pelvic lymph nodes. Reproductive: Prostate is unremarkable. Other: Small fat containing umbilical hernia. Musculoskeletal: No acute fracture or aggressive appearing lytic or blastic osseous lesion. IMPRESSION: CT CHEST 1. Consolidative changes and ground-glass attenuation airspace opacities in a peribronchovascular distribution throughout all lobes of both lungs. The imaging appearance is most consistent with a multi lobar infectious/inflammatory process such as multi lobar pneumonia, an atypical or viral respiratory illness, or acute pneumonitis. 2. Small pericardial effusion. CT ABD/PELVIS 1. Obstructing 7 x 12 mm stone in the right proximal to mid ureter with resultant moderate right-sided hydronephrosis, renal edema and perinephric stranding. 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 2. Small fat containing umbilical hernia. Electronically Signed   By: Jacqulynn Cadet M.D.   On: 05/09/2016 13:42   US Venous Img Lower Bilateral  Result Date: 05/09/2016 CLINICAL DATA:  Bilateral lower extremity edema. Hypoxemia with respiratory failure. Evaluate for DVT. EXAM: BILATERAL LOWER EXTREMITY VENOUS DOPPLER ULTRASOUND TECHNIQUE: Gray-scale sonography with graded compression, as well as color Doppler and duplex ultrasound were performed to evaluate the lower extremity deep venous systems from the level of the common femoral vein and including the common femoral, femoral, profunda femoral, popliteal and calf veins including the posterior tibial, peroneal and gastrocnemius veins when visible. The superficial  great saphenous vein was also interrogated. Spectral Doppler was utilized to evaluate flow at rest and with distal augmentation maneuvers in the common femoral, femoral and popliteal veins. COMPARISON:  None. FINDINGS:  RIGHT LOWER EXTREMITY Common Femoral Vein: No evidence of thrombus. Normal compressibility, respiratory phasicity and response to augmentation. Saphenofemoral Junction: No evidence of thrombus. Normal compressibility and flow on color Doppler imaging. Profunda Femoral Vein: No evidence of thrombus. Normal compressibility and flow on color Doppler imaging. Femoral Vein: No evidence of thrombus. Normal compressibility, respiratory phasicity and response to augmentation. Popliteal Vein: No evidence of thrombus. Normal compressibility, respiratory phasicity and response to augmentation. Calf Veins: No evidence of thrombus. Normal compressibility and flow on color Doppler imaging. Superficial Great Saphenous Vein: No evidence of thrombus. Normal compressibility and flow on color Doppler imaging. Venous Reflux:  None. Other Findings:  None. LEFT LOWER EXTREMITY Common Femoral Vein: No evidence of thrombus. Normal compressibility, respiratory phasicity and response to augmentation. Saphenofemoral Junction: No evidence of thrombus. Normal compressibility and flow on color Doppler imaging. Profunda Femoral Vein: No evidence of thrombus. Normal compressibility and flow on color Doppler imaging. Femoral Vein: No evidence of thrombus. Normal compressibility, respiratory phasicity and response to augmentation. Popliteal Vein: No evidence of thrombus. Normal compressibility, respiratory phasicity and response to augmentation. Calf Veins: No evidence of thrombus. Normal compressibility and flow on color Doppler imaging. Superficial Great Saphenous Vein: No evidence of thrombus. Normal compressibility and flow on color Doppler imaging. Venous Reflux:  None. Other Findings:  None. IMPRESSION: No evidence of DVT within  either lower extremity. Electronically Signed   By: Sandi Mariscal M.D.   On: 05/09/2016 15:22   Portable Chest Xray  Result Date: 05/09/2016 CLINICAL DATA:  Intubation.  Evaluate ET tube. EXAM: PORTABLE CHEST 1 VIEW COMPARISON:  May 08, 2016 FINDINGS: A new ET tube terminates 2.4 cm above the carina in good position. No pneumothorax. Bilateral pulmonary infiltrates are more prominent since yesterday's chest x-ray. Cardiomegaly. No other changes. IMPRESSION: The ETT is in good position. Increased prominence of bilateral infiltrates, left greater than right. Electronically Signed   By: Dorise Bullion III M.D   On: 05/09/2016 20:00   US Abdomen Limited Ruq  Result Date: 05/09/2016 CLINICAL DATA:  Right upper quadrant pain for the past 3 days. EXAM: US ABDOMEN LIMITED - RIGHT UPPER QUADRANT COMPARISON:  Chest radiograph - 05/08/2016 FINDINGS: Gallbladder: Sonographically normal. No gallbladder wall thickening or pericholecystic fluid. No echogenic gallstones or biliary sludge. Negative sonographic Murphy's sign. Common bile duct: Diameter: Normal in size measuring 3 mm in diameter Liver: Normal appearance of the liver. No discrete hepatic lesions. No intrahepatic biliary duct dilatation. No ascites. Incidentally noted trace right-sided pleural effusion (image 33) as well as potential mild dilatation involving a pole calyx (image 34). IMPRESSION: 1. Incidentally noted trace right-sided pleural effusion as well as potential mild dilatation involving an incidentally imaged right upper pole renal calyx. Clinical correlation is advised. Further evaluation could be performed with noncontrast abdominal CT as clinically indicated. 2. Otherwise, no explanation for patient's right upper quadrant abdominal pain. Specifically, no evidence of cholelithiasis/cholecystitis. Electronically Signed   By: Sandi Mariscal M.D.   On: 05/09/2016 10:02    Assessment: Procedure(s):  CYSTOSCOPY WITH STENT PLACEMENT and retrorade  pyelogram, 1 Day Post-Op.  Septic secondary to obstructing stone.Now that the patient has source control, I expect that he will continue to improve.  The patient has a urine culture pending from the operating room, there was  Large amount of purulent reflux once the stone was bypassed, his culture however may still be negative given his antibiotics prior.However, there was no question that he had an infection behind stone.  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 Ultimately, the patient will need a PCNL, this would obviously be arranged once the patient has recovered fully from his pneumonia and his septic episode.  The patient had a urethral stricture, this is likely secondary to a STD, Given the patient's history. This was dilated in the OR.  Plan: I will leave theantibiotic to infectious disease, and the medical management to his primary team. His Foley catheter should remain for 72 hours prior to him having a voiding trial. If the patient is being discharged prior to this e can organize a voiding trial in the urology clinic. I will schedule the patient to see Dr. Erlene Quan in clinic, to discuss PCNL, likely in 2 weeks. We will continue to follow from a far While the patient is in the hospital, but please contact us directly for specific questions.   Louis Meckel, MD Urology 05/10/2016, 8:05 AM

## 2016-05-10 NOTE — Progress Notes (Signed)
Rested well overnight.  Patient more comfortable since getting personal fan.  On fentanyl and precedex drips.  Follows commands and writes on clipboard to communicate.  VSS.  NSR per cardiac monitor. 700 cc UOP over the last 12 hours. Plan to wean off ventilator this morning.

## 2016-05-10 NOTE — Progress Notes (Signed)
Patient ID: Dave Conrad, male   DOB: 12-21-1975, 41 y.o.   MRN: [id number]   [employer] [facility] PROGRESS NOTE  Dave Conrad [id number] DOB: 12-21-1975 DOA: 05/07/2016 PCP: [facility]  HPI/Subjective: Patient feeling much better. He was extubated this morning. Now on 3 L.  Patient never had any abdominal pain or back pain. Patient had a ureter stent for obstructing kidney stone last evening.  Objective: Vitals:   05/10/16 1200 05/10/16 1300  BP: (!) 131/93 128/86  Pulse: 88 86  Resp: (!) 33 (!) 27  Temp:      Filed Weights   05/07/16 2226 05/08/16 0057 05/10/16 0323  Weight: 120.2 kg (265 lb) 121.4 kg (267 lb 9.6 oz) 120.3 kg (265 lb 3.4 oz)    ROS: Review of Systems  Constitutional: Negative for chills and fever.  Eyes: Negative for blurred vision.  Respiratory: Positive for cough and shortness of breath.   Cardiovascular: Negative for chest pain.  Gastrointestinal: Negative for abdominal pain, constipation, diarrhea, nausea and vomiting.  Genitourinary: Negative for dysuria.  Musculoskeletal: Negative for joint pain.  Neurological: Negative for dizziness and headaches.   Exam: Physical Exam  Constitutional: He is oriented to person, place, and time.  HENT:  Nose: No mucosal edema.  Mouth/Throat: No oropharyngeal exudate or posterior oropharyngeal edema.  Eyes: Conjunctivae, EOM and lids are normal. Pupils are equal, round, and reactive to light.  Neck: No JVD present. Carotid bruit is not present. No edema present. No thyroid mass and no thyromegaly present.  Cardiovascular: S1 normal and S2 normal.  Exam reveals no gallop.   No murmur heard. Pulses:      Dorsalis pedis pulses are 2+ on the right side, and 2+ on the left side.  Respiratory: No respiratory distress. He has decreased breath sounds in the right lower field and the left lower field. He has no wheezes. He has no rhonchi. He has no rales.  GI: Soft. Bowel sounds are normal. There is no  tenderness.  Musculoskeletal:       Right ankle: He exhibits swelling.       Left ankle: He exhibits swelling.  Lymphadenopathy:    He has no cervical adenopathy.  Neurological: He is alert and oriented to person, place, and time. No cranial nerve deficit.  Skin: Skin is warm. No rash noted. Nails show no clubbing.  Psychiatric: He has a normal mood and affect.      Data Reviewed: Basic Metabolic Panel:  Recent Labs Lab 05/07/16 1259 05/08/16 0123 05/08/16 1640 05/09/16 0512 05/09/16 1956 05/10/16 0418  NA 129* 130* 130*  --  130* 132*  K 4.0 4.0 3.8  --  3.5 4.2  CL 95* 93* 97*  --  96* 98*  CO2 24 24 23   --  24 25  GLUCOSE 301* 305* 266*  --  252* 325*  BUN 14 18 17   --  17 27*  CREATININE 1.12 1.48* 1.36* 1.43* 1.35* 1.62*  CALCIUM 8.9 8.5* 8.5*  --  8.4* 8.0*  MG  --   --   --   --  2.2  --   PHOS  --   --   --   --  3.8  --    Liver Function Tests:  Recent Labs Lab 05/07/16 1259 05/08/16 0123 05/08/16 1640  AST 21 46* 45*  ALT 22 30 32  ALKPHOS 81 145* 111  BILITOT 1.3* 2.4* 3.2*  PROT 8.1 7.8 7.3  ALBUMIN 3.9 3.6 3.3*  Recent Labs Lab 05/07/16 1259 05/08/16 1640  LIPASE <10* <10*   CBC:  Recent Labs Lab 05/07/16 1507 05/08/16 0123 05/09/16 1956 05/10/16 0418  WBC 16.4* 12.5* 23.8* 14.2*  NEUTROABS 14.9* 12.1*  --   --   HGB 13.8 13.0 13.5 11.6*  HCT 40.6 39.2* 40.4 34.2*  MCV 80.6 80.6 80.6 79.8*  PLT 162 171 172 165   Cardiac Enzymes:  Recent Labs Lab 05/07/16 1507 05/07/16 2111 05/07/16 2334 05/08/16 0123 05/08/16 0823 05/08/16 1348  CKTOTAL 252  --   --   --   --   --   TROPONINI  --  0.10* 0.09* 0.08* 0.14* 0.07*    CBG:  Recent Labs Lab 05/09/16 2058 05/10/16 0005 05/10/16 0350 05/10/16 0707 05/10/16 1115  GLUCAP 261* 282* 281* 319* 322*    Recent Results (from the past 240 hour(s))  Urine culture     Status: None   Collection Time: 05/07/16 12:59 PM  Result Value Ref Range Status   Specimen Description  URINE, RANDOM  Final   Special Requests NONE  Final   Culture   Final    NO GROWTH Performed at [facility], [address], [place], [place] [address]    Report Status 05/09/2016 FINAL  Final  Blood Culture (routine x 2)     Status: None (Preliminary result)   Collection Time: 05/07/16  9:12 PM  Result Value Ref Range Status   Specimen Description BLOOD RIGHT HAND  Final   Special Requests BOTTLES DRAWN AEROBIC AND ANAEROBIC  BCAV  Final   Culture  Setup Time   Final    GRAM NEGATIVE RODS IN BOTH AEROBIC AND ANAEROBIC BOTTLES CRITICAL VALUE NOTED.  VALUE IS CONSISTENT WITH PREVIOUSLY REPORTED AND CALLED VALUE.    Culture GRAM NEGATIVE RODS  Final   Report Status PENDING  Incomplete  Blood Culture (routine x 2)     Status: None (Preliminary result)   Collection Time: 05/07/16  9:12 PM  Result Value Ref Range Status   Specimen Description BLOOD LEFT ANTECUBITAL  Final   Special Requests BOTTLES DRAWN AEROBIC AND ANAEROBIC BCHV  Final   Culture  Setup Time   Final    GRAM NEGATIVE RODS IN BOTH AEROBIC AND ANAEROBIC BOTTLES CRITICAL RESULT CALLED TO, READ BACK BY AND VERIFIED WITH: SHEEMA HALLAJI AT 1833 ON 05/08/2016 JLJ    Culture   Final    GRAM NEGATIVE RODS CULTURE REINCUBATED FOR BETTER GROWTH Performed at [facility], [address] [place], [place] [address]    Report Status PENDING  Incomplete  Blood Culture ID Panel (Reflexed)     Status: Abnormal   Collection Time: 05/07/16  9:12 PM  Result Value Ref Range Status   Enterococcus species NOT DETECTED NOT DETECTED Final   Listeria monocytogenes NOT DETECTED NOT DETECTED Final   Staphylococcus species NOT DETECTED NOT DETECTED Final   Staphylococcus aureus NOT DETECTED NOT DETECTED Final   Streptococcus species NOT DETECTED NOT DETECTED Final   Streptococcus agalactiae NOT DETECTED NOT DETECTED Final   Streptococcus pneumoniae NOT DETECTED NOT DETECTED Final   Streptococcus pyogenes NOT DETECTED NOT  DETECTED Final   Acinetobacter baumannii NOT DETECTED NOT DETECTED Final   Enterobacteriaceae species DETECTED (A) NOT DETECTED Final    Comment: Enterobacteriaceae represent a large family of gram-negative bacteria, not a single organism. CRITICAL RESULT CALLED TO, READ BACK BY AND VERIFIED WITH: SHEEMA HALLAJI AT 1833 ON 05/08/2016 JLJ    Enterobacter  cloacae complex NOT DETECTED NOT DETECTED Final   Escherichia coli NOT DETECTED NOT DETECTED Final   Klebsiella oxytoca NOT DETECTED NOT DETECTED Final   Klebsiella pneumoniae NOT DETECTED NOT DETECTED Final   Proteus species DETECTED (A) NOT DETECTED Final    Comment: CRITICAL RESULT CALLED TO, READ BACK BY AND VERIFIED WITH: SHEEMA HALLAJI AT 1833 ON 05/08/2016 JLJ    Serratia marcescens NOT DETECTED NOT DETECTED Final   Carbapenem resistance NOT DETECTED NOT DETECTED Final   Haemophilus influenzae NOT DETECTED NOT DETECTED Final   Neisseria meningitidis NOT DETECTED NOT DETECTED Final   Pseudomonas aeruginosa NOT DETECTED NOT DETECTED Final   Candida albicans NOT DETECTED NOT DETECTED Final   Candida glabrata NOT DETECTED NOT DETECTED Final   Candida krusei NOT DETECTED NOT DETECTED Final   Candida parapsilosis NOT DETECTED NOT DETECTED Final   Candida tropicalis NOT DETECTED NOT DETECTED Final  Respiratory Panel by PCR     Status: None   Collection Time: 05/08/16  5:25 PM  Result Value Ref Range Status   Adenovirus NOT DETECTED NOT DETECTED Final   Coronavirus 229E NOT DETECTED NOT DETECTED Final   Coronavirus HKU1 NOT DETECTED NOT DETECTED Final   Coronavirus NL63 NOT DETECTED NOT DETECTED Final   Coronavirus OC43 NOT DETECTED NOT DETECTED Final   Metapneumovirus NOT DETECTED NOT DETECTED Final   Rhinovirus / Enterovirus NOT DETECTED NOT DETECTED Final   Influenza A NOT DETECTED NOT DETECTED Final   Influenza B NOT DETECTED NOT DETECTED Final   Parainfluenza Virus 1 NOT DETECTED NOT DETECTED Final   Parainfluenza Virus 2 NOT  DETECTED NOT DETECTED Final   Parainfluenza Virus 3 NOT DETECTED NOT DETECTED Final   Parainfluenza Virus 4 NOT DETECTED NOT DETECTED Final   Respiratory Syncytial Virus NOT DETECTED NOT DETECTED Final   Bordetella pertussis NOT DETECTED NOT DETECTED Final   Chlamydophila pneumoniae NOT DETECTED NOT DETECTED Final   Mycoplasma pneumoniae NOT DETECTED NOT DETECTED Final    Comment: Performed at [facility], [address], [place], [place] [address]  MRSA PCR Screening     Status: None   Collection Time: 05/09/16  7:45 AM  Result Value Ref Range Status   MRSA by PCR NEGATIVE NEGATIVE Final    Comment:        The GeneXpert MRSA Assay (FDA approved for NASAL specimens only), is one component of a comprehensive MRSA colonization surveillance program. It is not intended to diagnose MRSA infection nor to guide or monitor treatment for MRSA infections.      Studies: Ct Abdomen Pelvis Wo Contrast  Result Date: 05/09/2016 CLINICAL DATA:  41 year old male with dyspnea, nausea, vomiting and diarrhea for the past several days EXAM: CT CHEST, ABDOMEN AND PELVIS WITHOUT CONTRAST TECHNIQUE: Multidetector CT imaging of the chest, abdomen and pelvis was performed following the standard protocol without IV contrast. COMPARISON:  None. FINDINGS: CT CHEST FINDINGS Cardiovascular: Limited evaluation in the absence of intravenous contrast. The heart is normal in size. No aortic aneurysm. Small volume pericardial fluid. Mediastinum/Nodes: Unremarkable CT appearance of the thyroid gland. No suspicious mediastinal or hilar adenopathy. No soft tissue mediastinal mass. The thoracic esophagus is unremarkable. Lungs/Pleura: Bilateral ground-glass attenuation airspace opacity and consolidation throughout the lungs bilaterally in a peribronchovascular distribution. Findings are most consistent with a multifocal infectious/ inflammatory process. No interlobular septal thickening, pleural effusion, pneumothorax or  nodularity. Musculoskeletal: No acute fracture or aggressive appearing lytic or blastic osseous lesion. CT ABDOMEN PELVIS FINDINGS Hepatobiliary: No  focal liver abnormality is seen. No gallstones, gallbladder wall thickening, or biliary dilatation. Pancreas: Unremarkable. No pancreatic ductal dilatation or surrounding inflammatory changes. Spleen: Normal in size without focal abnormality. 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 There is moderate hydronephrosis in the upper and interpolar regions. At least partially obstructing 7 x 12 mm calculus in the right mid to upper ureter. There is associated is right renal edema and mild perinephric stranding. No stones present in the left kidney. The adrenal glands are normal. Unremarkable bladder. Stomach/Bowel: No evidence of obstruction or focal bowel wall thickening. Normal appendix in the right lower quadrant. The terminal ileum is unremarkable. Vascular/Lymphatic: No significant vascular findings are present. No enlarged abdominal or pelvic lymph nodes. Reproductive: Prostate is unremarkable. Other: Small fat containing umbilical hernia. Musculoskeletal: No acute fracture or aggressive appearing lytic or blastic osseous lesion. IMPRESSION: CT CHEST 1. Consolidative changes and ground-glass attenuation airspace opacities in a peribronchovascular distribution throughout all lobes of both lungs. The imaging appearance is most consistent with a multi lobar infectious/inflammatory process such as multi lobar pneumonia, an atypical or viral respiratory illness, or acute pneumonitis. 2. Small pericardial effusion. CT ABD/PELVIS 1. Obstructing 7 x 12 mm stone in the right proximal to mid ureter with resultant moderate right-sided hydronephrosis, renal edema and perinephric stranding. 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 2. Small fat  containing umbilical hernia. Electronically Signed   By: Malachy Moan M.D.   On: 05/09/2016 13:42   Ct Chest Wo Contrast  Result Date: 05/09/2016 CLINICAL DATA:  41 year old male with dyspnea, nausea, vomiting and diarrhea for the past several days EXAM: CT CHEST, ABDOMEN AND PELVIS WITHOUT CONTRAST TECHNIQUE: Multidetector CT imaging of the chest, abdomen and pelvis was performed following the standard protocol without IV contrast. COMPARISON:  None. FINDINGS: CT CHEST FINDINGS Cardiovascular: Limited evaluation in the absence of intravenous contrast. The heart is normal in size. No aortic aneurysm. Small volume pericardial fluid. Mediastinum/Nodes: Unremarkable CT appearance of the thyroid gland. No suspicious mediastinal or hilar adenopathy. No soft tissue mediastinal mass. The thoracic esophagus is unremarkable. Lungs/Pleura: Bilateral ground-glass attenuation airspace opacity and consolidation throughout the lungs bilaterally in a peribronchovascular distribution. Findings are most consistent with a multifocal infectious/ inflammatory process. No interlobular septal thickening, pleural effusion, pneumothorax or nodularity. Musculoskeletal: No acute fracture or aggressive appearing lytic or blastic osseous lesion. CT ABDOMEN PELVIS FINDINGS Hepatobiliary: No focal liver abnormality is seen. No gallstones, gallbladder wall thickening, or biliary dilatation. Pancreas: Unremarkable. No pancreatic ductal dilatation or surrounding inflammatory changes. Spleen: Normal in size without focal abnormality. 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 There is moderate hydronephrosis in the upper and interpolar regions. At least partially obstructing 7 x 12 mm calculus in the right mid to upper ureter. There is associated is right renal edema and mild perinephric stranding. No stones present in the left kidney. The adrenal glands are normal. Unremarkable  bladder. Stomach/Bowel: No evidence of obstruction or focal bowel wall thickening. Normal appendix in the right lower quadrant. The terminal ileum is unremarkable. Vascular/Lymphatic: No significant vascular findings are present. No enlarged abdominal or pelvic lymph nodes. Reproductive: Prostate is unremarkable. Other: Small fat containing umbilical hernia. Musculoskeletal: No acute fracture or aggressive appearing lytic or blastic osseous lesion. IMPRESSION: CT CHEST 1. Consolidative changes and ground-glass attenuation airspace opacities in a peribronchovascular distribution throughout all lobes of both lungs. The  imaging appearance is most consistent with a multi lobar infectious/inflammatory process such as multi lobar pneumonia, an atypical or viral respiratory illness, or acute pneumonitis. 2. Small pericardial effusion. CT ABD/PELVIS 1. Obstructing 7 x 12 mm stone in the right proximal to mid ureter with resultant moderate right-sided hydronephrosis, renal edema and perinephric stranding. 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 2. Small fat containing umbilical hernia. Electronically Signed   By: Malachy Moan M.D.   On: 05/09/2016 13:42   US Venous Img Lower Bilateral  Result Date: 05/09/2016 CLINICAL DATA:  Bilateral lower extremity edema. Hypoxemia with respiratory failure. Evaluate for DVT. EXAM: BILATERAL LOWER EXTREMITY VENOUS DOPPLER ULTRASOUND TECHNIQUE: Gray-scale sonography with graded compression, as well as color Doppler and duplex ultrasound were performed to evaluate the lower extremity deep venous systems from the level of the common femoral vein and including the common femoral, femoral, profunda femoral, popliteal and calf veins including the posterior tibial, peroneal and gastrocnemius veins when visible. The superficial great saphenous vein was also interrogated. Spectral Doppler was utilized to evaluate flow at  rest and with distal augmentation maneuvers in the common femoral, femoral and popliteal veins. COMPARISON:  None. FINDINGS: RIGHT LOWER EXTREMITY Common Femoral Vein: No evidence of thrombus. Normal compressibility, respiratory phasicity and response to augmentation. Saphenofemoral Junction: No evidence of thrombus. Normal compressibility and flow on color Doppler imaging. Profunda Femoral Vein: No evidence of thrombus. Normal compressibility and flow on color Doppler imaging. Femoral Vein: No evidence of thrombus. Normal compressibility, respiratory phasicity and response to augmentation. Popliteal Vein: No evidence of thrombus. Normal compressibility, respiratory phasicity and response to augmentation. Calf Veins: No evidence of thrombus. Normal compressibility and flow on color Doppler imaging. Superficial Great Saphenous Vein: No evidence of thrombus. Normal compressibility and flow on color Doppler imaging. Venous Reflux:  None. Other Findings:  None. LEFT LOWER EXTREMITY Common Femoral Vein: No evidence of thrombus. Normal compressibility, respiratory phasicity and response to augmentation. Saphenofemoral Junction: No evidence of thrombus. Normal compressibility and flow on color Doppler imaging. Profunda Femoral Vein: No evidence of thrombus. Normal compressibility and flow on color Doppler imaging. Femoral Vein: No evidence of thrombus. Normal compressibility, respiratory phasicity and response to augmentation. Popliteal Vein: No evidence of thrombus. Normal compressibility, respiratory phasicity and response to augmentation. Calf Veins: No evidence of thrombus. Normal compressibility and flow on color Doppler imaging. Superficial Great Saphenous Vein: No evidence of thrombus. Normal compressibility and flow on color Doppler imaging. Venous Reflux:  None. Other Findings:  None. IMPRESSION: No evidence of DVT within either lower extremity. Electronically Signed   By: Simonne Come M.D.   On: 05/09/2016 15:22    Portable Chest Xray  Result Date: 05/09/2016 CLINICAL DATA:  Intubation.  Evaluate ET tube. EXAM: PORTABLE CHEST 1 VIEW COMPARISON:  May 08, 2016 FINDINGS: A new ET tube terminates 2.4 cm above the carina in good position. No pneumothorax. Bilateral pulmonary infiltrates are more prominent since yesterday's chest x-ray. Cardiomegaly. No other changes. IMPRESSION: The ETT is in good position. Increased prominence of bilateral infiltrates, left greater than right. Electronically Signed   By: Gerome Sam III M.D   On: 05/09/2016 20:00   US Abdomen Limited Ruq  Result Date: 05/09/2016 CLINICAL DATA:  Right upper quadrant pain for the past 3 days. EXAM: US ABDOMEN LIMITED - RIGHT UPPER QUADRANT COMPARISON:  Chest radiograph - 05/08/2016 FINDINGS: Gallbladder: Sonographically normal. No gallbladder wall thickening or pericholecystic fluid. No  echogenic gallstones or biliary sludge. Negative sonographic Murphy's sign. Common bile duct: Diameter: Normal in size measuring 3 mm in diameter Liver: Normal appearance of the liver. No discrete hepatic lesions. No intrahepatic biliary duct dilatation. No ascites. Incidentally noted trace right-sided pleural effusion (image 33) as well as potential mild dilatation involving a pole calyx (image 34). IMPRESSION: 1. Incidentally noted trace right-sided pleural effusion as well as potential mild dilatation involving an incidentally imaged right upper pole renal calyx. Clinical correlation is advised. Further evaluation could be performed with noncontrast abdominal CT as clinically indicated. 2. Otherwise, no explanation for patient's right upper quadrant abdominal pain. Specifically, no evidence of cholelithiasis/cholecystitis. Electronically Signed   By: Simonne Come M.D.   On: 05/09/2016 10:02    Scheduled Meds: . aspirin EC  81 mg Oral Daily  . azithromycin  250 mg Oral Daily  . diltiazem  180 mg Oral Daily  . efavirenz-emtricitabine-tenofovir  1 tablet Oral  QHS  . glipiZIDE  10 mg Oral Q breakfast  . insulin aspart  0-5 Units Subcutaneous QHS  . insulin aspart  0-9 Units Subcutaneous TID WC  . ipratropium-albuterol  3 mL Nebulization Q6H  . meropenem  1 g Intravenous Q8H  . pantoprazole  40 mg Oral QAC breakfast  . pravastatin  20 mg Oral Daily  . sodium chloride flush  3 mL Intravenous Q12H    Assessment/Plan:  1. Clinical sepsis, Pneumonia seen on CT scan and not on 2 x-rays. Pro-calcitonin very high. Blood cultures positive for Proteus.  Continue meropenem. In the Zithromax for atypical coverage for pneumonia also. 2. Acute respiratory failure with hypoxia. Patient extubated this morning. Now on 3 L of oxygen. Responded well to IV Lasix dose given yesterday. 3. Obstructing kidney stone. Requiring urgent stenting procedure last evening. As per urology, Foley catheter to remain in for 72 hours. 4. 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 5. Acute kidney injury. Should improve after ureter stent. Continue to hold lisinopril for now. 6. Hyperlipidemia unspecified on pravastatin 7. Essential hypertension on diltiazem  8. Type 2 diabetes mellitus with hyperglycemia. Restarted glipizide. Put on sliding scale. Add low-dose L 9. Elevated bilirubin. Right upper quadrant sonogram negative 10. Elevated troponin demand ischemia from sepsis  Code Status:     Code Status Orders        Start     Ordered   05/08/16 0043  Full code  Continuous     05/08/16 0043    Code Status History    Date Active Date Inactive Code Status Order ID Comments User Context   This patient has a current code status but no historical code status.     Disposition Plan: To be determined  Consultants:  Infectious disease  Antibiotics:  Meropenem  Zithromax  Time spent: 26 minutes  Alford Highland  [employer]

## 2016-05-10 NOTE — Progress Notes (Signed)
Pt. Extubated and placed on 4 liters of oxygen,no apparent distree at this time

## 2016-05-10 NOTE — Anesthesia Postprocedure Evaluation (Signed)
Anesthesia Post Note  Patient: Dave Conrad  Procedure(s) Performed: Procedure(s) (LRB): CYSTOSCOPY WITH STENT PLACEMENT and retrorade pyelogram (Right)  Patient location during evaluation: SICU Anesthesia Type: General Level of consciousness: sedated Pain management: pain level controlled Vital Signs Assessment: post-procedure vital signs reviewed and stable Respiratory status: patient remains intubated per anesthesia plan Cardiovascular status: stable Anesthetic complications: no Comments: Plan to extubate today      Last Vitals:  Vitals:   05/10/16 0500 05/10/16 0600  BP: 107/81 113/79  Pulse: 86 81  Resp: 13 14  Temp:      Last Pain:  Vitals:   05/10/16 0400  TempSrc: Oral  PainSc:                  Cleda MccreedyJoseph K Cherryl Babin

## 2016-05-10 NOTE — Progress Notes (Signed)
Pt successfully extubated to 4LNC- SpO2 > 95%. Lung sounds with bilat rhonchi. NSR on cardiac monitor. Pt has a strong, productive cough. Alert and oriented. No c/o pain. Foley draining pink, tinged urine.

## 2016-05-11 ENCOUNTER — Encounter: Payer: Self-pay | Admitting: Urology

## 2016-05-11 DIAGNOSIS — Z01818 Encounter for other preprocedural examination: Secondary | ICD-10-CM

## 2016-05-11 DIAGNOSIS — R1110 Vomiting, unspecified: Secondary | ICD-10-CM

## 2016-05-11 DIAGNOSIS — R0902 Hypoxemia: Secondary | ICD-10-CM

## 2016-05-11 DIAGNOSIS — R0600 Dyspnea, unspecified: Secondary | ICD-10-CM

## 2016-05-11 DIAGNOSIS — R509 Fever, unspecified: Secondary | ICD-10-CM

## 2016-05-11 DIAGNOSIS — R609 Edema, unspecified: Secondary | ICD-10-CM

## 2016-05-11 DIAGNOSIS — A419 Sepsis, unspecified organism: Secondary | ICD-10-CM

## 2016-05-11 DIAGNOSIS — R112 Nausea with vomiting, unspecified: Secondary | ICD-10-CM

## 2016-05-11 DIAGNOSIS — R109 Unspecified abdominal pain: Secondary | ICD-10-CM

## 2016-05-11 DIAGNOSIS — R1084 Generalized abdominal pain: Secondary | ICD-10-CM

## 2016-05-11 LAB — CULTURE, BLOOD (ROUTINE X 2)

## 2016-05-11 LAB — GLUCOSE, CAPILLARY
GLUCOSE-CAPILLARY: 237 mg/dL — AB (ref 65–99)
GLUCOSE-CAPILLARY: 155 mg/dL — AB (ref 65–99)
Glucose-Capillary: 107 mg/dL — ABNORMAL HIGH (ref 65–99)
Glucose-Capillary: 107 mg/dL — ABNORMAL HIGH (ref 65–99)

## 2016-05-11 LAB — PROCALCITONIN: Procalcitonin: 66.9 ng/mL

## 2016-05-11 LAB — URINE CULTURE

## 2016-05-11 MED ORDER — DEXTROSE 5 % IV SOLN: 2.0000 g | INTRAVENOUS | Status: DC

## 2016-05-11 MED ORDER — INSULIN GLARGINE 100 UNIT/ML ~~LOC~~ SOLN
14.0000 [IU] | Freq: Every day | SUBCUTANEOUS | Status: DC
  Filled 2016-05-11 (×2): qty 0.14

## 2016-05-11 MED ORDER — CEFTRIAXONE SODIUM-DEXTROSE 2-2.22 GM-% IV SOLR
2.0000 g | INTRAVENOUS | Status: DC
  Filled 2016-05-11: qty 50

## 2016-05-11 MED ORDER — DEXTROSE 5 % IV SOLN
2.0000 g | INTRAVENOUS | Status: DC
  Administered 2016-05-11: 2 g via INTRAVENOUS
  Filled 2016-05-11 (×2): qty 2

## 2016-05-11 MED ORDER — IPRATROPIUM-ALBUTEROL 0.5-2.5 (3) MG/3ML IN SOLN: 3.0000 mL | Freq: Four times a day (QID) | RESPIRATORY_TRACT | Status: DC | PRN

## 2016-05-11 MED ORDER — FLUTICASONE PROPIONATE 50 MCG/ACT NA SUSP
2.0000 | Freq: Every day | NASAL | Status: DC
  Administered 2016-05-11: 2 via NASAL
  Filled 2016-05-11: qty 16

## 2016-05-11 NOTE — Progress Notes (Signed)
[facility] CLINIC INFECTIOUS DISEASE PROGRESS NOTE Date of Admission:  05/07/2016     ID: Dave Conrad is a 41 y.o. male with  HIV, fever Active Problems:   Sepsis (HCC)   Asplenia   Malnutrition of moderate degree   Subjective: Complicated course, went to ICU, BCX + Proteus, CT showed obstructing renal stone and had urgent stenting done 3/17. No fevers since 3/17. UC with < 10 K.   ROS  Eleven systems are reviewed and negative except per hpi  Medications:  Antibiotics Given (last 72 hours)    Date/Time Action Medication Dose Rate   05/08/16 1718 Given   vancomycin (VANCOCIN) IVPB 1000 mg/200 mL premix 1,000 mg 200 mL/hr   05/08/16 1853 Given   meropenem (MERREM) IVPB SOLR 1 g 1 g 100 mL/hr   05/08/16 2149 Given   efavirenz-emtricitabine-tenofovir (ATRIPLA) 600-200-300 MG per tablet 1 tablet 1 tablet    05/09/16 0238 Given   vancomycin (VANCOCIN) IVPB 1000 mg/200 mL premix 1,000 mg 200 mL/hr   05/09/16 0421 Given   meropenem (MERREM) IVPB SOLR 1 g 1 g 100 mL/hr   05/09/16 1025 Given   vancomycin (VANCOCIN) IVPB 1000 mg/200 mL premix 1,000 mg 200 mL/hr   05/09/16 1147 Given   meropenem (MERREM) IVPB SOLR 1 g 1 g 100 mL/hr   05/09/16 1453 Given   azithromycin (ZITHROMAX) tablet 500 mg 500 mg    05/10/16 0203 Given   meropenem (MERREM) IVPB SOLR 1 g 1 g 100 mL/hr   05/10/16 1044 Given   meropenem (MERREM) IVPB SOLR 1 g 1 g 100 mL/hr   05/10/16 1044 Given   azithromycin (ZITHROMAX) tablet 250 mg 250 mg    05/10/16 1810 Given   meropenem (MERREM) IVPB SOLR 1 g 1 g 100 mL/hr   05/10/16 2104 Given   efavirenz-emtricitabine-tenofovir (ATRIPLA) 600-200-300 MG per tablet 1 tablet 1 tablet    05/11/16 0322 Given   meropenem (MERREM) IVPB SOLR 1 g 1 g 100 mL/hr   05/11/16 0859 Given   azithromycin (ZITHROMAX) tablet 250 mg 250 mg    05/11/16 1250 Given   meropenem (MERREM) IVPB SOLR 1 g 1 g 100 mL/hr     . aspirin EC  81 mg Oral Daily  . cefTRIAXone (ROCEPHIN)  IV  2 g  Intravenous Q24H  . diltiazem  180 mg Oral Daily  . efavirenz-emtricitabine-tenofovir  1 tablet Oral QHS  . fluticasone  2 spray Each Nare Daily  . glipiZIDE  10 mg Oral Q breakfast  . insulin aspart  0-5 Units Subcutaneous QHS  . insulin aspart  0-9 Units Subcutaneous TID WC  . insulin glargine  14 Units Subcutaneous QHS  . ipratropium-albuterol  3 mL Nebulization Q6H  . pantoprazole  40 mg Oral QAC breakfast  . pravastatin  20 mg Oral Daily  . sodium chloride flush  3 mL Intravenous Q12H    Objective: Vital signs in last 24 hours: Temp:  [98.1 F (36.7 C)-99.2 F (37.3 C)] 99.2 F (37.3 C) (03/19 1245) Pulse Rate:  [80-120] 93 (03/19 1245) Resp:  [17-22] 17 (03/19 1245) BP: (120-146)/(67-87) 146/78 (03/19 1245) SpO2:  [91 %-95 %] 95 % (03/19 1245) Physical Exam  Constitutional: He is oriented to person, place, and time. Obese. He appears well-developed and well-nourished. No distress.  HENT: anicteric Mouth/Throat: Oropharynx is clear and moist. No oropharyngeal exudate.  Cardiovascular: Normal rate, regular rhythm and normal heart sounds. Exam reveals no gallop and no friction rub.  No murmur heard.  Pulmonary/Chest: Effort normal and breath sounds normal. No respiratory distress. He has no wheezes.  Abdominal: Soft. Bowel sounds are normal. He exhibits no distension. There is no tenderness.  Lymphadenopathy:  He has no cervical adenopathy.  Neurological: He is alert and oriented to person, place, and time.  Skin: Skin is warm and dry. No rash noted. No erythema.  Psychiatric: He has a normal mood and affect. His behavior is normal.     Lab Results  Recent Labs  05/09/16 1956 05/10/16 0418  WBC 23.8* 14.2*  HGB 13.5 11.6*  HCT 40.4 34.2*  NA 130* 132*  K 3.5 4.2  CL 96* 98*  CO2 24 25  BUN 17 27*  CREATININE 1.35* 1.62*    Microbiology: Results for orders placed or performed during the hospital encounter of 05/07/16  Urine culture     Status: None    Collection Time: 05/07/16 12:59 PM  Result Value Ref Range Status   Specimen Description URINE, RANDOM  Final   Special Requests NONE  Final   Culture   Final    NO GROWTH Performed at [facility], [address] [place], [place] [address]    Report Status 05/09/2016 FINAL  Final  Blood Culture (routine x 2)     Status: Abnormal   Collection Time: 05/07/16  9:12 PM  Result Value Ref Range Status   Specimen Description BLOOD RIGHT HAND  Final   Special Requests BOTTLES DRAWN AEROBIC AND ANAEROBIC  BCAV  Final   Culture  Setup Time   Final    GRAM NEGATIVE RODS IN BOTH AEROBIC AND ANAEROBIC BOTTLES CRITICAL VALUE NOTED.  VALUE IS CONSISTENT WITH PREVIOUSLY REPORTED AND CALLED VALUE.    Culture (A)  Final    PROTEUS MIRABILIS SUSCEPTIBILITIES PERFORMED ON PREVIOUS CULTURE WITHIN THE LAST 5 DAYS. Performed at [facility], [address] [place], [place] [address]    Report Status 05/11/2016 FINAL  Final  Blood Culture (routine x 2)     Status: Abnormal   Collection Time: 05/07/16  9:12 PM  Result Value Ref Range Status   Specimen Description BLOOD LEFT ANTECUBITAL  Final   Special Requests BOTTLES DRAWN AEROBIC AND ANAEROBIC BCHV  Final   Culture  Setup Time   Final    GRAM NEGATIVE RODS IN BOTH AEROBIC AND ANAEROBIC BOTTLES CRITICAL RESULT CALLED TO, READ BACK BY AND VERIFIED WITH: SHEEMA HALLAJI AT 1833 ON 05/08/2016 JLJ Performed at [facility], [address] [place], [place] [address]    Culture PROTEUS MIRABILIS (A)  Final   Report Status 05/11/2016 FINAL  Final   Organism ID, Bacteria PROTEUS MIRABILIS  Final      Susceptibility   Proteus mirabilis - MIC*    AMPICILLIN <=2 SENSITIVE Sensitive     CEFAZOLIN 8 SENSITIVE Sensitive     CEFEPIME <=1 SENSITIVE Sensitive     CEFTAZIDIME <=1 SENSITIVE Sensitive     CEFTRIAXONE <=1 SENSITIVE Sensitive     CIPROFLOXACIN <=0.25 SENSITIVE Sensitive     GENTAMICIN <=1 SENSITIVE Sensitive     IMIPENEM 2  SENSITIVE Sensitive     TRIMETH/SULFA <=20 SENSITIVE Sensitive     AMPICILLIN/SULBACTAM <=2 SENSITIVE Sensitive     PIP/TAZO <=4 SENSITIVE Sensitive     * PROTEUS MIRABILIS  Blood Culture ID Panel (Reflexed)     Status: Abnormal   Collection Time: 05/07/16  9:12 PM  Result Value Ref Range Status   Enterococcus species NOT DETECTED  NOT DETECTED Final   Listeria monocytogenes NOT DETECTED NOT DETECTED Final   Staphylococcus species NOT DETECTED NOT DETECTED Final   Staphylococcus aureus NOT DETECTED NOT DETECTED Final   Streptococcus species NOT DETECTED NOT DETECTED Final   Streptococcus agalactiae NOT DETECTED NOT DETECTED Final   Streptococcus pneumoniae NOT DETECTED NOT DETECTED Final   Streptococcus pyogenes NOT DETECTED NOT DETECTED Final   Acinetobacter baumannii NOT DETECTED NOT DETECTED Final   Enterobacteriaceae species DETECTED (A) NOT DETECTED Final    Comment: Enterobacteriaceae represent a large family of gram-negative bacteria, not a single organism. CRITICAL RESULT CALLED TO, READ BACK BY AND VERIFIED WITH: SHEEMA HALLAJI AT 1833 ON 05/08/2016 JLJ    Enterobacter cloacae complex NOT DETECTED NOT DETECTED Final   Escherichia coli NOT DETECTED NOT DETECTED Final   Klebsiella oxytoca NOT DETECTED NOT DETECTED Final   Klebsiella pneumoniae NOT DETECTED NOT DETECTED Final   Proteus species DETECTED (A) NOT DETECTED Final    Comment: CRITICAL RESULT CALLED TO, READ BACK BY AND VERIFIED WITH: SHEEMA HALLAJI AT 1833 ON 05/08/2016 JLJ    Serratia marcescens NOT DETECTED NOT DETECTED Final   Carbapenem resistance NOT DETECTED NOT DETECTED Final   Haemophilus influenzae NOT DETECTED NOT DETECTED Final   Neisseria meningitidis NOT DETECTED NOT DETECTED Final   Pseudomonas aeruginosa NOT DETECTED NOT DETECTED Final   Candida albicans NOT DETECTED NOT DETECTED Final   Candida glabrata NOT DETECTED NOT DETECTED Final   Candida krusei NOT DETECTED NOT DETECTED Final   Candida  parapsilosis NOT DETECTED NOT DETECTED Final   Candida tropicalis NOT DETECTED NOT DETECTED Final  Respiratory Panel by PCR     Status: None   Collection Time: 05/08/16  5:25 PM  Result Value Ref Range Status   Adenovirus NOT DETECTED NOT DETECTED Final   Coronavirus 229E NOT DETECTED NOT DETECTED Final   Coronavirus HKU1 NOT DETECTED NOT DETECTED Final   Coronavirus NL63 NOT DETECTED NOT DETECTED Final   Coronavirus OC43 NOT DETECTED NOT DETECTED Final   Metapneumovirus NOT DETECTED NOT DETECTED Final   Rhinovirus / Enterovirus NOT DETECTED NOT DETECTED Final   Influenza A NOT DETECTED NOT DETECTED Final   Influenza B NOT DETECTED NOT DETECTED Final   Parainfluenza Virus 1 NOT DETECTED NOT DETECTED Final   Parainfluenza Virus 2 NOT DETECTED NOT DETECTED Final   Parainfluenza Virus 3 NOT DETECTED NOT DETECTED Final   Parainfluenza Virus 4 NOT DETECTED NOT DETECTED Final   Respiratory Syncytial Virus NOT DETECTED NOT DETECTED Final   Bordetella pertussis NOT DETECTED NOT DETECTED Final   Chlamydophila pneumoniae NOT DETECTED NOT DETECTED Final   Mycoplasma pneumoniae NOT DETECTED NOT DETECTED Final    Comment: Performed at [facility], [address] [place], [place] [address]  MRSA PCR Screening     Status: None   Collection Time: 05/09/16  7:45 AM  Result Value Ref Range Status   MRSA by PCR NEGATIVE NEGATIVE Final    Comment:        The GeneXpert MRSA Assay (FDA approved for NASAL specimens only), is one component of a comprehensive MRSA colonization surveillance program. It is not intended to diagnose MRSA infection nor to guide or monitor treatment for MRSA infections.   Urine culture     Status: Abnormal   Collection Time: 05/09/16  6:48 PM  Result Value Ref Range Status   Specimen Description URINE, CLEAN CATCH  Final   Special Requests NONE  Final   Culture (A)  Final    <10,000 COLONIES/mL INSIGNIFICANT GROWTH Performed at [facility], [address], [place], [place] [address]    Report Status 05/11/2016 FINAL  Final    Studies/Results: Portable Chest Xray  Result Date: 05/09/2016 CLINICAL DATA:  Intubation.  Evaluate ET tube. EXAM: PORTABLE CHEST 1 VIEW COMPARISON:  May 08, 2016 FINDINGS: A new ET tube terminates 2.4 cm above the carina in good position. No pneumothorax. Bilateral pulmonary infiltrates are more prominent since yesterday's chest x-ray. Cardiomegaly. No other changes. IMPRESSION: The ETT is in good position. Increased prominence of bilateral infiltrates, left greater than right. Electronically Signed   By: Gerome Sam III M.D   On: 05/09/2016 20:00    Assessment/Plan: Dave Conrad is a 41 y.o. male with well controlled HIV, DM, Htn, asplenia,hx syphilis (treated) admitted with 4-5 days n/v, mild abd pain as well as fevers to 103, increasing hypoxia.BCX + Proteus and found to have obstructing renal stone, now s/p stenting 3.17 with finding of purulence  Recommendations Proteus bacteremia from Obstructing renal stone- s/p stenting 3.17 Can deescalate to oral keflex for a 14 day course from stenting - stop date 4/1 Will need fu with urology and removal of stone.  HIV - his CD4 count is very low but hiv CD4 % is nml This can happen in acute infections and severe illness and is not indicative of failure of ART Cont atripla. WIll need to monitor renal fxn and dose adjust as necessary. Discussed with patient that I would rec changing off of ATripla due to renal issues and new regimens with TAF or abacavir but he is hesitant and wishes to discuss with his HIV physician at [facility]    Thank you very much for the consult. Will follow with you.  FITZGERALD, DAVID P   05/11/2016, 4:48 PM

## 2016-05-11 NOTE — Progress Notes (Signed)
Patient ID: Dave Conrad, male   DOB: Feb 10, 1976, 41 y.o.   MRN: [id number]    [employer] PROGRESS NOTE  Vidit Boissonneault WUJ:[id number] DOB: 1975-05-12 DOA: 05/07/2016 PCP: [facility]  HPI/Subjective: Patient feeling better today. Again no pain in the back. Breathing comfortably. On room air. He does have some nasal congestion.  Objective: Vitals:   05/11/16 0528 05/11/16 1245  BP: 120/67 (!) 146/78  Pulse: 80 93  Resp: (!) 21 17  Temp: 98.1 F (36.7 C) 99.2 F (37.3 C)    Filed Weights   05/07/16 2226 05/08/16 0057 05/10/16 0323  Weight: 120.2 kg (265 lb) 121.4 kg (267 lb 9.6 oz) 120.3 kg (265 lb 3.4 oz)    ROS: Review of Systems  Constitutional: Negative for chills and fever.  HENT: Positive for congestion.   Eyes: Negative for blurred vision.  Respiratory: Positive for cough. Negative for shortness of breath.   Cardiovascular: Negative for chest pain.  Gastrointestinal: Negative for abdominal pain, constipation, diarrhea, nausea and vomiting.  Genitourinary: Negative for dysuria.  Musculoskeletal: Negative for joint pain.  Neurological: Negative for dizziness and headaches.   Exam: Physical Exam  Constitutional: He is oriented to person, place, and time.  HENT:  Nose: No mucosal edema.  Mouth/Throat: No oropharyngeal exudate or posterior oropharyngeal edema.  Eyes: Conjunctivae, EOM and lids are normal. Pupils are equal, round, and reactive to light.  Neck: No JVD present. Carotid bruit is not present. No edema present. No thyroid mass and no thyromegaly present.  Cardiovascular: S1 normal and S2 normal.  Exam reveals no gallop.   No murmur heard. Pulses:      Dorsalis pedis pulses are 2+ on the right side, and 2+ on the left side.  Respiratory: No respiratory distress. He has decreased breath sounds in the right lower field and the left lower field. He has no wheezes. He has no rhonchi. He has no rales.  GI: Soft. Bowel sounds are normal. There is  no tenderness.  Musculoskeletal:       Right ankle: He exhibits swelling.       Left ankle: He exhibits swelling.  Lymphadenopathy:    He has no cervical adenopathy.  Neurological: He is alert and oriented to person, place, and time. No cranial nerve deficit.  Skin: Skin is warm. No rash noted. Nails show no clubbing.  Psychiatric: He has a normal mood and affect.      Data Reviewed: Basic Metabolic Panel:  Recent Labs Lab 05/07/16 1259 05/08/16 0123 05/08/16 1640 05/09/16 0512 05/09/16 1956 05/10/16 0418  NA 129* 130* 130*  --  130* 132*  K 4.0 4.0 3.8  --  3.5 4.2  CL 95* 93* 97*  --  96* 98*  CO2 24 24 23   --  24 25  GLUCOSE 301* 305* 266*  --  252* 325*  BUN 14 18 17   --  17 27*  CREATININE 1.12 1.48* 1.36* 1.43* 1.35* 1.62*  CALCIUM 8.9 8.5* 8.5*  --  8.4* 8.0*  MG  --   --   --   --  2.2  --   PHOS  --   --   --   --  3.8  --    Liver Function Tests:  Recent Labs Lab 05/07/16 1259 05/08/16 0123 05/08/16 1640  AST 21 46* 45*  ALT 22 30 32  ALKPHOS 81 145* 111  BILITOT 1.3* 2.4* 3.2*  PROT 8.1 7.8 7.3  ALBUMIN 3.9 3.6 3.3*  Recent Labs Lab 05/07/16 1259 05/08/16 1640  LIPASE <10* <10*   CBC:  Recent Labs Lab 05/07/16 1507 05/08/16 0123 05/09/16 1956 05/10/16 0418  WBC 16.4* 12.7*  12.5* 23.8* 14.2*  NEUTROABS 14.9* 12.0*  12.1*  --   --   HGB 13.8 13.0 13.5 11.6*  HCT 40.6 39.2*  39.1 40.4 34.2*  MCV 80.6 80.6  81 80.6 79.8*  PLT 162 171  161 172 165   Cardiac Enzymes:  Recent Labs Lab 05/07/16 1507 05/07/16 2111 05/07/16 2334 05/08/16 0123 05/08/16 0823 05/08/16 1348  CKTOTAL 252  --   --   --   --   --   TROPONINI  --  0.10* 0.09* 0.08* 0.14* 0.07*    CBG:  Recent Labs Lab 05/10/16 1115 05/10/16 1638 05/10/16 2108 05/11/16 0802 05/11/16 1141  GLUCAP 322* 262* 297* 237* 155*    Recent Results (from the past 240 hour(s))  Urine culture     Status: None   Collection Time: 05/07/16 12:59 PM  Result Value Ref  Range Status   Specimen Description URINE, RANDOM  Final   Special Requests NONE  Final   Culture   Final    NO GROWTH Performed at [facility], [address] [place], [place] [address]    Report Status 05/09/2016 FINAL  Final  Blood Culture (routine x 2)     Status: Abnormal   Collection Time: 05/07/16  9:12 PM  Result Value Ref Range Status   Specimen Description BLOOD RIGHT HAND  Final   Special Requests BOTTLES DRAWN AEROBIC AND ANAEROBIC  BCAV  Final   Culture  Setup Time   Final    GRAM NEGATIVE RODS IN BOTH AEROBIC AND ANAEROBIC BOTTLES CRITICAL VALUE NOTED.  VALUE IS CONSISTENT WITH PREVIOUSLY REPORTED AND CALLED VALUE.    Culture (A)  Final    PROTEUS MIRABILIS SUSCEPTIBILITIES PERFORMED ON PREVIOUS CULTURE WITHIN THE LAST 5 DAYS. Performed at [facility], [address] [place], [place] [address]    Report Status 05/11/2016 FINAL  Final  Blood Culture (routine x 2)     Status: Abnormal   Collection Time: 05/07/16  9:12 PM  Result Value Ref Range Status   Specimen Description BLOOD LEFT ANTECUBITAL  Final   Special Requests BOTTLES DRAWN AEROBIC AND ANAEROBIC BCHV  Final   Culture  Setup Time   Final    GRAM NEGATIVE RODS IN BOTH AEROBIC AND ANAEROBIC BOTTLES CRITICAL RESULT CALLED TO, READ BACK BY AND VERIFIED WITH: SHEEMA HALLAJI AT 1833 ON 05/08/2016 JLJ Performed at [facility], [address] [place], [place] [address]    Culture PROTEUS MIRABILIS (A)  Final   Report Status 05/11/2016 FINAL  Final   Organism ID, Bacteria PROTEUS MIRABILIS  Final      Susceptibility   Proteus mirabilis - MIC*    AMPICILLIN <=2 SENSITIVE Sensitive     CEFAZOLIN 8 SENSITIVE Sensitive     CEFEPIME <=1 SENSITIVE Sensitive     CEFTAZIDIME <=1 SENSITIVE Sensitive     CEFTRIAXONE <=1 SENSITIVE Sensitive     CIPROFLOXACIN <=0.25 SENSITIVE Sensitive     GENTAMICIN <=1 SENSITIVE Sensitive     IMIPENEM 2 SENSITIVE Sensitive     TRIMETH/SULFA <=20 SENSITIVE  Sensitive     AMPICILLIN/SULBACTAM <=2 SENSITIVE Sensitive     PIP/TAZO <=4 SENSITIVE Sensitive     * PROTEUS MIRABILIS  Blood Culture ID Panel (Reflexed)     Status: Abnormal  Collection Time: 05/07/16  9:12 PM  Result Value Ref Range Status   Enterococcus species NOT DETECTED NOT DETECTED Final   Listeria monocytogenes NOT DETECTED NOT DETECTED Final   Staphylococcus species NOT DETECTED NOT DETECTED Final   Staphylococcus aureus NOT DETECTED NOT DETECTED Final   Streptococcus species NOT DETECTED NOT DETECTED Final   Streptococcus agalactiae NOT DETECTED NOT DETECTED Final   Streptococcus pneumoniae NOT DETECTED NOT DETECTED Final   Streptococcus pyogenes NOT DETECTED NOT DETECTED Final   Acinetobacter baumannii NOT DETECTED NOT DETECTED Final   Enterobacteriaceae species DETECTED (A) NOT DETECTED Final    Comment: Enterobacteriaceae represent a large family of gram-negative bacteria, not a single organism. CRITICAL RESULT CALLED TO, READ BACK BY AND VERIFIED WITH: SHEEMA HALLAJI AT 1833 ON 05/08/2016 JLJ    Enterobacter cloacae complex NOT DETECTED NOT DETECTED Final   Escherichia coli NOT DETECTED NOT DETECTED Final   Klebsiella oxytoca NOT DETECTED NOT DETECTED Final   Klebsiella pneumoniae NOT DETECTED NOT DETECTED Final   Proteus species DETECTED (A) NOT DETECTED Final    Comment: CRITICAL RESULT CALLED TO, READ BACK BY AND VERIFIED WITH: SHEEMA HALLAJI AT 1833 ON 05/08/2016 JLJ    Serratia marcescens NOT DETECTED NOT DETECTED Final   Carbapenem resistance NOT DETECTED NOT DETECTED Final   Haemophilus influenzae NOT DETECTED NOT DETECTED Final   Neisseria meningitidis NOT DETECTED NOT DETECTED Final   Pseudomonas aeruginosa NOT DETECTED NOT DETECTED Final   Candida albicans NOT DETECTED NOT DETECTED Final   Candida glabrata NOT DETECTED NOT DETECTED Final   Candida krusei NOT DETECTED NOT DETECTED Final   Candida parapsilosis NOT DETECTED NOT DETECTED Final   Candida  tropicalis NOT DETECTED NOT DETECTED Final  Respiratory Panel by PCR     Status: None   Collection Time: 05/08/16  5:25 PM  Result Value Ref Range Status   Adenovirus NOT DETECTED NOT DETECTED Final   Coronavirus 229E NOT DETECTED NOT DETECTED Final   Coronavirus HKU1 NOT DETECTED NOT DETECTED Final   Coronavirus NL63 NOT DETECTED NOT DETECTED Final   Coronavirus OC43 NOT DETECTED NOT DETECTED Final   Metapneumovirus NOT DETECTED NOT DETECTED Final   Rhinovirus / Enterovirus NOT DETECTED NOT DETECTED Final   Influenza A NOT DETECTED NOT DETECTED Final   Influenza B NOT DETECTED NOT DETECTED Final   Parainfluenza Virus 1 NOT DETECTED NOT DETECTED Final   Parainfluenza Virus 2 NOT DETECTED NOT DETECTED Final   Parainfluenza Virus 3 NOT DETECTED NOT DETECTED Final   Parainfluenza Virus 4 NOT DETECTED NOT DETECTED Final   Respiratory Syncytial Virus NOT DETECTED NOT DETECTED Final   Bordetella pertussis NOT DETECTED NOT DETECTED Final   Chlamydophila pneumoniae NOT DETECTED NOT DETECTED Final   Mycoplasma pneumoniae NOT DETECTED NOT DETECTED Final    Comment: Performed at [facility], [address] [place], [place] [address]  MRSA PCR Screening     Status: None   Collection Time: 05/09/16  7:45 AM  Result Value Ref Range Status   MRSA by PCR NEGATIVE NEGATIVE Final    Comment:        The GeneXpert MRSA Assay (FDA approved for NASAL specimens only), is one component of a comprehensive MRSA colonization surveillance program. It is not intended to diagnose MRSA infection nor to guide or monitor treatment for MRSA infections.   Urine culture     Status: Abnormal   Collection Time: 05/09/16  6:48 PM  Result Value Ref Range Status  Specimen Description URINE, CLEAN CATCH  Final   Special Requests NONE  Final   Culture (A)  Final    <10,000 COLONIES/mL INSIGNIFICANT GROWTH Performed at [facility], [address], [place], [place] [address]    Report Status  05/11/2016 FINAL  Final     Studies: Portable Chest Xray  Result Date: 05/09/2016 CLINICAL DATA:  Intubation.  Evaluate ET tube. EXAM: PORTABLE CHEST 1 VIEW COMPARISON:  May 08, 2016 FINDINGS: A new ET tube terminates 2.4 cm above the carina in good position. No pneumothorax. Bilateral pulmonary infiltrates are more prominent since yesterday's chest x-ray. Cardiomegaly. No other changes. IMPRESSION: The ETT is in good position. Increased prominence of bilateral infiltrates, left greater than right. Electronically Signed   By: Gerome Sam III M.D   On: 05/09/2016 20:00    Scheduled Meds: . aspirin EC  81 mg Oral Daily  . cefTRIAXone (ROCEPHIN)  IV  2 g Intravenous Q24H  . diltiazem  180 mg Oral Daily  . efavirenz-emtricitabine-tenofovir  1 tablet Oral QHS  . fluticasone  2 spray Each Nare Daily  . glipiZIDE  10 mg Oral Q breakfast  . insulin aspart  0-5 Units Subcutaneous QHS  . insulin aspart  0-9 Units Subcutaneous TID WC  . insulin glargine  14 Units Subcutaneous QHS  . ipratropium-albuterol  3 mL Nebulization Q6H  . pantoprazole  40 mg Oral QAC breakfast  . pravastatin  20 mg Oral Daily  . sodium chloride flush  3 mL Intravenous Q12H    Assessment/Plan:  1. Clinical sepsis, Pneumonia seen on CT scan and not on 2 x-rays. Pro-calcitonin very high. Blood cultures positive for Proteus.  Switch antibiotics over to Rocephin 2 g IV daily and potentially over to oral antibiotics tomorrow upon discharge 2. Acute respiratory failure with hypoxia. Patient extubated the morning after procedure. Now breathing comfortably on room air 3. Obstructing kidney stone. Requiring urgent stenting procedure. As per urology, Foley catheter can come out tomorrow if creatinine improves. 4. 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 CD4 count drop secondary to acute infection. 5. Acute kidney injury. Should improve after ureter stent. Continue to hold lisinopril for now. 6. Hyperlipidemia unspecified  on pravastatin 7. Essential hypertension on diltiazem  8. Type 2 diabetes mellitus with hyperglycemia. Restarted glipizide. Put on sliding scale. Increase Lantus to 14 units nightly 9. Elevated bilirubin. Right upper quadrant sonogram negative 10. Elevated troponin demand ischemia from sepsis  Code Status:     Code Status Orders        Start     Ordered   05/08/16 0043  Full code  Continuous     05/08/16 0043    Code Status History    Date Active Date Inactive Code Status Order ID Comments User Context   This patient has a current code status but no historical code status.     Disposition Plan: Potentially home tomorrow  Consultants:  Infectious disease  Antibiotics:  Rocephin  Time spent: 24 minutes  Alford Highland  [employer]

## 2016-05-11 NOTE — Progress Notes (Signed)
Urology Consult Follow Up  Subjective: Transferred to floor in hemodynamically stable. Foley remains in place. Tolerating diet. Watching cartoons this morning.  Anti-infectives: Anti-infectives    Start     Dose/Rate Route Frequency Ordered Stop   05/10/16 1000  azithromycin (ZITHROMAX) tablet 250 mg     250 mg Oral Daily 05/09/16 1418 05/14/16 0959   05/09/16 1430  azithromycin (ZITHROMAX) tablet 500 mg     500 mg Oral Daily 05/09/16 1418 05/09/16 1453   05/08/16 1100  meropenem (MERREM) IVPB SOLR 1 g     1 g 100 mL/hr over 30 Minutes Intravenous Every 8 hours 05/08/16 0859     05/08/16 1000  vancomycin (VANCOCIN) IVPB 1000 mg/200 mL premix  Status:  Discontinued     1,000 mg 200 mL/hr over 60 Minutes Intravenous Every 8 hours 05/08/16 0129 05/09/16 1419   05/08/16 0230  meropenem (MERREM) 1 g in sodium chloride 0.9 % 100 mL IVPB  Status:  Discontinued     1 g 200 mL/hr over 30 Minutes Intravenous Every 8 hours 05/08/16 0123 05/08/16 0859   05/08/16 0130  efavirenz-emtricitabine-tenofovir (ATRIPLA) 600-200-300 MG per tablet 1 tablet     1 tablet Oral Daily at bedtime 05/08/16 0043     05/08/16 0115  levofloxacin (LEVAQUIN) IVPB 750 mg  Status:  Discontinued     750 mg 100 mL/hr over 90 Minutes Intravenous  Once 05/08/16 0043 05/08/16 0123   05/08/16 0045  aztreonam (AZACTAM) 2 g in dextrose 5 % 50 mL IVPB  Status:  Discontinued     2 g 100 mL/hr over 30 Minutes Intravenous  Once 05/08/16 0043 05/08/16 0123   05/08/16 0045  vancomycin (VANCOCIN) IVPB 1000 mg/200 mL premix     1,000 mg 200 mL/hr over 60 Minutes Intravenous  Once 05/08/16 0043 05/08/16 0349   05/07/16 2030  levofloxacin (LEVAQUIN) IVPB 750 mg     750 mg 100 mL/hr over 90 Minutes Intravenous  Once 05/07/16 2024 05/07/16 2257   05/07/16 2030  aztreonam (AZACTAM) 2 g in dextrose 5 % 50 mL IVPB     2 g 100 mL/hr over 30 Minutes Intravenous  Once 05/07/16 2024 05/07/16 2300   05/07/16 2030  vancomycin (VANCOCIN) IVPB  1000 mg/200 mL premix     1,000 mg 200 mL/hr over 60 Minutes Intravenous  Once 05/07/16 2024 05/07/16 2220      Current Facility-Administered Medications  Medication Dose Route Frequency Provider Last Rate Last Dose  . acetaminophen (TYLENOL) tablet 650 mg  650 mg Oral Q6H PRN Alexis Hugelmeyer, DO   650 mg at 05/09/16 1453   Or  . acetaminophen (TYLENOL) suppository 650 mg  650 mg Rectal Q6H PRN Alexis Hugelmeyer, DO      . aspirin EC tablet 81 mg  81 mg Oral Daily Alexis Hugelmeyer, DO   81 mg at 05/11/16 0900  . azithromycin (ZITHROMAX) tablet 250 mg  250 mg Oral Daily Alford Highland, MD   250 mg at 05/11/16 0859  . bisacodyl (DULCOLAX) EC tablet 5 mg  5 mg Oral Daily PRN Alexis Hugelmeyer, DO      . bisacodyl (DULCOLAX) suppository 10 mg  10 mg Rectal Daily PRN Lewie Loron, NP      . diltiazem (DILACOR XR) 24 hr capsule 180 mg  180 mg Oral Daily Alexis Hugelmeyer, DO   180 mg at 05/11/16 0900  . efavirenz-emtricitabine-tenofovir (ATRIPLA) 600-200-300 MG per tablet 1 tablet  1 tablet Oral QHS Alexis Hugelmeyer, DO  1 tablet at 05/10/16 2104  . fluticasone (FLONASE) 50 MCG/ACT nasal spray 2 spray  2 spray Each Nare Daily Alford Highland, MD      . glipiZIDE (GLUCOTROL XL) 24 hr tablet 10 mg  10 mg Oral Q breakfast Alford Highland, MD   10 mg at 05/11/16 0859  . insulin aspart (novoLOG) injection 0-5 Units  0-5 Units Subcutaneous QHS Alford Highland, MD   3 Units at 05/10/16 2125  . insulin aspart (novoLOG) injection 0-9 Units  0-9 Units Subcutaneous TID WC Alford Highland, MD   3 Units at 05/11/16 0859  . insulin glargine (LANTUS) injection 14 Units  14 Units Subcutaneous QHS Alford Highland, MD      . ipratropium-albuterol (DUONEB) 0.5-2.5 (3) MG/3ML nebulizer solution 3 mL  3 mL Nebulization Q6H Alford Highland, MD   3 mL at 05/11/16 0229  . magnesium citrate solution 1 Bottle  1 Bottle Oral Once PRN Alexis Hugelmeyer, DO      . meropenem (MERREM) IVPB SOLR 1 g  1 g Intravenous  Q8H Alexis Hugelmeyer, DO   1 g at 05/11/16 0322  . ondansetron (ZOFRAN) tablet 4 mg  4 mg Oral Q6H PRN Alexis Hugelmeyer, DO       Or  . ondansetron (ZOFRAN) injection 4 mg  4 mg Intravenous Q6H PRN Alexis Hugelmeyer, DO   4 mg at 05/10/16 1649  . oxyCODONE (Oxy IR/ROXICODONE) immediate release tablet 5 mg  5 mg Oral Q4H PRN Alexis Hugelmeyer, DO   5 mg at 05/08/16 0418  . pantoprazole (PROTONIX) EC tablet 40 mg  40 mg Oral QAC breakfast John Conforti, DO   40 mg at 05/11/16 0859  . pravastatin (PRAVACHOL) tablet 20 mg  20 mg Oral Daily Alexis Hugelmeyer, DO   20 mg at 05/11/16 0859  . senna-docusate (Senokot-S) tablet 1 tablet  1 tablet Oral QHS PRN Alexis Hugelmeyer, DO      . sennosides (SENOKOT) 8.8 MG/5ML syrup 5 mL  5 mL Per Tube BID PRN Lewie Loron, NP      . sodium chloride flush (NS) 0.9 % injection 3 mL  3 mL Intravenous Q12H Alexis Hugelmeyer, DO   3 mL at 05/10/16 2118  . zolpidem (AMBIEN) tablet 5 mg  5 mg Oral QHS PRN,MR X 1 Alexis Hugelmeyer, DO         Objective: Vital signs in last 24 hours: Temp:  [98.1 F (36.7 C)-98.5 F (36.9 C)] 98.1 F (36.7 C) (03/19 0528) Pulse Rate:  [80-120] 80 (03/19 0528) Resp:  [21-27] 21 (03/19 0528) BP: (120-142)/(67-87) 120/67 (03/19 0528) SpO2:  [91 %-98 %] 92 % (03/19 0700)  Intake/Output from previous day: 03/18 0701 - 03/19 0700 In: 1110.9 [P.O.:900; I.V.:60.9; IV Piggyback:150] Out: 1450 [Urine:1450] Intake/Output this shift: Total I/O In: -  Out: 450 [Urine:450]   Physical Exam  Constitutional: He is oriented to person, place, and time and well-developed, well-nourished, and in no distress.  Cardiovascular: Normal rate and regular rhythm.   Abdominal: Soft.  Genitourinary: Penis normal.  Genitourinary Comments: Foley placed draining clear yellow urine  Neurological: He is alert and oriented to person, place, and time.  Skin: Skin is warm and dry.  Vitals reviewed.   Lab Results:   Recent Labs   05/09/16 1956 05/10/16 0418  WBC 23.8* 14.2*  HGB 13.5 11.6*  HCT 40.4 34.2*  PLT 172 165   BMET  Recent Labs  05/09/16 1956 05/10/16 0418  NA 130* 132*  K 3.5 4.2  CL 96* 98*  CO2 24 25  GLUCOSE 252* 325*  BUN 17 27*  CREATININE 1.35* 1.62*  CALCIUM 8.4* 8.0*   PT/INR  Recent Labs  05/09/16 1956  LABPROT 15.4*  INR 1.21   ABG  Recent Labs  05/09/16 0608 05/09/16 1930  PHART 7.44 7.31*  HCO3 20.4 25.2    Studies/Results: Ct Abdomen Pelvis Wo Contrast  Result Date: 05/09/2016 CLINICAL DATA:  41 year old male with dyspnea, nausea, vomiting and diarrhea for the past several days EXAM: CT CHEST, ABDOMEN AND PELVIS WITHOUT CONTRAST TECHNIQUE: Multidetector CT imaging of the chest, abdomen and pelvis was performed following the standard protocol without IV contrast. COMPARISON:  None. FINDINGS: CT CHEST FINDINGS Cardiovascular: Limited evaluation in the absence of intravenous contrast. The heart is normal in size. No aortic aneurysm. Small volume pericardial fluid. Mediastinum/Nodes: Unremarkable CT appearance of the thyroid gland. No suspicious mediastinal or hilar adenopathy. No soft tissue mediastinal mass. The thoracic esophagus is unremarkable. Lungs/Pleura: Bilateral ground-glass attenuation airspace opacity and consolidation throughout the lungs bilaterally in a peribronchovascular distribution. Findings are most consistent with a multifocal infectious/ inflammatory process. No interlobular septal thickening, pleural effusion, pneumothorax or nodularity. Musculoskeletal: No acute fracture or aggressive appearing lytic or blastic osseous lesion. CT ABDOMEN PELVIS FINDINGS Hepatobiliary: No focal liver abnormality is seen. No gallstones, gallbladder wall thickening, or biliary dilatation. Pancreas: Unremarkable. No pancreatic ductal dilatation or surrounding inflammatory changes. Spleen: Normal in size without focal abnormality. Adrenals/Urinary Tract: Right-sided staghorn  calculus primarily within the lower pole collecting system. There is moderate hydronephrosis in the upper and interpolar regions. At least partially obstructing 7 x 12 mm calculus in the right mid to upper ureter. There is associated is right renal edema and mild perinephric stranding. No stones present in the left kidney. The adrenal glands are normal. Unremarkable bladder. Stomach/Bowel: No evidence of obstruction or focal bowel wall thickening. Normal appendix in the right lower quadrant. The terminal ileum is unremarkable. Vascular/Lymphatic: No significant vascular findings are present. No enlarged abdominal or pelvic lymph nodes. Reproductive: Prostate is unremarkable. Other: Small fat containing umbilical hernia. Musculoskeletal: No acute fracture or aggressive appearing lytic or blastic osseous lesion. IMPRESSION: CT CHEST 1. Consolidative changes and ground-glass attenuation airspace opacities in a peribronchovascular distribution throughout all lobes of both lungs. The imaging appearance is most consistent with a multi lobar infectious/inflammatory process such as multi lobar pneumonia, an atypical or viral respiratory illness, or acute pneumonitis. 2. Small pericardial effusion. CT ABD/PELVIS 1. Obstructing 7 x 12 mm stone in the right proximal to mid ureter with resultant moderate right-sided hydronephrosis, renal edema and perinephric stranding. 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 2. Small fat containing umbilical hernia. Electronically Signed   By: Malachy Moan M.D.   On: 05/09/2016 13:42   Ct Chest Wo Contrast  Result Date: 05/09/2016 CLINICAL DATA:  41 year old male with dyspnea, nausea, vomiting and diarrhea for the past several days EXAM: CT CHEST, ABDOMEN AND PELVIS WITHOUT CONTRAST TECHNIQUE: Multidetector CT imaging of the chest, abdomen and pelvis was performed following the standard protocol without IV  contrast. COMPARISON:  None. FINDINGS: CT CHEST FINDINGS Cardiovascular: Limited evaluation in the absence of intravenous contrast. The heart is normal in size. No aortic aneurysm. Small volume pericardial fluid. Mediastinum/Nodes: Unremarkable CT appearance of the thyroid gland. No suspicious mediastinal or hilar adenopathy. No soft tissue mediastinal mass. The thoracic esophagus is unremarkable. Lungs/Pleura: Bilateral ground-glass attenuation airspace opacity and  consolidation throughout the lungs bilaterally in a peribronchovascular distribution. Findings are most consistent with a multifocal infectious/ inflammatory process. No interlobular septal thickening, pleural effusion, pneumothorax or nodularity. Musculoskeletal: No acute fracture or aggressive appearing lytic or blastic osseous lesion. CT ABDOMEN PELVIS FINDINGS Hepatobiliary: No focal liver abnormality is seen. No gallstones, gallbladder wall thickening, or biliary dilatation. Pancreas: Unremarkable. No pancreatic ductal dilatation or surrounding inflammatory changes. Spleen: Normal in size without focal abnormality. 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 There is moderate hydronephrosis in the upper and interpolar regions. At least partially obstructing 7 x 12 mm calculus in the right mid to upper ureter. There is associated is right renal edema and mild perinephric stranding. No stones present in the left kidney. The adrenal glands are normal. Unremarkable bladder. Stomach/Bowel: No evidence of obstruction or focal bowel wall thickening. Normal appendix in the right lower quadrant. The terminal ileum is unremarkable. Vascular/Lymphatic: No significant vascular findings are present. No enlarged abdominal or pelvic lymph nodes. Reproductive: Prostate is unremarkable. Other: Small fat containing umbilical hernia. Musculoskeletal: No acute fracture or aggressive appearing lytic or blastic  osseous lesion. IMPRESSION: CT CHEST 1. Consolidative changes and ground-glass attenuation airspace opacities in a peribronchovascular distribution throughout all lobes of both lungs. The imaging appearance is most consistent with a multi lobar infectious/inflammatory process such as multi lobar pneumonia, an atypical or viral respiratory illness, or acute pneumonitis. 2. Small pericardial effusion. CT ABD/PELVIS 1. Obstructing 7 x 12 mm stone in the right proximal to mid ureter with resultant moderate right-sided hydronephrosis, renal edema and perinephric stranding. 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 2. Small fat containing umbilical hernia. Electronically Signed   By: Malachy Moan M.D.   On: 05/09/2016 13:42   US Venous Img Lower Bilateral  Result Date: 05/09/2016 CLINICAL DATA:  Bilateral lower extremity edema. Hypoxemia with respiratory failure. Evaluate for DVT. EXAM: BILATERAL LOWER EXTREMITY VENOUS DOPPLER ULTRASOUND TECHNIQUE: Gray-scale sonography with graded compression, as well as color Doppler and duplex ultrasound were performed to evaluate the lower extremity deep venous systems from the level of the common femoral vein and including the common femoral, femoral, profunda femoral, popliteal and calf veins including the posterior tibial, peroneal and gastrocnemius veins when visible. The superficial great saphenous vein was also interrogated. Spectral Doppler was utilized to evaluate flow at rest and with distal augmentation maneuvers in the common femoral, femoral and popliteal veins. COMPARISON:  None. FINDINGS: RIGHT LOWER EXTREMITY Common Femoral Vein: No evidence of thrombus. Normal compressibility, respiratory phasicity and response to augmentation. Saphenofemoral Junction: No evidence of thrombus. Normal compressibility and flow on color Doppler imaging. Profunda Femoral Vein: No evidence of thrombus. Normal  compressibility and flow on color Doppler imaging. Femoral Vein: No evidence of thrombus. Normal compressibility, respiratory phasicity and response to augmentation. Popliteal Vein: No evidence of thrombus. Normal compressibility, respiratory phasicity and response to augmentation. Calf Veins: No evidence of thrombus. Normal compressibility and flow on color Doppler imaging. Superficial Great Saphenous Vein: No evidence of thrombus. Normal compressibility and flow on color Doppler imaging. Venous Reflux:  None. Other Findings:  None. LEFT LOWER EXTREMITY Common Femoral Vein: No evidence of thrombus. Normal compressibility, respiratory phasicity and response to augmentation. Saphenofemoral Junction: No evidence of thrombus. Normal compressibility and flow on color Doppler imaging. Profunda Femoral Vein: No evidence of thrombus. Normal compressibility and flow on color Doppler imaging. Femoral Vein: No evidence of thrombus.  Normal compressibility, respiratory phasicity and response to augmentation. Popliteal Vein: No evidence of thrombus. Normal compressibility, respiratory phasicity and response to augmentation. Calf Veins: No evidence of thrombus. Normal compressibility and flow on color Doppler imaging. Superficial Great Saphenous Vein: No evidence of thrombus. Normal compressibility and flow on color Doppler imaging. Venous Reflux:  None. Other Findings:  None. IMPRESSION: No evidence of DVT within either lower extremity. Electronically Signed   By: Simonne ComeJohn  Watts M.D.   On: 05/09/2016 15:22   Portable Chest Xray  Result Date: 05/09/2016 CLINICAL DATA:  Intubation.  Evaluate ET tube. EXAM: PORTABLE CHEST 1 VIEW COMPARISON:  May 08, 2016 FINDINGS: A new ET tube terminates 2.4 cm above the carina in good position. No pneumothorax. Bilateral pulmonary infiltrates are more prominent since yesterday's chest x-ray. Cardiomegaly. No other changes. IMPRESSION: The ETT is in good position. Increased prominence of  bilateral infiltrates, left greater than right. Electronically Signed   By: Gerome Samavid  Williams III M.D   On: 05/09/2016 20:00     Assessment: POD 2s/p Procedure(s): CYSTOSCOPY WITH STENT PLACEMENT and retrorade pyelogram  Plan: Doing well now transferred to floor AM labs still pending- OK to d/c Foley in Cr improving UCx negative, BCx growing for proteus Discussion today with the patient about plan for kidney stones, will need PCNL Follow up as outpt arranged  Urology will sign off, please page with questions or concerns  LOS: 4 days    Vanna Scotlandshley Caidence Kaseman 05/11/2016

## 2016-05-11 NOTE — Progress Notes (Signed)
Inpatient Diabetes Program Recommendations  AACE/ADA: New Consensus Statement on Inpatient Glycemic Control (2015)  Target Ranges:  Prepandial:   less than 140 mg/dL      Peak postprandial:   less than 180 mg/dL (1-2 hours)      Critically ill patients:  140 - 180 mg/dL   Results for Dave Conrad, Dave Conrad (MRN [id number]) as of 05/11/2016 11:17  Ref. Range 05/10/2016 07:07 05/10/2016 11:15 05/10/2016 16:38 05/10/2016 21:08 05/11/2016 08:02  Glucose-Capillary Latest Ref Range: 65 - 99 mg/dL 409319 (H) 811322 (H) 914262 (H) 297 (H) 237 (H)   Review of Glycemic Control  Diabetes history: DM 2, A1c 7.5% on 05/08/16  Outpatient Diabetes medications: Glipizide 10 mg qam, 5 mg qpm, Metformin 1000 mg bid Current orders for Inpatient glycemic control: Lantus 10 units, Novolog Sensitive (0-9 units) scale + Novolog HS scale (0-5 units), Glipizide 10 mg qam  Inpatient Diabetes Program Recommendations:   Glucose elevated in the 200's. Due to patient's weight and glucose trends, consider increasing Lantus to 14 units.  Thanks,  Christena DeemShannon Lanasia Porras RN, MSN, [facility] Inpatient Diabetes Coordinator Team Pager [phone] (8a-5p)

## 2016-05-12 ENCOUNTER — Telehealth: Payer: Self-pay | Admitting: Urology

## 2016-05-12 LAB — BASIC METABOLIC PANEL
Anion gap: 7 (ref 5–15)
BUN: 22 mg/dL — AB (ref 6–20)
CALCIUM: 8.3 mg/dL — AB (ref 8.9–10.3)
CHLORIDE: 100 mmol/L — AB (ref 101–111)
CO2: 26 mmol/L (ref 22–32)
CREATININE: 1.07 mg/dL (ref 0.61–1.24)
GFR calc non Af Amer: >60 mL/min (ref >60)
Glucose, Bld: 151 mg/dL — ABNORMAL HIGH (ref 65–99)
Potassium: 3.3 mmol/L — ABNORMAL LOW (ref 3.5–5.1)
SODIUM: 133 mmol/L — AB (ref 135–145)

## 2016-05-12 LAB — BLOOD GAS, ARTERIAL
Acid-base deficit: 2.6 mmol/L — ABNORMAL HIGH (ref 0.0–2.0)
Bicarbonate: 20.4 mmol/L (ref 20.0–28.0)
FIO2: 1
O2 Saturation: 87.2 %
Patient temperature: 37
pCO2 arterial: 30 mmHg — ABNORMAL LOW (ref 32.0–48.0)
pH, Arterial: 7.44 (ref 7.350–7.450)
pO2, Arterial: 51 mmHg — ABNORMAL LOW (ref 83.0–108.0)

## 2016-05-12 LAB — GLUCOSE, CAPILLARY
GLUCOSE-CAPILLARY: 208 mg/dL — AB (ref 65–99)
Glucose-Capillary: 169 mg/dL — ABNORMAL HIGH (ref 65–99)

## 2016-05-12 LAB — CBC
HCT: 34.8 % — ABNORMAL LOW (ref 40.0–52.0)
Hemoglobin: 11.9 g/dL — ABNORMAL LOW (ref 13.0–18.0)
MCH: 27.1 pg (ref 26.0–34.0)
MCHC: 34.2 g/dL (ref 32.0–36.0)
MCV: 79.2 fL — AB (ref 80.0–100.0)
PLATELETS: 240 10*3/uL (ref 150–440)
RBC: 4.39 MIL/uL — AB (ref 4.40–5.90)
RDW: 15.3 % — AB (ref 11.5–14.5)
WBC: 18.9 10*3/uL — AB (ref 3.8–10.6)

## 2016-05-12 MED ORDER — POTASSIUM CHLORIDE CRYS ER 20 MEQ PO TBCR
40.0000 meq | EXTENDED_RELEASE_TABLET | Freq: Once | ORAL | Status: AC
  Administered 2016-05-12: 40 meq via ORAL
  Filled 2016-05-12: qty 2

## 2016-05-12 MED ORDER — CEPHALEXIN 500 MG PO CAPS: 500.0000 mg | ORAL_CAPSULE | Freq: Three times a day (TID) | ORAL | Status: DC

## 2016-05-12 MED ORDER — CEPHALEXIN 500 MG PO CAPS: 500.0000 mg | ORAL_CAPSULE | Freq: Three times a day (TID) | ORAL | 0 refills | Status: DC

## 2016-05-12 NOTE — Telephone Encounter (Signed)
done

## 2016-05-12 NOTE — Telephone Encounter (Signed)
-----   Message from Benjamin W Herrick, MD sent at 05/10/2016  8:48 AM EDT ----- °Regarding: f/u °This patient needs to be scheduled for f/u in 2 weeks with Dr. Brandon to discuss PCNL.  Currently in the hospital in the ICU in critical condition. ° °Thanks,b ° °

## 2016-05-12 NOTE — Discharge Summary (Signed)
[employer] - [place] at [facility]   PATIENT NAME: Dave Conrad    MR#:  [id number]  DATE OF BIRTH:  1976/01/26  DATE OF ADMISSION:  05/07/2016 ADMITTING PHYSICIAN: Tonye Royalty, DO  DATE OF DISCHARGE: 05/12/2016  PRIMARY CARE PHYSICIAN: [facility]    ADMISSION DIAGNOSIS:  Sepsis, due to unspecified organism (HCC) [A41.9] Fever, unspecified fever cause [R50.9] Intractable vomiting, presence of nausea not specified, unspecified vomiting type [R11.10] Sepsis (HCC) [A41.9]  DISCHARGE DIAGNOSIS:  Active Problems:   Sepsis (HCC)   Asplenia   Malnutrition of moderate degree   SECONDARY DIAGNOSIS:   Past Medical History:  Diagnosis Date  . Asplenia 05/08/2016  . Diabetes mellitus without complication (HCC)   . HIV (human immunodeficiency virus infection) (HCC)   . Hypertension     HOSPITAL COURSE:   1. Proteus sepsis. Likely secondary to obstructing kidney stone. Patient improved quickly after stent placement. The patient initially was on meropenem and vancomycin. Meropenem until Sensitivities Were Back. Switched over to Rocephin. And upon Discharge Will Be on Keflex for Total 14 Days after Stent Placed. On CT Scan of the Chest,  Pneumonia was seen but that could be secondary to fluid overload secondary to IV fluids. 2. Acute respiratory failure with hypoxia. The patient had increasing oxygen requirements during the hospital course. He needed to be intubated for the urological procedure. He was kept on the ventilator overnight and extubated the next morning. He was tapered quickly down to 3 L of oxygen and then off oxygen. Upon discharge patient breathing comfortably on room air. 3. Obstructing kidney stone. The patient never complained of any back pain. The patient Required urgent stent procedure once diagnosis was made. CT scan was done on 05/09/2016 and stent procedure done by Dr. Marlou Porch that evening. Foley catheter removed prior to discharge on  05/12/2016. Patient will need follow-up with Dr. Apolinar Junes as outpatient for obstructing stone, stent removal and staghorn calculus of the right kidney. 4. Acute kidney injury secondary to obstructing stone. This has resolved. 5. 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 Infectious disease specialist Dr. Sampson Goon thinks the CD4 drop was secondary to acute infection. Continue usual medication at this point. 6. Hyperlipidemia unspecified on pravastatin 7. Essential hypertension on diltiazem. Can consider restarting lisinopril as outpatient 8. Type 2 diabetes mellitus with hyperglycemia. Restart glipizide. Can restart Glucophage as outpatient. In the hospital we did do Lantus 14 units nightly and the patient was hesitant about going home on this. 9. Elevated bilirubin. Right upper quadrant sonogram negative 10. Elevated troponin demand ischemia from sepsis  DISCHARGE CONDITIONS:   Satisfactory  CONSULTS OBTAINED:  Treatment Team:  Mick Sell, MD Tora Kindred, DO Crist Fat, MD  DRUG ALLERGIES:   Allergies  Allergen Reactions  . Penicillins Nausea And Vomiting    Has patient had a PCN reaction causing immediate rash, facial/tongue/throat swelling, SOB or lightheadedness with hypotension: yes rash Has patient had a PCN reaction causing severe rash involving mucus membranes or skin necrosis:no Has patient had a PCN reaction that required hospitalization no Has patient had a PCN reaction occurring within the last 10 years: yes If all of the above answers are "NO", then may proceed with Cephalosporin use.     DISCHARGE MEDICATIONS:   Current Discharge Medication List    START taking these medications   Details  cephALEXin (KEFLEX) 500 MG capsule Take 1 capsule (500 mg total) by mouth every 8 (eight) hours. Qty: 35 capsule, Refills: 0  CONTINUE these medications which have NOT CHANGED   Details  diltiazem (DILACOR XR) 180 MG 24 hr capsule Take 180 mg by mouth  daily.    efavirenz-emtricitabine-tenofovir (ATRIPLA) 600-200-300 MG tablet Take 1 tablet by mouth at bedtime.    glipiZIDE (GLUCOTROL XL) 10 MG 24 hr tablet Take 5-10 mg by mouth 2 (two) times daily. Take 10 mg in the morning and 5 mg in the evening    metFORMIN (GLUCOPHAGE) 500 MG tablet Take 1,000 mg by mouth 2 (two) times daily with a meal.    pravastatin (PRAVACHOL) 20 MG tablet Take 20 mg by mouth daily.    aspirin EC 81 MG tablet Take 81 mg by mouth daily.      STOP taking these medications     lisinopril (PRINIVIL,ZESTRIL) 40 MG tablet          DISCHARGE INSTRUCTIONS:   Follow-up PMD in 2 weeks Follow-up urology 1 week  If you experience worsening of your admission symptoms, develop shortness of breath, life threatening emergency, suicidal or homicidal thoughts you must seek medical attention immediately by calling 911 or calling your MD immediately  if symptoms less severe.  You Must read complete instructions/literature along with all the possible adverse reactions/side effects for all the Medicines you take and that have been prescribed to you. Take any new Medicines after you have completely understood and accept all the possible adverse reactions/side effects.   Please note  You were cared for by a hospitalist during your hospital stay. If you have any questions about your discharge medications or the care you received while you were in the hospital after you are discharged, you can call the unit and asked to speak with the hospitalist on call if the hospitalist that took care of you is not available. Once you are discharged, your primary care physician will handle any further medical issues. Please note that NO REFILLS for any discharge medications will be authorized once you are discharged, as it is imperative that you return to your primary care physician (or establish a relationship with a primary care physician if you do not have one) for your aftercare needs so that  they can reassess your need for medications and monitor your lab values.    Today   CHIEF COMPLAINT:   Chief Complaint  Patient presents with  . Emesis  . Abdominal Pain    HISTORY OF PRESENT ILLNESS:  Dave Conrad  is a 41 y.o. male presented with nausea vomiting and fever. Patient was admitted with sepsis   VITAL SIGNS:  Blood pressure 139/81, pulse 100, temperature 98.9 F (37.2 C), temperature source Oral, resp. rate (!) 22, height 5\' 6"  (1.676 m), weight 120.3 kg (265 lb 3.4 oz), SpO2 96 %. Respiration 16 on discharge  PHYSICAL EXAMINATION:  GENERAL:  41 y.o.-year-old patient lying in the bed with no acute distress.  EYES: Pupils equal, round, reactive to light and accommodation. No scleral icterus. Extraocular muscles intact.  HEENT: Head atraumatic, normocephalic. Oropharynx and nasopharynx clear.  NECK:  Supple, no jugular venous distention. No thyroid enlargement, no tenderness.  LUNGS: Normal breath sounds bilaterally, no wheezing, rales,rhonchi or crepitation. No use of accessory muscles of respiration.  CARDIOVASCULAR: S1, S2 normal. No murmurs, rubs, or gallops.  ABDOMEN: Soft, non-tender, non-distended. Bowel sounds present. No organomegaly or mass.  EXTREMITIES: Trace edema. No cyanosis, or clubbing.  NEUROLOGIC: Cranial nerves II through XII are intact. Muscle strength 5/5 in all extremities. Sensation intact. Gait not checked.  PSYCHIATRIC: The patient is alert and oriented x 3.  SKIN: No obvious rash, lesion, or ulcer.   DATA REVIEW:   CBC  Recent Labs Lab 05/12/16 0448  WBC 18.9*  HGB 11.9*  HCT 34.8*  PLT 240    Chemistries   Recent Labs Lab 05/08/16 1640  05/09/16 1956  05/12/16 0448  NA 130*  --  130*  < > 133*  K 3.8  --  3.5  < > 3.3*  CL 97*  --  96*  < > 100*  CO2 23  --  24  < > 26  GLUCOSE 266*  --  252*  < > 151*  BUN 17  --  17  < > 22*  CREATININE 1.36*  < > 1.35*  < > 1.07  CALCIUM 8.5*  --  8.4*  < > 8.3*  MG  --   --   2.2  --   --   AST 45*  --   --   --   --   ALT 32  --   --   --   --   ALKPHOS 111  --   --   --   --   BILITOT 3.2*  --   --   --   --   < > = values in this interval not displayed.  Cardiac Enzymes  Recent Labs Lab 05/08/16 1348  TROPONINI 0.07*    Microbiology Results  Results for orders placed or performed during the hospital encounter of 05/07/16  Urine culture     Status: None   Collection Time: 05/07/16 12:59 PM  Result Value Ref Range Status   Specimen Description URINE, RANDOM  Final   Special Requests NONE  Final   Culture   Final    NO GROWTH Performed at [facility], [address], [place], [place] [address]    Report Status 05/09/2016 FINAL  Final  Blood Culture (routine x 2)     Status: Abnormal   Collection Time: 05/07/16  9:12 PM  Result Value Ref Range Status   Specimen Description BLOOD RIGHT HAND  Final   Special Requests BOTTLES DRAWN AEROBIC AND ANAEROBIC  BCAV  Final   Culture  Setup Time   Final    GRAM NEGATIVE RODS IN BOTH AEROBIC AND ANAEROBIC BOTTLES CRITICAL VALUE NOTED.  VALUE IS CONSISTENT WITH PREVIOUSLY REPORTED AND CALLED VALUE.    Culture (A)  Final    PROTEUS MIRABILIS SUSCEPTIBILITIES PERFORMED ON PREVIOUS CULTURE WITHIN THE LAST 5 DAYS. Performed at [facility], [address], [place], [place] [address]    Report Status 05/11/2016 FINAL  Final  Blood Culture (routine x 2)     Status: Abnormal   Collection Time: 05/07/16  9:12 PM  Result Value Ref Range Status   Specimen Description BLOOD LEFT ANTECUBITAL  Final   Special Requests BOTTLES DRAWN AEROBIC AND ANAEROBIC BCHV  Final   Culture  Setup Time   Final    GRAM NEGATIVE RODS IN BOTH AEROBIC AND ANAEROBIC BOTTLES CRITICAL RESULT CALLED TO, READ BACK BY AND VERIFIED WITH: SHEEMA HALLAJI AT 1833 ON 05/08/2016 JLJ Performed at [facility], [address], [place], [place] [address]    Culture PROTEUS MIRABILIS (A)  Final   Report Status 05/11/2016 FINAL   Final   Organism ID, Bacteria PROTEUS MIRABILIS  Final      Susceptibility   Proteus mirabilis - MIC*    AMPICILLIN <=  2 SENSITIVE Sensitive     CEFAZOLIN 8 SENSITIVE Sensitive     CEFEPIME <=1 SENSITIVE Sensitive     CEFTAZIDIME <=1 SENSITIVE Sensitive     CEFTRIAXONE <=1 SENSITIVE Sensitive     CIPROFLOXACIN <=0.25 SENSITIVE Sensitive     GENTAMICIN <=1 SENSITIVE Sensitive     IMIPENEM 2 SENSITIVE Sensitive     TRIMETH/SULFA <=20 SENSITIVE Sensitive     AMPICILLIN/SULBACTAM <=2 SENSITIVE Sensitive     PIP/TAZO <=4 SENSITIVE Sensitive     * PROTEUS MIRABILIS  Blood Culture ID Panel (Reflexed)     Status: Abnormal   Collection Time: 05/07/16  9:12 PM  Result Value Ref Range Status   Enterococcus species NOT DETECTED NOT DETECTED Final   Listeria monocytogenes NOT DETECTED NOT DETECTED Final   Staphylococcus species NOT DETECTED NOT DETECTED Final   Staphylococcus aureus NOT DETECTED NOT DETECTED Final   Streptococcus species NOT DETECTED NOT DETECTED Final   Streptococcus agalactiae NOT DETECTED NOT DETECTED Final   Streptococcus pneumoniae NOT DETECTED NOT DETECTED Final   Streptococcus pyogenes NOT DETECTED NOT DETECTED Final   Acinetobacter baumannii NOT DETECTED NOT DETECTED Final   Enterobacteriaceae species DETECTED (A) NOT DETECTED Final    Comment: Enterobacteriaceae represent a large family of gram-negative bacteria, not a single organism. CRITICAL RESULT CALLED TO, READ BACK BY AND VERIFIED WITH: SHEEMA HALLAJI AT 1833 ON 05/08/2016 JLJ    Enterobacter cloacae complex NOT DETECTED NOT DETECTED Final   Escherichia coli NOT DETECTED NOT DETECTED Final   Klebsiella oxytoca NOT DETECTED NOT DETECTED Final   Klebsiella pneumoniae NOT DETECTED NOT DETECTED Final   Proteus species DETECTED (A) NOT DETECTED Final    Comment: CRITICAL RESULT CALLED TO, READ BACK BY AND VERIFIED WITH: SHEEMA HALLAJI AT 1833 ON 05/08/2016 JLJ    Serratia marcescens NOT DETECTED NOT DETECTED  Final   Carbapenem resistance NOT DETECTED NOT DETECTED Final   Haemophilus influenzae NOT DETECTED NOT DETECTED Final   Neisseria meningitidis NOT DETECTED NOT DETECTED Final   Pseudomonas aeruginosa NOT DETECTED NOT DETECTED Final   Candida albicans NOT DETECTED NOT DETECTED Final   Candida glabrata NOT DETECTED NOT DETECTED Final   Candida krusei NOT DETECTED NOT DETECTED Final   Candida parapsilosis NOT DETECTED NOT DETECTED Final   Candida tropicalis NOT DETECTED NOT DETECTED Final  Respiratory Panel by PCR     Status: None   Collection Time: 05/08/16  5:25 PM  Result Value Ref Range Status   Adenovirus NOT DETECTED NOT DETECTED Final   Coronavirus 229E NOT DETECTED NOT DETECTED Final   Coronavirus HKU1 NOT DETECTED NOT DETECTED Final   Coronavirus NL63 NOT DETECTED NOT DETECTED Final   Coronavirus OC43 NOT DETECTED NOT DETECTED Final   Metapneumovirus NOT DETECTED NOT DETECTED Final   Rhinovirus / Enterovirus NOT DETECTED NOT DETECTED Final   Influenza A NOT DETECTED NOT DETECTED Final   Influenza B NOT DETECTED NOT DETECTED Final   Parainfluenza Virus 1 NOT DETECTED NOT DETECTED Final   Parainfluenza Virus 2 NOT DETECTED NOT DETECTED Final   Parainfluenza Virus 3 NOT DETECTED NOT DETECTED Final   Parainfluenza Virus 4 NOT DETECTED NOT DETECTED Final   Respiratory Syncytial Virus NOT DETECTED NOT DETECTED Final   Bordetella pertussis NOT DETECTED NOT DETECTED Final   Chlamydophila pneumoniae NOT DETECTED NOT DETECTED Final   Mycoplasma pneumoniae NOT DETECTED NOT DETECTED Final    Comment: Performed at [facility], [address] [place], [place] [address]  MRSA  PCR Screening     Status: None   Collection Time: 05/09/16  7:45 AM  Result Value Ref Range Status   MRSA by PCR NEGATIVE NEGATIVE Final    Comment:        The GeneXpert MRSA Assay (FDA approved for NASAL specimens only), is one component of a comprehensive MRSA colonization surveillance program. It is  not intended to diagnose MRSA infection nor to guide or monitor treatment for MRSA infections.   Urine culture     Status: Abnormal   Collection Time: 05/09/16  6:48 PM  Result Value Ref Range Status   Specimen Description URINE, CLEAN CATCH  Final   Special Requests NONE  Final   Culture (A)  Final    <10,000 COLONIES/mL INSIGNIFICANT GROWTH Performed at [facility], [address] [place], [place] [address]    Report Status 05/11/2016 FINAL  Final    Management plans discussed with the patient, and he is in agreement.  CODE STATUS:     Code Status Orders        Start     Ordered   05/08/16 0043  Full code  Continuous     05/08/16 0043    Code Status History    Date Active Date Inactive Code Status Order ID Comments User Context   This patient has a current code status but no historical code status.      TOTAL TIME TAKING CARE OF THIS PATIENT: 35 minutes.    [name], Dominyck Reser M.D on 05/12/2016 at 3:03 PM  Between 7am to 6pm - Pager - [phone]  After 6pm go to [email or web address] - Social research officer, governmentpassword [email or web address]  [facility] Office  [phone]  CC: Primary care physician; [facility]

## 2016-05-12 NOTE — Progress Notes (Signed)
Patient ID: Dave Conrad, male   DOB: 10-21-1975, 41 y.o.   MRN: [id number] [facility] - [place] [facility]        Dave Conrad was admitted to the Hospital on 05/07/2016 and Discharged  05/12/2016 and should be excused from work/school   for 12  days starting 05/07/2016 , may return to work/school without any restrictions.  [name], [name] M.D on 05/12/2016,at 9:13 AM  [employer] - [place] at [facility]    Office  [phone]

## 2016-05-12 NOTE — Progress Notes (Signed)
Patient discharged to home. Patient voided, no complications noted. IV site removed.discharge paper work and prescription provided.

## 2016-05-12 NOTE — Telephone Encounter (Signed)
-----   Message from Crist FatBenjamin W Herrick, MD sent at 05/10/2016  8:48 AM EDT ----- Regarding: f/u This patient needs to be scheduled for f/u in 2 weeks with Dr. Apolinar JunesBrandon to discuss PCNL.  Currently in the hospital in the ICU in critical condition.  Thanks,b

## 2016-05-13 LAB — HELPER T-LYMPH-CD4 (ARMC ONLY)
% CD 4 Pos. Lymph.: 30.5 % — ABNORMAL LOW (ref 30.8–58.5)
Absolute CD 4 Helper: 580 /uL (ref 359–1519)
BASOS: 0 %
Basophils Absolute: 0 10*3/uL (ref 0.0–0.2)
EOS (ABSOLUTE): 0.1 10*3/uL (ref 0.0–0.4)
EOS: 0 %
HEMOGLOBIN: 8.2 g/dL — AB (ref 13.0–17.7)
Hematocrit: 25.2 % — ABNORMAL LOW (ref 37.5–51.0)
IMMATURE GRANULOCYTES: 2 %
Immature Grans (Abs): 0.3 10*3/uL — ABNORMAL HIGH (ref 0.0–0.1)
LYMPHS ABS: 1.9 10*3/uL (ref 0.7–3.1)
Lymphs: 13 %
MCH: 26.2 pg — ABNORMAL LOW (ref 26.6–33.0)
MCHC: 32.5 g/dL (ref 31.5–35.7)
MCV: 81 fL (ref 79–97)
MONOS ABS: 1.4 10*3/uL — AB (ref 0.1–0.9)
Monocytes: 10 %
NEUTROS ABS: 11 10*3/uL — AB (ref 1.4–7.0)
NEUTROS PCT: 75 %
PLATELETS: 339 10*3/uL (ref 150–379)
RBC: 3.13 x10E6/uL — ABNORMAL LOW (ref 4.14–5.80)
RDW: 16.7 % — ABNORMAL HIGH (ref 12.3–15.4)
WBC: 14.7 10*3/uL — AB (ref 3.4–10.8)

## 2016-05-19 ENCOUNTER — Telehealth: Payer: Self-pay | Admitting: Urology

## 2016-05-19 ENCOUNTER — Encounter: Payer: Self-pay | Admitting: Urology

## 2016-05-19 NOTE — Telephone Encounter (Signed)
Patient has an appointment with you on Thursday and he is asking for a return to work note before this. He is asking if he can go back today? He work second shift and wants to return to work until he has his surgery. If he can let me know and I will write it up.  Thanks,  Dave DusterMichelle

## 2016-05-19 NOTE — Telephone Encounter (Signed)
Sure.  Vanna ScotlandAshley Kashauna Celmer, MD

## 2016-05-21 ENCOUNTER — Ambulatory Visit (INDEPENDENT_AMBULATORY_CARE_PROVIDER_SITE_OTHER): Payer: Managed Care, Other (non HMO) | Admitting: Urology

## 2016-05-21 ENCOUNTER — Encounter: Payer: Self-pay | Admitting: Urology

## 2016-05-21 VITALS — BP 150/102 | HR 101 | Ht 67.0 in | Wt 257.8 lb

## 2016-05-21 DIAGNOSIS — N200 Calculus of kidney: Secondary | ICD-10-CM

## 2016-05-21 DIAGNOSIS — N35112 Postinfective bulbous urethral stricture, not elsewhere classified: Secondary | ICD-10-CM

## 2016-05-21 DIAGNOSIS — A4159 Other Gram-negative sepsis: Secondary | ICD-10-CM | POA: Diagnosis not present

## 2016-05-21 LAB — URINALYSIS, COMPLETE
Bilirubin, UA: NEGATIVE
KETONES UA: NEGATIVE
Nitrite, UA: NEGATIVE
PH UA: 5.5 (ref 5.0–7.5)
Protein, UA: NEGATIVE
RBC UA: NEGATIVE
Specific Gravity, UA: 1.01 (ref 1.005–1.030)
UUROB: 0.2 mg/dL (ref 0.2–1.0)

## 2016-05-21 LAB — MICROSCOPIC EXAMINATION
Bacteria, UA: NONE SEEN
Epithelial Cells (non renal): NONE SEEN /hpf (ref 0–10)
RBC MICROSCOPIC, UA: NONE SEEN /HPF (ref 0–?)

## 2016-05-21 NOTE — Progress Notes (Signed)
05/21/2016 2:19 PM   Jarrah Azucena Kuba April 09, 1975 [id number]  Referring provider: [facility] [address] [place], [place] [address]  Chief Complaint  Patient presents with  . New Patient (Initial Visit)    kidney stone    HPI: 41 year old male who presents today to discuss definitive management of the stone. 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 The ureteral stent was placed emergently in the setting of Proteus bacteremia which was pansensitive and discharged with Kelfex x 14 days.    Incidentally, he was noted to have bulbar urethral stricture was noted in his Foley catheter was maintained for 3 days postop.  Since discharge, he has done quite well. He denies any flank pain or bladder irritation symptoms.  Good urinary stream without dysuria or gross hematuria.  He has no personal history of stones.  He reports that his HIV is well controlled with a low viral load.   PMH: Past Medical History:  Diagnosis Date  . Asplenia 05/08/2016  . Diabetes mellitus without complication (HCC)   . HIV (human immunodeficiency virus infection) (HCC)   . Hypertension     Surgical History: Past Surgical History:  Procedure Laterality Date  . CYSTOSCOPY WITH STENT PLACEMENT Right 05/09/2016   Procedure: CYSTOSCOPY WITH STENT PLACEMENT and retrorade pyelogram;  Surgeon: Crist Fat, MD;  Location: [facility] ORS;  Service: Urology;  Laterality: Right;    Home Medications:  Allergies as of 05/21/2016      Reactions   Penicillins Nausea And Vomiting   Has patient had a PCN reaction causing immediate rash, facial/tongue/throat swelling, SOB or lightheadedness with hypotension: yes rash Has patient had a PCN reaction causing severe rash involving mucus membranes or skin necrosis:no Has patient had a PCN reaction that required  hospitalization no Has patient had a PCN reaction occurring within the last 10 years: yes If all of the above answers are "NO", then may proceed with Cephalosporin use.      Medication List       Accurate as of 05/21/16 11:59 PM. Always use your most recent med list.          aspirin EC 81 MG tablet Take 81 mg by mouth daily.   cephALEXin 500 MG capsule Commonly known as:  KEFLEX Take 1 capsule (500 mg total) by mouth every 8 (eight) hours.   diltiazem 180 MG 24 hr capsule Commonly known as:  DILACOR XR Take 180 mg by mouth daily.   efavirenz-emtricitabine-tenofovir 600-200-300 MG tablet Commonly known as:  ATRIPLA Take 1 tablet by mouth at bedtime.   glipiZIDE 10 MG 24 hr tablet Commonly known as:  GLUCOTROL XL Take 5-10 mg by mouth 2 (two) times daily. Take 10 mg in the morning and 5 mg in the evening   metFORMIN 500 MG tablet Commonly known as:  GLUCOPHAGE Take 1,000 mg by mouth 2 (two) times daily with a meal.   pravastatin 20 MG tablet Commonly known as:  PRAVACHOL Take 20 mg by mouth daily.       Allergies:  Allergies  Allergen Reactions  . Penicillins Nausea And Vomiting    Has patient had a PCN reaction causing immediate rash, facial/tongue/throat swelling, SOB or lightheadedness with hypotension: yes rash Has patient had a PCN reaction causing severe rash involving mucus membranes or skin necrosis:no Has patient had a PCN reaction  that required hospitalization no Has patient had a PCN reaction occurring within the last 10 years: yes If all of the above answers are "NO", then may proceed with Cephalosporin use.     Family History: Family History  Problem Relation Age of Onset  . Kidney cancer Neg Hx   . Kidney disease Neg Hx   . Prostate cancer Neg Hx     Social History:  reports that he has quit smoking. His smoking use included Cigarettes. He has never used smokeless tobacco. He reports that he does not drink alcohol. His drug history is not on  file.  ROS: UROLOGY Frequent Urination?: No Hard to postpone urination?: No Burning/pain with urination?: No Get up at night to urinate?: No Leakage of urine?: No Urine stream starts and stops?: No Trouble starting stream?: No Do you have to strain to urinate?: No Blood in urine?: No Urinary tract infection?: No Sexually transmitted disease?: No Injury to kidneys or bladder?: No Painful intercourse?: No Weak stream?: No Erection problems?: No Penile pain?: No  Gastrointestinal Nausea?: No Vomiting?: No Indigestion/heartburn?: No Diarrhea?: No Constipation?: No  Constitutional Fever: No Night sweats?: No Weight loss?: No Fatigue?: No  Skin Skin rash/lesions?: No Itching?: No  Eyes Blurred vision?: No Double vision?: No  Ears/Nose/Throat Sore throat?: No Sinus problems?: No  Hematologic/Lymphatic Swollen glands?: No Easy bruising?: No  Cardiovascular Leg swelling?: No Chest pain?: No  Respiratory Cough?: No Shortness of breath?: No  Endocrine Excessive thirst?: No  Musculoskeletal Back pain?: No Joint pain?: No  Neurological Headaches?: No Dizziness?: No  Psychologic Depression?: No Anxiety?: No  Physical Exam: BP (!) 150/102   Pulse (!) 101   Ht 5\' 7"  (1.702 m)   Wt 257 lb 12.8 oz (116.9 kg)   BMI 40.38 kg/m   Constitutional:  Alert and oriented, No acute distress. HEENT: [place] AT, moist mucus membranes.  Trachea midline, no masses. Cardiovascular: No clubbing, cyanosis, or edema. Respiratory: Normal respiratory effort, no increased work of breathing. GI: Abdomen is soft, nontender, nondistended, no abdominal masses GU: No CVA tenderness.  Skin: No rashes, bruises or suspicious lesions. Neurologic: Grossly intact, no focal deficits, moving all 4 extremities. Psychiatric: Normal mood and affect.  Laboratory Data: Lab Results  Component Value Date   WBC 18.9 (H) 05/12/2016   WBC 14.7 (H) 05/12/2016   HGB 11.9 (L) 05/12/2016    HCT 34.8 (L) 05/12/2016   HCT 25.2 (L) 05/12/2016   MCV 79.2 (L) 05/12/2016   MCV 81 05/12/2016   PLT 240 05/12/2016   PLT 339 05/12/2016    Lab Results  Component Value Date   CREATININE 1.07 05/12/2016    Lab Results  Component Value Date   HGBA1C 7.5 (H) 05/08/2016    Urinalysis    Component Value Date/Time   COLORURINE AMBER (A) 05/07/2016 1259   APPEARANCEUR Clear 05/21/2016 1120   LABSPEC 1.033 (H) 05/07/2016 1259   PHURINE 5.0 05/07/2016 1259   GLUCOSEU 3+ (A) 05/21/2016 1120   HGBUR MODERATE (A) 05/07/2016 1259   BILIRUBINUR Negative 05/21/2016 1120   KETONESUR 80 (A) 05/07/2016 1259   PROTEINUR Negative 05/21/2016 1120   PROTEINUR 100 (A) 05/07/2016 1259   NITRITE Negative 05/21/2016 1120   NITRITE NEGATIVE 05/07/2016 1259   LEUKOCYTESUR Trace (A) 05/21/2016 1120    Pertinent Imaging: CLINICAL DATA:  41 year old male with dyspnea, nausea, vomiting and diarrhea for the past several days  EXAM: CT CHEST, ABDOMEN AND PELVIS WITHOUT CONTRAST  TECHNIQUE: Multidetector CT imaging of  the chest, abdomen and pelvis was performed following the standard protocol without IV contrast.  COMPARISON:  None.  FINDINGS: CT CHEST FINDINGS  Cardiovascular: Limited evaluation in the absence of intravenous contrast. The heart is normal in size. No aortic aneurysm. Small volume pericardial fluid.  Mediastinum/Nodes: Unremarkable CT appearance of the thyroid gland. No suspicious mediastinal or hilar adenopathy. No soft tissue mediastinal mass. The thoracic esophagus is unremarkable.  Lungs/Pleura: Bilateral ground-glass attenuation airspace opacity and consolidation throughout the lungs bilaterally in a peribronchovascular distribution. Findings are most consistent with a multifocal infectious/ inflammatory process. No interlobular septal thickening, pleural effusion, pneumothorax or nodularity.  Musculoskeletal: No acute fracture or aggressive appearing  lytic or blastic osseous lesion.  CT ABDOMEN PELVIS FINDINGS  Hepatobiliary: No focal liver abnormality is seen. No gallstones, gallbladder wall thickening, or biliary dilatation.  Pancreas: Unremarkable. No pancreatic ductal dilatation or surrounding inflammatory changes.  Spleen: Normal in size without focal abnormality.  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 There is moderate hydronephrosis in the upper and interpolar regions. At least partially obstructing 7 x 12 mm calculus in the right mid to upper ureter. There is associated is right renal edema and mild perinephric stranding. No stones present in the left kidney. The adrenal glands are normal. Unremarkable bladder.  Stomach/Bowel: No evidence of obstruction or focal bowel wall thickening. Normal appendix in the right lower quadrant. The terminal ileum is unremarkable.  Vascular/Lymphatic: No significant vascular findings are present. No enlarged abdominal or pelvic lymph nodes.  Reproductive: Prostate is unremarkable.  Other: Small fat containing umbilical hernia.  Musculoskeletal: No acute fracture or aggressive appearing lytic or blastic osseous lesion.  IMPRESSION: CT CHEST  1. Consolidative changes and ground-glass attenuation airspace opacities in a peribronchovascular distribution throughout all lobes of both lungs. The imaging appearance is most consistent with a multi lobar infectious/inflammatory process such as multi lobar pneumonia, an atypical or viral respiratory illness, or acute pneumonitis. 2. Small pericardial effusion. CT ABD/PELVIS  1. Obstructing 7 x 12 mm stone in the right proximal to mid ureter with resultant moderate right-sided hydronephrosis, renal edema and perinephric stranding. 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 2. Small fat  containing umbilical hernia.   Electronically Signed   By: Malachy Moan M.D.   On: 05/09/2016 13:42  CT scan personally reviewed today and with the patient.  Assessment & Plan:    1. Right nephrolithiasis Large right partial staghorn calculus with 12 mm mid ureteral calculus status post emergent ureteral stent in the setting of Proteus bacteremia.  He returned to the office today discuss definitive management of his stones. I recommended pursuing percutaneous nephrolithotomy with antegrade ureteroscopy to address his renal stone burden as well as ureteral stone burden.  Risks of the procedure were reviewed today in detail including risk of bleeding, infection, damage to surrounding structures, need for possible blood transfusion, sepsis, need for multiple procedures amongst others. All questions were answered in detail. He understands the procedure or typical routine postop course. All of his questions were answered today.  - Urinalysis, Complete - CULTURE, URINE COMPREHENSIVE  2. Proteus septicemia (HCC) Suspect stone likely struvite stone in the setting of previous Proteus bacteremia. We will cover him with double antibiotics with time of the procedure. He understands the risk of recurrent sepsis given possible infected stone.    3. Postinfective stricture of bulbous urethra Discussed today that he will need  a Foley catheter during the procedure. If necessary, we may need to dilate his bulbar urethra again.  In this case, he would require a longer duration Foley and risk and benefits were reviewed.    Schedule  right PCNL  Vanna ScotlandAshley Amzie Sillas, MD  [facility] [address], [address] [place], [place] [address] [phone]  I spent 25 min with this patient of which greater than 50% was spent in counseling and coordination of care with the patient.

## 2016-05-27 ENCOUNTER — Other Ambulatory Visit: Payer: Self-pay | Admitting: Radiology

## 2016-05-27 DIAGNOSIS — N200 Calculus of kidney: Secondary | ICD-10-CM

## 2016-05-27 DIAGNOSIS — N135 Crossing vessel and stricture of ureter without hydronephrosis: Secondary | ICD-10-CM | POA: Insufficient documentation

## 2016-05-28 LAB — CULTURE, URINE COMPREHENSIVE

## 2016-05-29 ENCOUNTER — Other Ambulatory Visit: Payer: Self-pay | Admitting: Radiology

## 2016-05-29 DIAGNOSIS — N200 Calculus of kidney: Secondary | ICD-10-CM

## 2016-05-29 DIAGNOSIS — N35112 Postinfective bulbous urethral stricture, not elsewhere classified: Secondary | ICD-10-CM

## 2016-06-01 ENCOUNTER — Telehealth: Payer: Self-pay | Admitting: Radiology

## 2016-06-01 NOTE — Telephone Encounter (Signed)
Notified pt of surgery & pre-admit testing appts & to arrive to [facility] on 06/22/16 :15. Advised pt to hold ASA  x7 days prior to surgery. Questions were answered to patient's satisfaction & pt voices understanding.

## 2016-06-03 ENCOUNTER — Encounter: Payer: Self-pay | Admitting: Urology

## 2016-06-03 NOTE — Telephone Encounter (Signed)
Right PCNL/ antegrade ureteroscopy  Received: 1 week ago  Message Contents  Vanna Scotland, MD  Dave Iyer Georgie Chard, RN        Please go ahead and get this patient scheduled for right antegrade ureteroscopy, right PCNL, possible cystoscopy/ urethra dilation.   He will need a CBC, BMP, PT/INR,urine culture (ordered in the office last week but doesn't look like was sent).   Preop abx gent (dosing per pharmacy, amp 1 g)   SCDs   He probably some disability paperwork for the actual surgery which I unfortunately has been my bag. But him know that we can get these papers next week or the following   Vanna Scotland, MD

## 2016-06-04 ENCOUNTER — Ambulatory Visit: Payer: Self-pay | Admitting: Urology

## 2016-06-08 ENCOUNTER — Encounter
Admission: RE | Admit: 2016-06-08 | Discharge: 2016-06-08 | Disposition: A | Discharge status: Home / Self Care | Payer: Managed Care, Other (non HMO) | Source: Ambulatory Visit | Attending: Urology | Admitting: Urology

## 2016-06-08 DIAGNOSIS — Z01812 Encounter for preprocedural laboratory examination: Secondary | ICD-10-CM | POA: Diagnosis not present

## 2016-06-08 DIAGNOSIS — Z01818 Encounter for other preprocedural examination: Secondary | ICD-10-CM | POA: Diagnosis present

## 2016-06-08 DIAGNOSIS — I10 Essential (primary) hypertension: Secondary | ICD-10-CM

## 2016-06-08 DIAGNOSIS — N200 Calculus of kidney: Secondary | ICD-10-CM | POA: Insufficient documentation

## 2016-06-08 HISTORY — DX: Personal history of other infectious and parasitic diseases: Z86.19

## 2016-06-08 HISTORY — DX: Personal history of urinary calculi: Z87.442

## 2016-06-08 LAB — BASIC METABOLIC PANEL
Anion gap: 11 (ref 5–15)
BUN: 12 mg/dL (ref 6–20)
CALCIUM: 9.1 mg/dL (ref 8.9–10.3)
CO2: 25 mmol/L (ref 22–32)
CREATININE: 0.79 mg/dL (ref 0.61–1.24)
Chloride: 103 mmol/L (ref 101–111)
GFR calc Af Amer: >60 mL/min (ref >60)
GFR calc non Af Amer: >60 mL/min (ref >60)
Glucose, Bld: 128 mg/dL — ABNORMAL HIGH (ref 65–99)
Potassium: 3.4 mmol/L — ABNORMAL LOW (ref 3.5–5.1)
Sodium: 139 mmol/L (ref 135–145)

## 2016-06-08 LAB — CBC
HCT: 35 % — ABNORMAL LOW (ref 40.0–52.0)
Hemoglobin: 11.7 g/dL — ABNORMAL LOW (ref 13.0–18.0)
MCH: 26.3 pg (ref 26.0–34.0)
MCHC: 33.5 g/dL (ref 32.0–36.0)
MCV: 78.6 fL — ABNORMAL LOW (ref 80.0–100.0)
Platelets: 287 10*3/uL (ref 150–440)
RBC: 4.46 MIL/uL (ref 4.40–5.90)
RDW: 15.3 % — ABNORMAL HIGH (ref 11.5–14.5)
WBC: 8.4 10*3/uL (ref 3.8–10.6)

## 2016-06-08 LAB — PROTIME-INR
INR: 0.99
PROTHROMBIN TIME: 13.1 s (ref 11.4–15.2)

## 2016-06-08 LAB — APTT: APTT: 27 s (ref 24–36)

## 2016-06-08 NOTE — Patient Instructions (Signed)
Your procedure is scheduled on: June 22, 2016 (Monday) Report to Same Day Surgery 2nd floor medical mall [facility] Entrance-take elevator on left to 2nd floor.  Check in with surgery information desk.) To find out your arrival time please call [phone] between 1PM - 3PM on  June 19, 2016 (Friday)  Remember: Instructions that are not followed completely may result in serious medical risk, up to and including death, or upon the discretion of your surgeon and anesthesiologist your surgery may need to be rescheduled.    _x___ 1. Do not eat food or drink liquids after midnight. No gum chewing or  hard  candies                            .     __x__ 2. No Alcohol for 24 hours before or after surgery.   __x__3. No Smoking for 24 prior to surgery.   ____  4. Bring all medications with you on the day of surgery if instructed.    __x__ 5. Notify your doctor if there is any change in your medical condition     (cold, fever, infections).     Do not wear jewelry, make-up, hairpins, clips or nail polish.  Do not wear lotions, powders, or perfumes. You may wear deodorant.  Do not shave 48 hours prior to surgery. Men may shave face and neck.  Do not bring valuables to the hospital.    [facility] is not responsible for any belongings or valuables.               Contacts, dentures or bridgework may not be worn into surgery.  Leave your suitcase in the car. After surgery it may be brought to your room.  For patients admitted to the hospital, discharge time is determined by your  treatment team                     Patients discharged the day of surgery will not be allowed to drive home.  You will need someone to drive you home and stay with you the night of your procedure.    Please read over the following fact sheets that you were given:   [facility] Preparing for Surgery and or MRSA Information   _x___ Take anti-hypertensive (unless it includes a diuretic), cardiac, seizure, asthma,      anti-reflux and psychiatric medicines with a sip of water These include:  1. Diltiazem  2.  3.  4.  5.  6.  ____Fleets enema or Magnesium Citrate as directed.   __ Use CHG Soap or sage wipes as directed on instruction sheet   ____ Use inhalers on the day of surgery and bring to hospital day of surgery  _x___ Stop Metformin and Janumet 2 days prior to surgery. (STOP METFORMIN ON APRIL  28 )   ____ Take 1/2 of usual insulin dose the night before surgery and none on the morning     surgery.   _x___ Follow recommendations from Cardiologist, Pulmonologist or PCP regarding          stopping Aspirin, Coumadin, Pllavix ,Eliquis, Effient, or Pradaxa, and Pletal. (STOP ASPIRIN SEVEN DAYS PRIOR TO SURGERY )  X____Stop Anti-inflammatories such as Advil, Aleve, Ibuprofen, Motrin, Naproxen, Naprosyn, Goodies powders or aspirin products. OK to take Tylenol   _x___ Stop supplements until after surgery.  But may continue Vitamin D, Vitamin B, and multivitamin  (STOP SAW  PALMETTO NOW)  ____ Bring C-Pap to the hospital.

## 2016-06-08 NOTE — Progress Notes (Signed)
Pharmacy Consult to Dose Gentamicin for surgical prophylaxis.  Ordered 5 mg/kg dose of 590 mg.

## 2016-06-19 ENCOUNTER — Other Ambulatory Visit: Payer: Self-pay | Admitting: Radiology

## 2016-06-22 ENCOUNTER — Ambulatory Visit: Payer: Managed Care, Other (non HMO)

## 2016-06-22 ENCOUNTER — Observation Stay
Admission: RE | Admit: 2016-06-22 | Discharge: 2016-06-23 | Disposition: A | Discharge status: Home / Self Care | Payer: Managed Care, Other (non HMO) | Source: Ambulatory Visit | Attending: Urology | Admitting: Urology

## 2016-06-22 ENCOUNTER — Ambulatory Visit
Admission: RE | Admit: 2016-06-22 | Discharge: 2016-06-22 | Disposition: A | Discharge status: Home / Self Care | Payer: Managed Care, Other (non HMO) | Source: Ambulatory Visit | Attending: Urology | Admitting: Urology

## 2016-06-22 ENCOUNTER — Encounter: Payer: Self-pay | Admitting: Anesthesiology

## 2016-06-22 ENCOUNTER — Ambulatory Visit: Payer: Managed Care, Other (non HMO) | Admitting: Anesthesiology

## 2016-06-22 ENCOUNTER — Encounter
Admission: RE | Disposition: A | Discharge status: Home / Self Care | Payer: Self-pay | Source: Ambulatory Visit | Attending: Urology

## 2016-06-22 DIAGNOSIS — Z87891 Personal history of nicotine dependence: Secondary | ICD-10-CM | POA: Diagnosis not present

## 2016-06-22 DIAGNOSIS — Z880 Allergy status to penicillin: Secondary | ICD-10-CM | POA: Diagnosis not present

## 2016-06-22 DIAGNOSIS — I10 Essential (primary) hypertension: Secondary | ICD-10-CM | POA: Insufficient documentation

## 2016-06-22 DIAGNOSIS — Z7982 Long term (current) use of aspirin: Secondary | ICD-10-CM | POA: Diagnosis not present

## 2016-06-22 DIAGNOSIS — E119 Type 2 diabetes mellitus without complications: Secondary | ICD-10-CM | POA: Diagnosis not present

## 2016-06-22 DIAGNOSIS — Z7984 Long term (current) use of oral hypoglycemic drugs: Secondary | ICD-10-CM | POA: Insufficient documentation

## 2016-06-22 DIAGNOSIS — N200 Calculus of kidney: Secondary | ICD-10-CM

## 2016-06-22 DIAGNOSIS — N35112 Postinfective bulbous urethral stricture, not elsewhere classified: Secondary | ICD-10-CM

## 2016-06-22 DIAGNOSIS — Z6841 Body Mass Index (BMI) 40.0 and over, adult: Secondary | ICD-10-CM | POA: Diagnosis not present

## 2016-06-22 DIAGNOSIS — Z79899 Other long term (current) drug therapy: Secondary | ICD-10-CM | POA: Insufficient documentation

## 2016-06-22 DIAGNOSIS — Z21 Asymptomatic human immunodeficiency virus [HIV] infection status: Secondary | ICD-10-CM | POA: Diagnosis not present

## 2016-06-22 DIAGNOSIS — N202 Calculus of kidney with calculus of ureter: Secondary | ICD-10-CM | POA: Diagnosis present

## 2016-06-22 DIAGNOSIS — Z419 Encounter for procedure for purposes other than remedying health state, unspecified: Secondary | ICD-10-CM

## 2016-06-22 HISTORY — PX: CYSTOSCOPY WITH URETHRAL DILATATION: SHX5125

## 2016-06-22 HISTORY — PX: NEPHROLITHOTOMY: SHX5134

## 2016-06-22 HISTORY — PX: IR NEPHROSTOMY PLACEMENT RIGHT: IMG6064

## 2016-06-22 HISTORY — PX: HOLMIUM LASER APPLICATION: SHX5852

## 2016-06-22 HISTORY — PX: URETEROSCOPY: SHX842

## 2016-06-22 LAB — CBC
HEMATOCRIT: 37.9 % — AB (ref 40.0–52.0)
Hemoglobin: 12.8 g/dL — ABNORMAL LOW (ref 13.0–18.0)
MCH: 26.1 pg (ref 26.0–34.0)
MCHC: 33.7 g/dL (ref 32.0–36.0)
MCV: 77.6 fL — ABNORMAL LOW (ref 80.0–100.0)
Platelets: 231 10*3/uL (ref 150–440)
RBC: 4.88 MIL/uL (ref 4.40–5.90)
RDW: 15.6 % — AB (ref 11.5–14.5)
WBC: 7.5 10*3/uL (ref 3.8–10.6)

## 2016-06-22 LAB — TYPE AND SCREEN
ABO/RH(D): A POS
ANTIBODY SCREEN: NEGATIVE

## 2016-06-22 LAB — GLUCOSE, CAPILLARY
GLUCOSE-CAPILLARY: 257 mg/dL — AB (ref 65–99)
Glucose-Capillary: 157 mg/dL — ABNORMAL HIGH (ref 65–99)
Glucose-Capillary: 227 mg/dL — ABNORMAL HIGH (ref 65–99)

## 2016-06-22 LAB — BASIC METABOLIC PANEL
Anion gap: 9 (ref 5–15)
BUN: 13 mg/dL (ref 6–20)
CO2: 27 mmol/L (ref 22–32)
Calcium: 9.1 mg/dL (ref 8.9–10.3)
Chloride: 104 mmol/L (ref 101–111)
Creatinine, Ser: 0.91 mg/dL (ref 0.61–1.24)
GFR calc Af Amer: >60 mL/min (ref >60)
GLUCOSE: 163 mg/dL — AB (ref 65–99)
POTASSIUM: 3.3 mmol/L — AB (ref 3.5–5.1)
Sodium: 140 mmol/L (ref 135–145)

## 2016-06-22 LAB — ABO/RH: ABO/RH(D): A POS

## 2016-06-22 LAB — APTT: APTT: 27 s (ref 24–36)

## 2016-06-22 LAB — PROTIME-INR
INR: 1.02
Prothrombin Time: 13.4 seconds (ref 11.4–15.2)

## 2016-06-22 SURGERY — NEPHROLITHOTOMY PERCUTANEOUS
Anesthesia: General | Site: Back | Laterality: Right | Wound class: Clean

## 2016-06-22 MED ORDER — SUGAMMADEX SODIUM 500 MG/5ML IV SOLN
INTRAVENOUS | Status: AC
  Filled 2016-06-22: qty 5

## 2016-06-22 MED ORDER — ROCURONIUM BROMIDE 100 MG/10ML IV SOLN
INTRAVENOUS | Status: DC | PRN
  Administered 2016-06-22: 10 mg via INTRAVENOUS
  Administered 2016-06-22: 40 mg via INTRAVENOUS

## 2016-06-22 MED ORDER — FENTANYL CITRATE (PF) 100 MCG/2ML IJ SOLN: 25.0000 ug | INTRAMUSCULAR | Status: DC | PRN

## 2016-06-22 MED ORDER — PROPOFOL 10 MG/ML IV BOLUS
INTRAVENOUS | Status: DC | PRN
  Administered 2016-06-22: 150 mg via INTRAVENOUS

## 2016-06-22 MED ORDER — MIDAZOLAM HCL 5 MG/5ML IJ SOLN
INTRAMUSCULAR | Status: AC
  Filled 2016-06-22: qty 5

## 2016-06-22 MED ORDER — SODIUM CHLORIDE FLUSH 0.9 % IV SOLN
INTRAVENOUS | Status: AC
  Administered 2016-06-22: 5 mL via INTRAVENOUS
  Filled 2016-06-22: qty 10

## 2016-06-22 MED ORDER — FENTANYL CITRATE (PF) 100 MCG/2ML IJ SOLN
INTRAMUSCULAR | Status: AC
  Filled 2016-06-22: qty 2

## 2016-06-22 MED ORDER — MIDAZOLAM HCL 2 MG/2ML IJ SOLN
INTRAMUSCULAR | Status: AC | PRN
  Administered 2016-06-22 (×2): 1 mg via INTRAVENOUS

## 2016-06-22 MED ORDER — EFAVIRENZ-EMTRICITAB-TENOFOVIR 600-200-300 MG PO TABS
1.0000 | ORAL_TABLET | Freq: Every day | ORAL | Status: DC
  Administered 2016-06-22: 1 via ORAL
  Filled 2016-06-22 (×2): qty 1

## 2016-06-22 MED ORDER — GLIPIZIDE ER 5 MG PO TB24
5.0000 mg | ORAL_TABLET | Freq: Every day | ORAL | Status: DC
  Administered 2016-06-22: 5 mg via ORAL
  Filled 2016-06-22: qty 1

## 2016-06-22 MED ORDER — SUCCINYLCHOLINE CHLORIDE 20 MG/ML IJ SOLN
INTRAMUSCULAR | Status: DC | PRN
  Administered 2016-06-22: 120 mg via INTRAVENOUS

## 2016-06-22 MED ORDER — OXYBUTYNIN CHLORIDE 5 MG PO TABS
5.0000 mg | ORAL_TABLET | Freq: Three times a day (TID) | ORAL | Status: DC | PRN
  Filled 2016-06-22: qty 1

## 2016-06-22 MED ORDER — PHENYLEPHRINE HCL 10 MG/ML IJ SOLN
INTRAMUSCULAR | Status: AC
  Filled 2016-06-22: qty 1

## 2016-06-22 MED ORDER — LIDOCAINE HCL (PF) 1 % IJ SOLN
INTRAMUSCULAR | Status: AC
  Filled 2016-06-22: qty 30

## 2016-06-22 MED ORDER — OXYCODONE-ACETAMINOPHEN 5-325 MG PO TABS: 1.0000 | ORAL_TABLET | ORAL | Status: DC | PRN

## 2016-06-22 MED ORDER — PRAVASTATIN SODIUM 20 MG PO TABS
20.0000 mg | ORAL_TABLET | Freq: Every evening | ORAL | Status: DC
  Administered 2016-06-22: 20 mg via ORAL
  Filled 2016-06-22: qty 1

## 2016-06-22 MED ORDER — CIPROFLOXACIN IN D5W 400 MG/200ML IV SOLN
400.0000 mg | Freq: Once | INTRAVENOUS | Status: AC
  Administered 2016-06-22: 400 mg via INTRAVENOUS

## 2016-06-22 MED ORDER — MIDAZOLAM HCL 2 MG/2ML IJ SOLN
INTRAMUSCULAR | Status: DC | PRN
Start: 2016-06-22 — End: 2016-06-22
  Administered 2016-06-22: 2 mg via INTRAVENOUS

## 2016-06-22 MED ORDER — PROMETHAZINE HCL 25 MG/ML IJ SOLN: 6.2500 mg | INTRAMUSCULAR | Status: DC | PRN

## 2016-06-22 MED ORDER — LIDOCAINE HCL (CARDIAC) 20 MG/ML IV SOLN
INTRAVENOUS | Status: DC | PRN
  Administered 2016-06-22: 50 mg via INTRAVENOUS

## 2016-06-22 MED ORDER — DEXAMETHASONE SODIUM PHOSPHATE 10 MG/ML IJ SOLN
INTRAMUSCULAR | Status: DC | PRN
  Administered 2016-06-22: 10 mg via INTRAVENOUS

## 2016-06-22 MED ORDER — ACETAMINOPHEN 325 MG PO TABS: 650.0000 mg | ORAL_TABLET | ORAL | Status: DC | PRN

## 2016-06-22 MED ORDER — ONDANSETRON HCL 4 MG/2ML IJ SOLN
INTRAMUSCULAR | Status: AC
  Filled 2016-06-22: qty 2

## 2016-06-22 MED ORDER — SUCCINYLCHOLINE CHLORIDE 20 MG/ML IJ SOLN
INTRAMUSCULAR | Status: AC
  Filled 2016-06-22: qty 1

## 2016-06-22 MED ORDER — GLIPIZIDE ER 5 MG PO TB24: 5.0000 mg | ORAL_TABLET | Freq: Two times a day (BID) | ORAL | Status: DC

## 2016-06-22 MED ORDER — SODIUM CHLORIDE 0.9 % IV SOLN
INTRAVENOUS | Status: DC
  Administered 2016-06-22 (×2): via INTRAVENOUS

## 2016-06-22 MED ORDER — AMPICILLIN SODIUM 1 G IJ SOLR
INTRAMUSCULAR | Status: AC
  Filled 2016-06-22: qty 1000

## 2016-06-22 MED ORDER — PROPOFOL 10 MG/ML IV BOLUS
INTRAVENOUS | Status: AC
  Filled 2016-06-22: qty 20

## 2016-06-22 MED ORDER — LISINOPRIL 20 MG PO TABS
40.0000 mg | ORAL_TABLET | Freq: Every day | ORAL | Status: DC
  Administered 2016-06-22 – 2016-06-23 (×2): 40 mg via ORAL
  Filled 2016-06-22 (×2): qty 2

## 2016-06-22 MED ORDER — SODIUM CHLORIDE 0.9 % IV SOLN: 1.0000 g | INTRAVENOUS | Status: DC

## 2016-06-22 MED ORDER — DIPHENHYDRAMINE HCL 12.5 MG/5ML PO ELIX
12.5000 mg | ORAL_SOLUTION | Freq: Four times a day (QID) | ORAL | Status: DC | PRN
  Filled 2016-06-22: qty 5

## 2016-06-22 MED ORDER — DIPHENHYDRAMINE HCL 50 MG/ML IJ SOLN
12.5000 mg | Freq: Four times a day (QID) | INTRAMUSCULAR | Status: DC | PRN
Start: 2016-06-22 — End: 2016-06-23

## 2016-06-22 MED ORDER — PHENYLEPHRINE HCL 10 MG/ML IJ SOLN
INTRAMUSCULAR | Status: DC | PRN
  Administered 2016-06-22 (×2): 100 ug via INTRAVENOUS

## 2016-06-22 MED ORDER — BELLADONNA ALKALOIDS-OPIUM 16.2-60 MG RE SUPP: 1.0000 | Freq: Four times a day (QID) | RECTAL | Status: DC | PRN

## 2016-06-22 MED ORDER — FENTANYL CITRATE (PF) 100 MCG/2ML IJ SOLN
INTRAMUSCULAR | Status: AC | PRN
  Administered 2016-06-22 (×2): 50 ug via INTRAVENOUS

## 2016-06-22 MED ORDER — MIDAZOLAM HCL 2 MG/2ML IJ SOLN
INTRAMUSCULAR | Status: AC
  Filled 2016-06-22: qty 2

## 2016-06-22 MED ORDER — IOPAMIDOL (ISOVUE-300) INJECTION 61%
30.0000 mL | Freq: Once | INTRAVENOUS | Status: AC | PRN
  Administered 2016-06-22: 15 mL

## 2016-06-22 MED ORDER — DILTIAZEM HCL ER COATED BEADS 180 MG PO CP24
180.0000 mg | ORAL_CAPSULE | Freq: Every day | ORAL | Status: DC
  Administered 2016-06-23: 180 mg via ORAL
  Filled 2016-06-22 (×2): qty 1

## 2016-06-22 MED ORDER — SODIUM CHLORIDE 0.9 % IV SOLN: INTRAVENOUS | Status: DC

## 2016-06-22 MED ORDER — SUGAMMADEX SODIUM 500 MG/5ML IV SOLN
INTRAVENOUS | Status: DC | PRN
  Administered 2016-06-22: 230 mg via INTRAVENOUS

## 2016-06-22 MED ORDER — ROCURONIUM BROMIDE 50 MG/5ML IV SOLN
INTRAVENOUS | Status: AC
  Filled 2016-06-22: qty 1

## 2016-06-22 MED ORDER — FAMOTIDINE 20 MG PO TABS
ORAL_TABLET | ORAL | Status: AC
  Administered 2016-06-22: 20 mg via ORAL
  Filled 2016-06-22: qty 1

## 2016-06-22 MED ORDER — SEVOFLURANE IN SOLN
RESPIRATORY_TRACT | Status: AC
  Filled 2016-06-22: qty 250

## 2016-06-22 MED ORDER — ONDANSETRON HCL 4 MG/2ML IJ SOLN: 4.0000 mg | INTRAMUSCULAR | Status: DC | PRN

## 2016-06-22 MED ORDER — METFORMIN HCL 500 MG PO TABS
1000.0000 mg | ORAL_TABLET | Freq: Two times a day (BID) | ORAL | Status: DC
  Administered 2016-06-22 – 2016-06-23 (×2): 1000 mg via ORAL
  Filled 2016-06-22 (×2): qty 2

## 2016-06-22 MED ORDER — GENTAMICIN SULFATE 40 MG/ML IJ SOLN
430.0000 mg | Freq: Once | INTRAVENOUS | Status: AC
  Administered 2016-06-22: 430 mg via INTRAVENOUS
  Filled 2016-06-22: qty 10.75

## 2016-06-22 MED ORDER — MORPHINE SULFATE (PF) 2 MG/ML IV SOLN: 2.0000 mg | INTRAVENOUS | Status: DC | PRN

## 2016-06-22 MED ORDER — SODIUM CHLORIDE 0.9 % IV SOLN
INTRAVENOUS | Status: DC
  Administered 2016-06-22 – 2016-06-23 (×2): via INTRAVENOUS

## 2016-06-22 MED ORDER — FENTANYL CITRATE (PF) 250 MCG/5ML IJ SOLN
INTRAMUSCULAR | Status: AC
  Filled 2016-06-22: qty 5

## 2016-06-22 MED ORDER — FAMOTIDINE 20 MG PO TABS
20.0000 mg | ORAL_TABLET | Freq: Once | ORAL | Status: AC
  Administered 2016-06-22: 20 mg via ORAL

## 2016-06-22 MED ORDER — CIPROFLOXACIN IN D5W 400 MG/200ML IV SOLN
INTRAVENOUS | Status: AC
  Filled 2016-06-22: qty 200

## 2016-06-22 MED ORDER — ACETAMINOPHEN 10 MG/ML IV SOLN
INTRAVENOUS | Status: DC | PRN
  Administered 2016-06-22: 1000 mg via INTRAVENOUS

## 2016-06-22 MED ORDER — DEXAMETHASONE SODIUM PHOSPHATE 10 MG/ML IJ SOLN
INTRAMUSCULAR | Status: AC
Start: 2016-06-22 — End: 2016-06-22
  Filled 2016-06-22: qty 1

## 2016-06-22 MED ORDER — FENTANYL CITRATE (PF) 100 MCG/2ML IJ SOLN
INTRAMUSCULAR | Status: DC | PRN
  Administered 2016-06-22 (×3): 50 ug via INTRAVENOUS
  Administered 2016-06-22: 100 ug via INTRAVENOUS

## 2016-06-22 MED ORDER — DOCUSATE SODIUM 100 MG PO CAPS
100.0000 mg | ORAL_CAPSULE | Freq: Two times a day (BID) | ORAL | Status: DC
  Administered 2016-06-23: 100 mg via ORAL
  Filled 2016-06-22 (×3): qty 1

## 2016-06-22 MED ORDER — IOTHALAMATE MEGLUMINE 43 % IV SOLN
INTRAVENOUS | Status: DC | PRN
  Administered 2016-06-22: 60 mL

## 2016-06-22 MED ORDER — GLIPIZIDE ER 10 MG PO TB24
10.0000 mg | ORAL_TABLET | Freq: Every day | ORAL | Status: DC
  Administered 2016-06-23: 10 mg via ORAL
  Filled 2016-06-22: qty 1

## 2016-06-22 MED ORDER — ONDANSETRON HCL 4 MG/2ML IJ SOLN
INTRAMUSCULAR | Status: DC | PRN
  Administered 2016-06-22: 4 mg via INTRAVENOUS

## 2016-06-22 MED ORDER — ACETAMINOPHEN NICU IV SYRINGE 10 MG/ML
INTRAVENOUS | Status: AC
  Filled 2016-06-22: qty 1

## 2016-06-22 SURGICAL SUPPLY — 78 items
ADAPTER IRRIG TUBE 2 SPIKE SOL (ADAPTER) ×6 IMPLANT
ADAPTER SCOPE UROLOK II (MISCELLANEOUS) ×3 IMPLANT
BAG DRAIN CYSTO-URO LG1000N (MISCELLANEOUS) ×3 IMPLANT
BAG URO DRAIN 2000ML W/SPOUT (MISCELLANEOUS) ×3 IMPLANT
BALLN NEPHROMAX 30X10X12 (MISCELLANEOUS) ×3
BALLOON NEPHROMAX 30X10X12 (MISCELLANEOUS) ×2 IMPLANT
BASKET ZERO TIP 1.9FR (BASKET) ×6 IMPLANT
BLADE SURG 15 STRL LF DISP TIS (BLADE) ×2 IMPLANT
BLADE SURG 15 STRL SS (BLADE) ×1
BRUSH SCRUB EZ 1% IODOPHOR (MISCELLANEOUS) ×3 IMPLANT
CATH COUNCIL 22FR (CATHETERS) IMPLANT
CATH FOL 2WAY LX 16X5 (CATHETERS) ×3 IMPLANT
CATH FOLEY 2W COUNCIL 20FR 5CC (CATHETERS) IMPLANT
CATH FOLEY 2W COUNCIL 5CC 18FR (CATHETERS) ×3 IMPLANT
CATH STENT KAYE NEPHR TAMP (CATHETERS) IMPLANT
CATH TRAY 16F METER LATEX (MISCELLANEOUS) ×3 IMPLANT
CATH URETL 5X70 OPEN END (CATHETERS) ×3 IMPLANT
CATH/STENT KAYE NEPHR TAMP (CATHETERS)
CHLORAPREP W/TINT 26ML (MISCELLANEOUS) ×3 IMPLANT
CNTNR SPEC 2.5X3XGRAD LEK (MISCELLANEOUS) ×2
CONRAY 43 FOR UROLOGY 50M (MISCELLANEOUS) ×6 IMPLANT
CONT SPEC 4OZ STER OR WHT (MISCELLANEOUS) ×1
CONTAINER SPEC 2.5X3XGRAD LEK (MISCELLANEOUS) ×2 IMPLANT
DRAPE C-ARM XRAY 36X54 (DRAPES) ×3 IMPLANT
DRAPE SHEET LG 3/4 BI-LAMINATE (DRAPES) ×3 IMPLANT
DRAPE SURG 17X11 SM STRL (DRAPES) ×12 IMPLANT
ELECT REM PT RETURN 9FT ADLT (ELECTROSURGICAL)
ELECTRODE REM PT RTRN 9FT ADLT (ELECTROSURGICAL) IMPLANT
GAUZE SPONGE 4X4 12PLY STRL (GAUZE/BANDAGES/DRESSINGS) ×6 IMPLANT
GLIDEWIRE STIFF .35X180X3 HYDR (WIRE) ×3 IMPLANT
GLOVE BIO SURGEON STRL SZ 6.5 (GLOVE) ×6 IMPLANT
GLOVE BIO SURGEON STRL SZ7 (GLOVE) ×6 IMPLANT
GLOVE BIOGEL PI IND STRL 6.5 (GLOVE) ×4 IMPLANT
GLOVE BIOGEL PI IND STRL 7.0 (GLOVE) ×2 IMPLANT
GLOVE BIOGEL PI INDICATOR 6.5 (GLOVE) ×2
GLOVE BIOGEL PI INDICATOR 7.0 (GLOVE) ×1
GOWN STRL REUS W/ TWL LRG LVL3 (GOWN DISPOSABLE) ×6 IMPLANT
GOWN STRL REUS W/TWL LRG LVL3 (GOWN DISPOSABLE) ×3
GUIDEWIRE GREEN .038 145CM (MISCELLANEOUS) ×3 IMPLANT
GUIDEWIRE STR ZIPWIRE 035X150 (MISCELLANEOUS) IMPLANT
GUIDEWIRE SUPER STIFF .035X180 (WIRE) IMPLANT
HOLDER FOLEY CATH W/STRAP (MISCELLANEOUS) ×3 IMPLANT
INFUSOR MANOMETER BAG 3000ML (MISCELLANEOUS) ×3 IMPLANT
INTRODUCER DILATOR DOUBLE (INTRODUCER) ×3 IMPLANT
KIT RM TURNOVER CYSTO AR (KITS) ×3 IMPLANT
MANIFOLD NEPTUNE II (INSTRUMENTS) ×3 IMPLANT
MAT BLUE FLOOR 46X72 FLO (MISCELLANEOUS) ×9 IMPLANT
NDL FASCIA INCISION 18GA (NEEDLE) ×3 IMPLANT
PACK BASIN MINOR ARMC (MISCELLANEOUS) ×3 IMPLANT
PACK CYSTO AR (MISCELLANEOUS) ×3 IMPLANT
PAD ABD DERMACEA PRESS 5X9 (GAUZE/BANDAGES/DRESSINGS) ×6 IMPLANT
PAD PREP 24X41 OB/GYN DISP (PERSONAL CARE ITEMS) ×3 IMPLANT
PROBE CYBERWAND SET (MISCELLANEOUS) ×3 IMPLANT
PUMP SINGLE ACTION SAP (PUMP) ×3 IMPLANT
SENSORWIRE 0.038 NOT ANGLED (WIRE) ×6
SET CYSTO W/LG BORE CLAMP LF (SET/KITS/TRAYS/PACK) ×3 IMPLANT
SET IRRIGATING DISP (SET/KITS/TRAYS/PACK) ×3 IMPLANT
SHEATH URETERAL 12FRX35CM (MISCELLANEOUS) ×3 IMPLANT
SHEET NEURO XL SOL CTL (MISCELLANEOUS) ×3 IMPLANT
SOL .9 NS 3000ML IRR  AL (IV SOLUTION) ×8
SOL .9 NS 3000ML IRR UROMATIC (IV SOLUTION) ×16 IMPLANT
SPONGE DRAIN TRACH 4X4 STRL 2S (GAUZE/BANDAGES/DRESSINGS) ×3 IMPLANT
STENT URET 6FRX24 CONTOUR (STENTS) IMPLANT
STENT URET 6FRX26 CONTOUR (STENTS) ×3 IMPLANT
STRAP SAFETY BODY (MISCELLANEOUS) ×6 IMPLANT
SURGILUBE 2OZ TUBE FLIPTOP (MISCELLANEOUS) ×3 IMPLANT
SUT SILK 0 SH 30 (SUTURE) ×3 IMPLANT
SYR 20CC LL (SYRINGE) ×3 IMPLANT
SYR 30ML LL (SYRINGE) ×3 IMPLANT
SYRINGE 10CC LL (SYRINGE) ×3 IMPLANT
SYRINGE IRR TOOMEY STRL 70CC (SYRINGE) ×3 IMPLANT
TAPE CLOTH 1X10 WHT NS LF (TAPE) ×3 IMPLANT
TAPE MICROFOAM 4IN (TAPE) ×3 IMPLANT
TRAP SPECIMEN MUCOUS 40CC (MISCELLANEOUS) IMPLANT
TUBING CONNECTING 10 (TUBING) ×3 IMPLANT
WATER STERILE IRR 1000ML POUR (IV SOLUTION) ×3 IMPLANT
WATER STERILE IRR 3000ML UROMA (IV SOLUTION) ×3 IMPLANT
WIRE SENSOR 0.038 NOT ANGLED (WIRE) ×4 IMPLANT

## 2016-06-22 NOTE — Anesthesia Procedure Notes (Signed)
Procedure Name: Intubation Date/Time: 06/22/2016 10:22 AM Performed by: Omer Jack Pre-anesthesia Checklist: Patient identified, Patient being monitored, Timeout performed, Emergency Drugs available and Suction available Patient Re-evaluated:Patient Re-evaluated prior to inductionOxygen Delivery Method: Circle system utilized Preoxygenation: Pre-oxygenation with 100% oxygen Intubation Type: IV induction Ventilation: Mask ventilation without difficulty Laryngoscope Size: McGraph and 4 Grade View: Grade I Tube type: Oral Tube size: 7.0 mm Number of attempts: 1 Airway Equipment and Method: Stylet Placement Confirmation: ETT inserted through vocal cords under direct vision,  positive ETCO2 and breath sounds checked- equal and bilateral Secured at: 24 cm Tube secured with: Tape Dental Injury: Teeth and Oropharynx as per pre-operative assessment

## 2016-06-22 NOTE — Progress Notes (Signed)
Patient resting post procedure. Tolerated well. Vitals have remained stable, denies complaints at this time. Ns infusing per orders. To go to [facility] for or procedure.

## 2016-06-22 NOTE — Op Note (Signed)
Date of procedure: 06/22/16  Preoperative diagnosis:  1. Right staghorn calculus 2. Right ureteral calculus 3. History of sepsis related to urinary source   Postoperative diagnosis:  1. Same as above   Procedure: 1. Right percutaneous nephrolithotomy, > 5 cm 2. Right antegrade ureteroscopy 3. Antegrade nephrostogram 4. Right ureteral stent exchange 5. Basket extraction of stone fragments 6. Interpretation of fluoroscopy less than 30 minutes  Surgeon: Vanna Scotland, MD  Anesthesia: General  Complications: None  Intraoperative findings: Right partial staghorn stone occupying three full calyces renal pelvis.  Middle branch of staghorn stone unable to be accessed today, otherwise upper branch as well as lower branch, renal pelvis, and ureter cleared of stones.  EBL: 500 cc  Specimens: Stone fragment  Drains: 6 x 26 French double-J ureteral stent on right, 18 [place] council tip Foley catheter as nephrostomy, 16 French Foley catheter  Indication: Dave Conrad is a 41 y.o. patient initially presenting with urosepsis secondary to an 12 mm surgeon ureteral calculus. He was also noted to have a partial staghorn calculus occupying the lower collecting system who underwent urgent ureteral stent placement. He was treated with antibiotics appropriately returns today for definitive management of stone.  After reviewing the management options for treatment, he elected to proceed with the above surgical procedure(s). We have discussed the potential benefits and risks of the procedure, side effects of the proposed treatment, the likelihood of the patient achieving the goals of the procedure, and any potential problems that might occur during the procedure or recuperation. Informed consent has been obtained.  Description of procedure:  The patient was taken to the operating room and general anesthesia was induced.  The patient was placed in the supineposition, and preoperative antibiotics were  administered. A preoperative time-out was performed.  A Foley catheter was placed on the stretcher. At this point in time, he was repositioned on the operating room table in the prone position with care taken to pad all pressure points. He started to the table using a safety straps. He was then prepped and draped in standard sterile fashion.  A scout imaging revealed a large partial staghorn calculus occupying the lower pole of the right kidney with 3 large branches as well as a branch extending into the renal pelvis. A mid ureteral stone, also be identified corresponding to the 12 mm stone seen on CT scan. The ureteral stent was seen in place. A nephroureteral catheter placed earlier the day by interventional radiology was also appreciated which traversed through the superior most branch of the stone extending through the renal pelvis down the ureter into the bladder. This point in time, the nephroureteral catheter was cannulated using a superstiff wire down to level of the bladder. The catheter was removed leaving the wire in place. The safety wire introducer was then used to introduce a second sensor wire down to the bladder as well without difficulty. Fascial incisor needle was then used over the wire to incise the fascia. A NephroMax balloon was then advanced to the level of the stone and inflated to 15 cm of water. This is allowed to stay in place for several minutes while the nephroscope was assembled for hemostasis. The sheath was then advanced over the balloon into the appropriate position, the balloon was deflated and removed leaving the safety wire in place. Nephroscope was brought in and the stone was immediately encountered. Cyber wand was then used to begin breaking up the stone which was relatively soft. I was able to access and  treat all of the stone in this calyx, and extreme lower pole calyx, and the renal pelvis both using the cyber wand and alligator forceps. Unfortunately, due to the acute  angulation into the middle portion of the staghorn, was not able to reach this. I tried access this calyx using both a flexible cystoscope and ureteroscope unsuccessfully. The remainder of the calyces were able to be directly visualized and free of all stones. At this point in time, digital dual-lumen flexible ureteroscope was then advanced down the ureter until a large stone was encountered. A 1.9 French to plus nitinol basket was used to easily extracted the stone given that it was quite long and skinny shape in one piece. The scope was then able to be advanced all the way down to level of the bladder and no significant stone burden was appreciated within the ureter as well as no areas of ureteral injury urinary appreciated. The decision was made at this point in time to return for a staged procedure ureteroscopically in a retrograde fashion to treat the residual stone burden.  A new 6 x 26 French double-J ureteral stent was then advanced over a wire fluoroscopically down to level of the bladder. The wire was partially withdrawn until coil was noted within the bladder. The wires and fully withdrawn and the proximal coil was noted to be looped over the lower pole calyx both on fluoroscopy as well as by direct visualization using the nephroscope. This wire was then replaced under direct visualization down the ureter again. A 18 French council tip catheter was advanced over the wire into the renal pelvis. The balloon was filled with partially 2 cc of mixed contrast solution. The wire was then withdrawn. This served as a nephrostomy tube. An antegrade nephrostogram was then performed through the nephrostomy tube which showed no extravasation and outlined the collecting system. A defect in the untreated stone calyx was appreciated. Contrast did pass easily down the entire length of the ureter down to the bladder and also confirming the position of the distal coil of the stent within the bladder itself. The safety  wire and sheath were then removed. The nephrostomy tube was sutured in place. A dressing of 4 x 4's, and an  ABD pad, and foam tape was applied. The prostate tube was left open to drainage. The patient was then repositioned in the stretcher, reversed from anesthesia, and taken to the PACU in stable conditio  Plan: Patient will be admitted overnight for observation. We will clamp his nephrostomy tube in the morning as well as remove his Foley catheter. If this is tolerated, we will remove his nephrostomy tube prior to discharge. We'll plan on returning to the operating room in approximately 4 weeks for ureteroscopic clearance of all residual stone burden.   Vanna Scotland, M.D.

## 2016-06-22 NOTE — Procedures (Signed)
Interventional Radiology Procedure Note  Procedure: Image guided right percutaneous renal access for pending nephrolithotomy.  80F access with 65cm glide cath into the bladder, adjacent to the right ureteral stent.  Catheter will accept any 035 wire.  Complications: None Recommendations:  - NPO - Plan for OR today with Dr. Apolinar Junes.     Signed,  Yvone Neu. Loreta Ave, DO

## 2016-06-22 NOTE — Anesthesia Preprocedure Evaluation (Signed)
Anesthesia Evaluation  Patient identified by MRN, date of birth, ID band Patient awake    Reviewed: Allergy & Precautions, H&P , NPO status , Patient's Chart, lab work & pertinent test results, reviewed documented beta blocker date and time   History of Anesthesia Complications Negative for: history of anesthetic complications  Airway Mallampati: III  TM Distance: >3 FB Neck ROM: full    Dental  (+) Caps, Teeth Intact Permanent bridge x 2:   Pulmonary neg shortness of breath, neg COPD, neg recent URI, Current Smoker, former smoker,  Currently on non-rebreather, sating 97%          Cardiovascular Exercise Tolerance: Good hypertension, (-) angina(-) CAD, (-) Past MI, (-) Cardiac Stents and (-) CABG (-) dysrhythmias (-) Valvular Problems/Murmurs     Neuro/Psych negative neurological ROS  negative psych ROS   GI/Hepatic negative GI ROS, Neg liver ROS,   Endo/Other  diabetesMorbid obesity  Renal/GU negative Renal ROS  negative genitourinary   Musculoskeletal   Abdominal   Peds  Hematology  (+) HIV,   Anesthesia Other Findings Past Medical History: 05/08/2016: Asplenia No date: Diabetes mellitus without complication (HCC) No date: HIV (human immunodeficiency virus infection) (* No date: Hypertension   Reproductive/Obstetrics negative OB ROS                             Anesthesia Physical  Anesthesia Plan  ASA: III and emergent  Anesthesia Plan: General   Post-op Pain Management:    Induction:   Airway Management Planned:   Additional Equipment:   Intra-op Plan:   Post-operative Plan:   Informed Consent: I have reviewed the patients History and Physical, chart, labs and discussed the procedure including the risks, benefits and alternatives for the proposed anesthesia with the patient or authorized representative who has indicated his/her understanding and acceptance.   Dental  Advisory Given  Plan Discussed with: Anesthesiologist, CRNA and Surgeon  Anesthesia Plan Comments:         Anesthesia Quick Evaluation

## 2016-06-22 NOTE — H&P (Signed)
05/21/2016  --> updated today on 06/22/16 when seen in preop holding arear.  No changes to H&P as below.  RRR. CTAB. 2:19 PM   Dave Conrad 05-16-1975 [id number]  Referring provider: [facility] [address] [place], [place] [address]      Chief Complaint  Patient presents with  . New Patient (Initial Visit)    kidney stone    HPI: 41 year old male who presents today to discuss definitive management of the stone. 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 The ureteral stent was placed emergently in the setting of Proteus bacteremia which was pansensitive and discharged with Kelfex x 14 days.    Incidentally, he was noted to have bulbar urethral stricture was noted in his Foley catheter was maintained for 3 days postop.  Since discharge, he has done quite well. He denies any flank pain or bladder irritation symptoms.  Good urinary stream without dysuria or gross hematuria.  He has no personal history of stones.  He reports that his HIV is well controlled with a low viral load.   PMH:     Past Medical History:  Diagnosis Date  . Asplenia 05/08/2016  . Diabetes mellitus without complication (HCC)   . HIV (human immunodeficiency virus infection) (HCC)   . Hypertension     Surgical History:      Past Surgical History:  Procedure Laterality Date  . CYSTOSCOPY WITH STENT PLACEMENT Right 05/09/2016   Procedure: CYSTOSCOPY WITH STENT PLACEMENT and retrorade pyelogram;  Surgeon: Crist Fat, MD;  Location: [facility] ORS;  Service: Urology;  Laterality: Right;    Home Medications:      Allergies as of 05/21/2016      Reactions   Penicillins Nausea And Vomiting   Has patient had a PCN reaction causing immediate rash, facial/tongue/throat swelling, SOB or lightheadedness with hypotension: yes rash Has  patient had a PCN reaction causing severe rash involving mucus membranes or skin necrosis:no Has patient had a PCN reaction that required hospitalization no Has patient had a PCN reaction occurring within the last 10 years: yes If all of the above answers are "NO", then may proceed with Cephalosporin use.               Medication List           Accurate as of 05/21/16 11:59 PM. Always use your most recent med list.           aspirin EC 81 MG tablet Take 81 mg by mouth daily.   cephALEXin 500 MG capsule Commonly known as:  KEFLEX Take 1 capsule (500 mg total) by mouth every 8 (eight) hours.   diltiazem 180 MG 24 hr capsule Commonly known as:  DILACOR XR Take 180 mg by mouth daily.   efavirenz-emtricitabine-tenofovir 600-200-300 MG tablet Commonly known as:  ATRIPLA Take 1 tablet by mouth at bedtime.   glipiZIDE 10 MG 24 hr tablet Commonly known as:  GLUCOTROL XL Take 5-10 mg by mouth 2 (two) times daily. Take 10 mg in the morning and 5 mg in the evening   metFORMIN 500 MG tablet Commonly known as:  GLUCOPHAGE Take 1,000 mg by mouth 2 (two) times daily with a meal.   pravastatin 20 MG tablet Commonly known as:  PRAVACHOL Take 20 mg by mouth daily.       Allergies:  Allergies  Allergen Reactions  . Penicillins Nausea And Vomiting    Has patient had a PCN reaction causing immediate rash, facial/tongue/throat swelling, SOB or lightheadedness with hypotension: yes rash Has patient had a PCN reaction causing severe rash involving mucus membranes or skin necrosis:no Has patient had a PCN reaction that required hospitalization no Has patient had a PCN reaction occurring within the last 10 years: yes If all of the above answers are "NO", then may proceed with Cephalosporin use.     Family History:      Family History  Problem Relation Age of Onset  . Kidney cancer Neg Hx   . Kidney disease Neg Hx   . Prostate cancer Neg Hx      Social History:  reports that he has quit smoking. His smoking use included Cigarettes. He has never used smokeless tobacco. He reports that he does not drink alcohol. His drug history is not on file.  ROS: UROLOGY Frequent Urination?: No Hard to postpone urination?: No Burning/pain with urination?: No Get up at night to urinate?: No Leakage of urine?: No Urine stream starts and stops?: No Trouble starting stream?: No Do you have to strain to urinate?: No Blood in urine?: No Urinary tract infection?: No Sexually transmitted disease?: No Injury to kidneys or bladder?: No Painful intercourse?: No Weak stream?: No Erection problems?: No Penile pain?: No  Gastrointestinal Nausea?: No Vomiting?: No Indigestion/heartburn?: No Diarrhea?: No Constipation?: No  Constitutional Fever: No Night sweats?: No Weight loss?: No Fatigue?: No  Skin Skin rash/lesions?: No Itching?: No  Eyes Blurred vision?: No Double vision?: No  Ears/Nose/Throat Sore throat?: No Sinus problems?: No  Hematologic/Lymphatic Swollen glands?: No Easy bruising?: No  Cardiovascular Leg swelling?: No Chest pain?: No  Respiratory Cough?: No Shortness of breath?: No  Endocrine Excessive thirst?: No  Musculoskeletal Back pain?: No Joint pain?: No  Neurological Headaches?: No Dizziness?: No  Psychologic Depression?: No Anxiety?: No  Physical Exam: BP (!) 150/102   Pulse (!) 101   Ht  (1.702 m)   Wt 257 lb 12.8 oz (116.9 kg)   BMI 40.38 kg/m   Constitutional:  Alert and oriented, No acute distress. HEENT: [place] AT, moist mucus membranes.  Trachea midline, no masses. Cardiovascular: No clubbing, cyanosis, or edema. Respiratory: Normal respiratory effort, no increased work of breathing. GI: Abdomen is soft, nontender, nondistended, no abdominal masses GU: No CVA tenderness.  Skin: No rashes, bruises or suspicious lesions. Neurologic: Grossly intact, no  focal deficits, moving all 4 extremities. Psychiatric: Normal mood and affect.  Laboratory Data: RecentLabs       Lab Results  Component Value Date   WBC 18.9 (H) 05/12/2016   WBC 14.7 (H) 05/12/2016   HGB 11.9 (L) 05/12/2016   HCT 34.8 (L) 05/12/2016   HCT 25.2 (L) 05/12/2016   MCV 79.2 (L) 05/12/2016   MCV 81 05/12/2016   PLT 240 05/12/2016   PLT 339 05/12/2016      RecentLabs  Lab Results  Component Value Date   CREATININE 1.07 05/12/2016      RecentLabs       Lab Results  Component Value Date   HGBA1C 7.5 (H) 05/08/2016      Urinalysis Labs(Brief)          Component Value Date/Time   COLORURINE AMBER (A) 05/07/2016 1259   APPEARANCEUR Clear 05/21/2016 1120   LABSPEC 1.033 (H) 05/07/2016 1259   PHURINE 5.0 05/07/2016 1259   GLUCOSEU 3+ (A) 05/21/2016 1120  HGBUR MODERATE (A) 05/07/2016 1259   BILIRUBINUR Negative 05/21/2016 1120   KETONESUR 80 (A) 05/07/2016 1259   PROTEINUR Negative 05/21/2016 1120   PROTEINUR 100 (A) 05/07/2016 1259   NITRITE Negative 05/21/2016 1120   NITRITE NEGATIVE 05/07/2016 1259   LEUKOCYTESUR Trace (A) 05/21/2016 1120      Pertinent Imaging: CLINICAL DATA: 42 year old male with dyspnea, nausea, vomiting and diarrhea for the past several days  EXAM: CT CHEST, ABDOMEN AND PELVIS WITHOUT CONTRAST  TECHNIQUE: Multidetector CT imaging of the chest, abdomen and pelvis was performed following the standard protocol without IV contrast.  COMPARISON: None.  FINDINGS: CT CHEST FINDINGS  Cardiovascular: Limited evaluation in the absence of intravenous contrast. The heart is normal in size. No aortic aneurysm. Small volume pericardial fluid.  Mediastinum/Nodes: Unremarkable CT appearance of the thyroid gland. No suspicious mediastinal or hilar adenopathy. No soft tissue mediastinal mass. The thoracic esophagus is unremarkable.  Lungs/Pleura: Bilateral ground-glass attenuation  airspace opacity and consolidation throughout the lungs bilaterally in a peribronchovascular distribution. Findings are most consistent with a multifocal infectious/ inflammatory process. No interlobular septal thickening, pleural effusion, pneumothorax or nodularity.  Musculoskeletal: No acute fracture or aggressive appearing lytic or blastic osseous lesion.  CT ABDOMEN PELVIS FINDINGS  Hepatobiliary: No focal liver abnormality is seen. No gallstones, gallbladder wall thickening, or biliary dilatation.  Pancreas: Unremarkable. No pancreatic ductal dilatation or surrounding inflammatory changes.  Spleen: Normal in size without focal abnormality.  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 There is moderate hydronephrosis in the upper and interpolar regions. At least partially obstructing 7 x 12 mm calculus in the right mid to upper ureter. There is associated is right renal edema and mild perinephric stranding. No stones present in the left kidney. The adrenal glands are normal. Unremarkable bladder.  Stomach/Bowel: No evidence of obstruction or focal bowel wall thickening. Normal appendix in the right lower quadrant. The terminal ileum is unremarkable.  Vascular/Lymphatic: No significant vascular findings are present. No enlarged abdominal or pelvic lymph nodes.  Reproductive: Prostate is unremarkable.  Other: Small fat containing umbilical hernia.  Musculoskeletal: No acute fracture or aggressive appearing lytic or blastic osseous lesion.  IMPRESSION: CT CHEST  1. Consolidative changes and ground-glass attenuation airspace opacities in a peribronchovascular distribution throughout all lobes of both lungs. The imaging appearance is most consistent with a multi lobar infectious/inflammatory process such as multi lobar pneumonia, an atypical or viral respiratory illness, or  acute pneumonitis. 2. Small pericardial effusion. CT ABD/PELVIS  1. Obstructing 7 x 12 mm stone in the right proximal to mid ureter with resultant moderate right-sided hydronephrosis, renal edema and perinephric stranding. 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 2. Small fat containing umbilical hernia.   Electronically Signed By: Malachy Moan M.D. On: 05/09/2016 13:42  CT scan personally reviewed today and with the patient.  Assessment & Plan:    1. Right nephrolithiasis Large right partial staghorn calculus with 12 mm mid ureteral calculus status post emergent ureteral stent in the setting of Proteus bacteremia.  He returned to the office today discuss definitive management of his stones. I recommended pursuing percutaneous nephrolithotomy with antegrade ureteroscopy to address his renal stone burden as well as ureteral stone burden.  Risks of the procedure were reviewed today in detail including risk of bleeding, infection, damage to surrounding structures, need for possible blood transfusion, sepsis, need for multiple procedures amongst others. All questions were answered  in detail. He understands the procedure or typical routine postop course. All of his questions were answered today.  - Urinalysis, Complete - CULTURE, URINE COMPREHENSIVE  2. Proteus septicemia (HCC) Suspect stone likely struvite stone in the setting of previous Proteus bacteremia. We will cover him with double antibiotics with time of the procedure. He understands the risk of recurrent sepsis given possible infected stone.    3. Postinfective stricture of bulbous urethra Discussed today that he will need a Foley catheter during the procedure. If necessary, we may need to dilate his bulbar urethra again.  In this case, he would require a longer duration Foley and risk and benefits were reviewed.    Schedule  right  PCNL  Vanna Scotland, MD  [facility] [address] [place], [place] [address] [phone]  I spent 25 min with this patient of which greater than 50% was spent in counseling and coordination of care with the patient.

## 2016-06-22 NOTE — Anesthesia Post-op Follow-up Note (Cosign Needed)
Anesthesia QCDR form completed.        

## 2016-06-22 NOTE — Progress Notes (Signed)
Transferred to [facility] recovery room 8 via stretcher.  Report given to [facility].

## 2016-06-22 NOTE — Consult Note (Signed)
Chief Complaint: Kidney stone  Referring Physician(s): Dr. Vanna Scotland, Urology  Supervising Physician: Gilmer Mor  Patient Status: [facility] - In-pt  History of Present Illness: Dave Conrad is a 41 y.o. male presenting for urologic surgery, for treatment of right kidney stone contributing to Proteus infection.   He has been referred today for evaluation for image guided right percutaneous access for possible percutaneous nephrolithotomy.    He has been previously treated for Proteus infection.    Today he denies any fevers/rigors/chills.    We discussed the indication for the access, and the risks/benefits.  Risks include: bleeding, infection, kidney injury, organ injury, sepsis, cardiopulmonary collapse, death.     Past Medical History:  Diagnosis Date  . Asplenia 05/08/2016  . Diabetes mellitus without complication (HCC)   . H/O sepsis 05/06/2016   due to right kidney stone  . History of kidney stones   . HIV (human immunodeficiency virus infection) (HCC)   . Hypertension     Past Surgical History:  Procedure Laterality Date  . CYSTOSCOPY WITH STENT PLACEMENT Right 05/09/2016   Procedure: CYSTOSCOPY WITH STENT PLACEMENT and retrorade pyelogram;  Surgeon: Crist Fat, MD;  Location: [facility] ORS;  Service: Urology;  Laterality: Right;  . SPLENECTOMY, TOTAL  1982    Allergies: Shellfish allergy and Penicillins  Medications: Prior to Admission medications   Medication Sig Start Date End Date Taking? Authorizing Provider  aspirin EC 81 MG tablet Take 81 mg by mouth every evening.    Yes Historical Provider, MD  diltiazem (DILACOR XR) 180 MG 24 hr capsule Take 180 mg by mouth daily.   Yes Historical Provider, MD  efavirenz-emtricitabine-tenofovir (ATRIPLA) 600-200-300 MG tablet Take 1 tablet by mouth at bedtime.   Yes Historical Provider, MD  glipiZIDE (GLUCOTROL XL) 10 MG 24 hr tablet Take 5-10 mg by mouth 2 (two) times daily. Take 10 mg in the morning and 5  mg in the evening   Yes Historical Provider, MD  lisinopril (PRINIVIL,ZESTRIL) 40 MG tablet Take 40 mg by mouth daily.  05/26/16 05/26/17 Yes Historical Provider, MD  metFORMIN (GLUCOPHAGE) 500 MG tablet Take 1,000 mg by mouth 2 (two) times daily with a meal.   Yes Historical Provider, MD  Multiple Vitamins-Minerals (CENTRUM ULTRA MENS PO) Take 1 tablet by mouth daily.   Yes Historical Provider, MD  pravastatin (PRAVACHOL) 20 MG tablet Take 20 mg by mouth every evening.    Yes Historical Provider, MD  Saw Palmetto, Serenoa repens, (SAW PALMETTO PO) Take 2 tablets by mouth 2 (two) times daily.   Yes Historical Provider, MD  cephALEXin (KEFLEX) 500 MG capsule Take 1 capsule (500 mg total) by mouth every 8 (eight) hours. Patient not taking: Reported on 05/21/2016 05/12/16   Alford Highland, MD     Family History  Problem Relation Age of Onset  . Breast cancer Mother   . Kidney cancer Neg Hx   . Kidney disease Neg Hx   . Prostate cancer Neg Hx     Social History   Social History  . Marital status: Married    Spouse name: N/A  . Number of children: N/A  . Years of education: N/A   Social History Main Topics  . Smoking status: Former Smoker    Packs/day: 0.25    Types: Cigarettes  . Smokeless tobacco: Never Used  . Alcohol use No  . Drug use: No  . Sexual activity: No   Other Topics Concern  . None   Social  History Narrative  . None       Review of Systems: A 12 point ROS discussed and pertinent positives are indicated in the HPI above.  All other systems are negative.  Review of Systems  Vital Signs: BP (!) 165/107   Pulse 86   Temp 98.6 F (37 C) (Tympanic)   Resp 16   Ht  (1.676 m)   Wt 260 lb (117.9 kg)   SpO2 100%   BMI 41.97 kg/m   Physical Exam  General: 41 yo male appearing   stated age.  Well-developed, well-nourished.  No distress. HEENT: Atraumatic, normocephalic.  Conjugate gaze, extra-ocular motor intact. No scleral icterus or scleral injection. No  lesions on external ears, nose, lips, or gums.  Oral mucosa moist, pink.  Neck: Symmetric with no goiter enlargement.  Chest/Lungs:  Symmetric chest with inspiration/expiration.  No labored breathing.  Clear to auscultation with no wheezes, rhonchi, or rales.  Heart:  RRR, with no third heart sounds appreciated. No JVD appreciated.  Abdomen:  Soft, NT/ND, with + bowel sounds.   Genito-urinary: Deferred Neurologic: Alert & Oriented to person, place, and time.   Normal affect and insight.  Appropriate questions.  Moving all 4 extremities with gross sensory intact.   .  Mallampati Score:  2  Imaging: No results found.  Labs:  CBC:  Recent Labs  05/10/16 0418 05/12/16 0448 06/08/16 0912 06/22/16 0624  WBC 14.2* 14.7*  18.9* 8.4 7.5  HGB 11.6* 11.9* 11.7* 12.8*  HCT 34.2* 34.8*  25.2* 35.0* 37.9*  PLT 165 240  339 287 231    COAGS:  Recent Labs  05/08/16 0123 05/09/16 1956 06/08/16 0912 06/22/16 0624  INR 1.24 1.21 0.99 1.02  APTT 29  --  27 27    BMP:  Recent Labs  05/10/16 0418 05/12/16 0448 06/08/16 0912 06/22/16 0624  NA 132* 133* 139 140  K 4.2 3.3* 3.4* 3.3*  CL 98* 100* 103 104  CO2 GLUCOSE 325* 151* 128* 163*  BUN 27* 22* 12 13  CALCIUM 8.0* 8.3* 9.1 9.1  CREATININE 1.62* 1.07 0.79 0.91  GFRNONAA 52* >60 >60 >60  GFRAA >60 >60 >60 >60    LIVER FUNCTION TESTS:  Recent Labs  05/07/16 1259 05/08/16 0123 05/08/16 1640  BILITOT 1.3* 2.4* 3.2*  AST 21 46* 45*  ALT 22 30 32  ALKPHOS 81 145* 111  PROT 8.1 7.8 7.3  ALBUMIN 3.9 3.6 3.3*    TUMOR MARKERS: No results for input(s): AFPTM, CEA, CA199, CHROMGRNA in the last 8760 hours.  Assessment and Plan:  [name] is 41 yo male presenting with right staghorn calculus and treated proteus infection.    Today the plan is for right sided image guided access for nephrolithotomy.    Thank you for this interesting consult.  I greatly enjoyed meeting Santhiago Fitzner and look forward  to participating in their care.  A copy of this report was sent to the requesting provider on this date.  Electronically Signed: Gilmer Mor 06/22/2016, 8:41 AM   I spent a total of 20 Minutes    in face to face in clinical consultation, greater than 50% of which was counseling/coordinating care for right staghorn calculus, percutaneous kidney access.

## 2016-06-22 NOTE — Transfer of Care (Signed)
Immediate Anesthesia Transfer of Care Note  Patient: Dave Conrad  Procedure(s) Performed: Procedure(s): NEPHROLITHOTOMY PERCUTANEOUS (Right) ANTEGRADE URETEROSCOPY (Right) CYSTOSCOPY WITH URETHRAL DILATATION (N/A) HOLMIUM LASER APPLICATION (N/A)  Patient Location: PACU  Anesthesia Type:General  Level of Consciousness: sedated and responds to stimulation  Airway & Oxygen Therapy: Patient Spontanous Breathing and Patient connected to face mask oxygen  Post-op Assessment: Report given to RN and Post -op Vital signs reviewed and stable  Post vital signs: Reviewed and stable  Last Vitals:  Vitals:   06/22/16 0935 06/22/16 1341  BP: (!) 148/94 129/80  Pulse: 84 92  Resp: 16 18  Temp:      Last Pain:  Vitals:   06/22/16 0627  TempSrc: Tympanic         Complications: No anesthetic complications

## 2016-06-23 DIAGNOSIS — N35112 Postinfective bulbous urethral stricture, not elsewhere classified: Secondary | ICD-10-CM

## 2016-06-23 DIAGNOSIS — N200 Calculus of kidney: Secondary | ICD-10-CM | POA: Diagnosis not present

## 2016-06-23 DIAGNOSIS — Z419 Encounter for procedure for purposes other than remedying health state, unspecified: Secondary | ICD-10-CM | POA: Diagnosis not present

## 2016-06-23 DIAGNOSIS — N202 Calculus of kidney with calculus of ureter: Secondary | ICD-10-CM | POA: Diagnosis not present

## 2016-06-23 LAB — BASIC METABOLIC PANEL
Anion gap: 7 (ref 5–15)
BUN: 11 mg/dL (ref 6–20)
CHLORIDE: 108 mmol/L (ref 101–111)
CO2: 25 mmol/L (ref 22–32)
CREATININE: 0.95 mg/dL (ref 0.61–1.24)
Calcium: 8.4 mg/dL — ABNORMAL LOW (ref 8.9–10.3)
GFR calc Af Amer: >60 mL/min (ref >60)
Glucose, Bld: 111 mg/dL — ABNORMAL HIGH (ref 65–99)
POTASSIUM: 3.2 mmol/L — AB (ref 3.5–5.1)
SODIUM: 140 mmol/L (ref 135–145)

## 2016-06-23 LAB — CBC
HCT: 33.1 % — ABNORMAL LOW (ref 40.0–52.0)
HEMOGLOBIN: 10.9 g/dL — AB (ref 13.0–18.0)
MCH: 25.8 pg — ABNORMAL LOW (ref 26.0–34.0)
MCHC: 33 g/dL (ref 32.0–36.0)
MCV: 78.2 fL — ABNORMAL LOW (ref 80.0–100.0)
PLATELETS: 184 10*3/uL (ref 150–440)
RBC: 4.23 MIL/uL — AB (ref 4.40–5.90)
RDW: 15.4 % — ABNORMAL HIGH (ref 11.5–14.5)
WBC: 12.2 10*3/uL — ABNORMAL HIGH (ref 3.8–10.6)

## 2016-06-23 MED ORDER — OXYBUTYNIN CHLORIDE 5 MG PO TABS: 5.0000 mg | ORAL_TABLET | Freq: Three times a day (TID) | ORAL | 0 refills | Status: DC | PRN

## 2016-06-23 MED ORDER — DOCUSATE SODIUM 100 MG PO CAPS: 100.0000 mg | ORAL_CAPSULE | Freq: Two times a day (BID) | ORAL | 0 refills | Status: DC

## 2016-06-23 MED ORDER — OXYCODONE-ACETAMINOPHEN 5-325 MG PO TABS: 1.0000 | ORAL_TABLET | ORAL | 0 refills | Status: DC | PRN

## 2016-06-23 NOTE — Discharge Summary (Signed)
Date of admission: 06/22/2016  Date of discharge: 06/23/2016  Admission diagnosis: 1. Right staghorn calculus       2. Right ureteral calculus       3. History of sepsis related to urinary source  Discharge diagnosis: Same as above  Secondary diagnoses:  Patient Active Problem List   Diagnosis Date Noted  . Right kidney stone 06/22/2016  . Malnutrition of moderate degree 05/09/2016  . Asplenia 05/08/2016  . Sepsis ([place]) 05/07/2016    History and Physical: For full details, please see admission history and physical. Briefly, Dave Conrad is a 41 y.o. year old patient with a right partial staghorn stone occupying three full calyces of the renal pelvis and a right ureteral stone who underwent right PCNL on 06/22/2016.    Hospital Course: Patient tolerated the procedure well.  He was then transferred to the floor after an uneventful PACU stay.  His hospital course was uncomplicated.  On POD#1  he had met discharge criteria: was eating a regular diet, was up and ambulating independently,  pain was well controlled, was voiding without a catheter, tolerating clamped nephrostomy tube and was ready to for discharge.  Patient will need to return for a staged procedure ureteroscopically in a retrograde fashion to treat the residual stone burden.   Laboratory values:   Recent Labs  06/22/16 0624 06/23/16 0436  WBC 7.5 12.2*  HGB 12.8* 10.9*  HCT 37.9* 33.1*    Recent Labs  06/22/16 0624 06/23/16 0436  NA 140 140  K 3.3* 3.2*  CL 104 108  CO2 27 25  GLUCOSE 163* 111*  BUN 13 11  CREATININE 0.91 0.95  CALCIUM 9.1 8.4*    Recent Labs  06/22/16 0624  INR 1.02   No results for input(s): LABURIN in the last 72 hours. Results for orders placed or performed in visit on 05/21/16  Microscopic Examination     Status: Abnormal   Collection Time: 05/21/16 11:20 AM  Result Value Ref Range Status   WBC, UA 0-5 0 - 5 /hpf Final   RBC, UA None seen 0 - 2 /hpf Final   Epithelial Cells  (non renal) None seen 0 - 10 /hpf Final   Mucus, UA Present (A) Not Estab. Final   Bacteria, UA None seen None seen/Few Final  CULTURE, URINE COMPREHENSIVE     Status: None   Collection Time: 05/21/16 11:28 AM  Result Value Ref Range Status   Urine Culture, Comprehensive Final report  Final   Result 1 Comment  Final    Comment: No growth in 36 - 48 hours.   Physical Exam Constitutional: Well nourished. Alert and oriented, No acute distress. HEENT: [place] AT, moist mucus membranes. Trachea midline, no masses. Cardiovascular: No clubbing, cyanosis, or edema. Respiratory: Normal respiratory effort, no increased work of breathing. GI: Abdomen is soft, non tender, non distended, no abdominal masses. Liver and spleen not palpable.  No hernias appreciated.  Stool sample for occult testing is not indicated.   GU: No CVA tenderness.  No bladder fullness or masses.  Incision is clean.  Dressing of 4 x 4's and ABD was applied.   Skin: No rashes, bruises or suspicious lesions. Lymph: No cervical or inguinal adenopathy. Neurologic: Grossly intact, no focal deficits, moving all 4 extremities. Psychiatric: Normal mood and affect.  Disposition: Foley and nephrostomy tube are removed and patient is discharged to home  Discharge instruction: The patient was instructed to be ambulatory but told to refrain from heavy lifting, strenuous  activity, or driving.   Discharge medications: Allergies as of 06/23/2016      Reactions   Shellfish Allergy Anaphylaxis   Penicillins Nausea And Vomiting   Has patient had a PCN reaction causing immediate rash, facial/tongue/throat swelling, SOB or lightheadedness with hypotension: yes rash Has patient had a PCN reaction causing severe rash involving mucus membranes or skin necrosis:no Has patient had a PCN reaction that required hospitalization no Has patient had a PCN reaction occurring within the last 10 years: yes If all of the above answers are "NO", then may proceed  with Cephalosporin use.      Medication List    TAKE these medications   aspirin EC 81 MG tablet Take 81 mg by mouth every evening.   CENTRUM ULTRA MENS PO Take 1 tablet by mouth daily.   cephALEXin 500 MG capsule Commonly known as:  KEFLEX Take 1 capsule (500 mg total) by mouth every 8 (eight) hours.   diltiazem 180 MG 24 hr capsule Commonly known as:  DILACOR XR Take 180 mg by mouth daily.   docusate sodium 100 MG capsule Commonly known as:  COLACE Take 1 capsule (100 mg total) by mouth 2 (two) times daily.   efavirenz-emtricitabine-tenofovir 600-200-300 MG tablet Commonly known as:  ATRIPLA Take 1 tablet by mouth at bedtime.   glipiZIDE 10 MG 24 hr tablet Commonly known as:  GLUCOTROL XL Take 5-10 mg by mouth 2 (two) times daily. Take 10 mg in the morning and 5 mg in the evening   lisinopril 40 MG tablet Commonly known as:  PRINIVIL,ZESTRIL Take 40 mg by mouth daily.   metFORMIN 500 MG tablet Commonly known as:  GLUCOPHAGE Take 1,000 mg by mouth 2 (two) times daily with a meal.   oxybutynin 5 MG tablet Commonly known as:  DITROPAN Take 1 tablet (5 mg total) by mouth every 8 (eight) hours as needed for bladder spasms.   oxyCODONE-acetaminophen 5-325 MG tablet Commonly known as:  PERCOCET/ROXICET Take 1-2 tablets by mouth every 4 (four) hours as needed for moderate pain.   pravastatin 20 MG tablet Commonly known as:  PRAVACHOL Take 20 mg by mouth every evening.   SAW PALMETTO PO Take 2 tablets by mouth 2 (two) times daily.       Followup:  Follow-up Information    Hollice Espy, MD. Schedule an appointment as soon as possible for a visit in 1 week.   Specialty:  Urology Contact information: [address] [place] [phone]

## 2016-06-23 NOTE — Progress Notes (Signed)
Urology Consult Follow Up  Subjective: POD # 1 Patient resting comfortably in bed. He's had no complaints the night. Vital signs are stable and he is afebrile. Urine output is good. The nephrostomy tube draining scant amount of red tinged urine.  Cr 0.95.    Anti-infectives: Anti-infectives    Start     Dose/Rate Route Frequency Ordered Stop   06/22/16 2200  efavirenz-emtricitabine-tenofovir (ATRIPLA) 600-200-300 MG per tablet 1 tablet     1 tablet Oral Daily at bedtime 06/22/16 1517     06/22/16 0727  sodium chloride 0.9 % with ampicillin (OMNIPEN) ADS Med    Comments:  Carma Lair: cabinet override      06/22/16 0727 06/22/16 1929   06/22/16 0615  gentamicin (GARAMYCIN) 430 mg in dextrose 5 % 100 mL IVPB     430 mg 110.8 mL/hr over 60 Minutes Intravenous  Once 06/22/16 0604 06/22/16 1029   06/22/16 0611  ciprofloxacin (CIPRO) 400 MG/200ML IVPB    Comments:  Machia, Mill[place]: cabinet override      06/22/16 0611 06/22/16 1814   06/22/16 0604  ampicillin (OMNIPEN) 1 g in sodium chloride 0.9 % 50 mL IVPB  Status:  Discontinued     1 g 150 mL/hr over 20 Minutes Intravenous 30 min pre-op 06/22/16 0604 06/22/16 1515      Current Facility-Administered Medications  Medication Dose Route Frequency Provider Last Rate Last Dose  . 0.9 %  sodium chloride infusion   Intravenous Continuous Vanna Scotland, MD 125 mL/hr at 06/22/16 1542    . acetaminophen (TYLENOL) tablet 650 mg  650 mg Oral Q4H PRN Vanna Scotland, MD      . diltiazem (CARDIZEM CD) 24 hr capsule 180 mg  180 mg Oral Daily Vanna Scotland, MD      . diphenhydrAMINE (BENADRYL) injection 12.5 mg  12.5 mg Intravenous Q6H PRN Vanna Scotland, MD       Or  . diphenhydrAMINE (BENADRYL) 12.5 MG/5ML elixir 12.5 mg  12.5 mg Oral Q6H PRN Vanna Scotland, MD      . docusate sodium (COLACE) capsule 100 mg  100 mg Oral BID Vanna Scotland, MD      . efavirenz-emtricitabine-tenofovir (ATRIPLA) 600-200-300 MG per tablet 1 tablet  1 tablet Oral  QHS Vanna Scotland, MD   1 tablet at 06/22/16 2159  . glipiZIDE (GLUCOTROL XL) 24 hr tablet 10 mg  10 mg Oral Q breakfast Vanna Scotland, MD   10 mg at 06/23/16 0750  . glipiZIDE (GLUCOTROL XL) 24 hr tablet 5 mg  5 mg Oral Q supper Vanna Scotland, MD   5 mg at 06/22/16 1634  . lisinopril (PRINIVIL,ZESTRIL) tablet 40 mg  40 mg Oral Daily Vanna Scotland, MD   40 mg at 06/22/16 1634  . metFORMIN (GLUCOPHAGE) tablet 1,000 mg  1,000 mg Oral BID WC Vanna Scotland, MD   1,000 mg at 06/23/16 0750  . morphine 2 MG/ML injection 2-4 mg  2-4 mg Intravenous Q2H PRN Vanna Scotland, MD      . ondansetron [facility]) injection 4 mg  4 mg Intravenous Q4H PRN Vanna Scotland, MD      . opium-belladonna (B&O SUPPRETTES) 16.2-60 MG suppository 1 suppository  1 suppository Rectal Q6H PRN Vanna Scotland, MD      . oxybutynin (DITROPAN) tablet 5 mg  5 mg Oral Q8H PRN Vanna Scotland, MD      . oxyCODONE-acetaminophen (PERCOCET/ROXICET) 5-325 MG per tablet 1-2 tablet  1-2 tablet Oral Q4H PRN Vanna Scotland, MD      .  pravastatin (PRAVACHOL) tablet 20 mg  20 mg Oral QPM Vanna Scotland, MD   20 mg at 06/22/16 1635     Objective: Vital signs in last 24 hours: Temp:  [97.9 F (36.6 C)-98.7 F (37.1 C)] 98.6 F (37 C) (05/01 0456) Pulse Rate:  [79-105] 92 (05/01 0456) Resp:  [10-27] 20 (05/01 0456) BP: (105-180)/(59-101) 120/73 (05/01 0456) SpO2:  [97 %-100 %] 99 % (05/01 0456)  Intake/Output from previous day: 04/30 0701 - 05/01 0700 In: 2602.5 [P.O.:480; I.V.:2122.5] Out: 4320 [Urine:4220; Blood:100] Intake/Output this shift: No intake/output data recorded.   Physical Exam Constitutional: Well nourished. Alert and oriented, No acute distress. HEENT: [place] AT, moist mucus membranes. Trachea midline, no masses. Cardiovascular: No clubbing, cyanosis, or edema. Respiratory: Normal respiratory effort, no increased work of breathing. GI: Abdomen is soft, non tender, non distended, no abdominal masses. Liver and  spleen not palpable.  No hernias appreciated.  Stool sample for occult testing is not indicated.   GU: No CVA tenderness.  No bladder fullness or masses.  Nephrostomy dressing is clean and dry. Patient with circumcised phallus. Foley in place. Draining yellow clear urine. Rectal: Not performed Skin: No rashes, bruises or suspicious lesions. Lymph: No cervical or inguinal adenopathy. Neurologic: Grossly intact, no focal deficits, moving all 4 extremities. Psychiatric: Normal mood and affect.  Lab Results:   Recent Labs  06/22/16 0624 06/23/16 0436  WBC 7.5 12.2*  HGB 12.8* 10.9*  HCT 37.9* 33.1*  PLT 231 184   BMET  Recent Labs  06/22/16 0624 06/23/16 0436  NA 140 140  K 3.3* 3.2*  CL 104 108  CO2 27 25  GLUCOSE 163* 111*  BUN 13 11  CREATININE 0.91 0.95  CALCIUM 9.1 8.4*   PT/INR  Recent Labs  06/22/16 0624  LABPROT 13.4  INR 1.02   ABG No results for input(s): PHART, HCO3 in the last 72 hours.  Invalid input(s): PCO2, PO2  Studies/Results: Dg Abd 1 View  Result Date: 06/22/2016 CLINICAL DATA:  41 year old male with a history of right-sided staghorn calculus EXAM: DG C-ARM 61-120 MIN; ABDOMEN - 1 VIEW COMPARISON:  06/22/2016, CT 05/09/2016 FINDINGS: Multiple limited intraoperative fluoroscopic spot images during percutaneous nephrolithotomy. Images demonstrate distal end of right-sided ureteral stent projecting over the urinary bladder. Images demonstrate partial opacification of the right collecting system and right ureter during the nephrolithotomy. IMPRESSION: Limited images during right-sided percutaneous nephrolithotomy. Please refer to the dictated operative report for full details of intraoperative findings and procedure. Signed, Yvone Neu. Loreta Ave, DO [facility] Rad[facility] Electronically Signed   By: Gilmer Mor D.O.   On: 06/22/2016 13:59   Dg C-arm 1-60 Min  Result Date: 06/22/2016 CLINICAL DATA:   41 year old male with a history of right-sided staghorn calculus EXAM: DG C-ARM 61-120 MIN; ABDOMEN - 1 VIEW COMPARISON:  06/22/2016, CT 05/09/2016 FINDINGS: Multiple limited intraoperative fluoroscopic spot images during percutaneous nephrolithotomy. Images demonstrate distal end of right-sided ureteral stent projecting over the urinary bladder. Images demonstrate partial opacification of the right collecting system and right ureter during the nephrolithotomy. IMPRESSION: Limited images during right-sided percutaneous nephrolithotomy. Please refer to the dictated operative report for full details of intraoperative findings and procedure. Signed, Yvone Neu. Loreta Ave, DO [facility] Radiology Electronically Signed   By: Gilmer Mor D.O.   On: 06/22/2016 13:59   Ir Nephrostomy Placement Right  Result Date: 06/22/2016 INDICATION: 41 year old male with a history of right-sided staghorn calculus. EXAM: IMAGE GUIDED  PERCUTANEOUS ACCESS OF THE RIGHT COLLECTING SYSTEM COMPARISON:  None. MEDICATIONS: Ciprofloxacin 500 mg IV; The antibiotic was administered in an appropriate time frame prior to skin puncture. ANESTHESIA/SEDATION: Fentanyl 100 mcg IV; Versed 2.0 mg IV Moderate Sedation Time:  18 minutes The patient was continuously monitored during the procedure by the interventional radiology nurse under my direct supervision. CONTRAST:  15 cc - administered into the collecting system(s) FLUOROSCOPY TIME:  Fluoroscopy Time: 3 minutes 42 seconds (2,692 micro Gy). COMPLICATIONS: None PROCEDURE: Informed written consent was obtained from the patient after a thorough discussion of the procedural risks, benefits and alternatives. All questions were addressed. Maximal Sterile Barrier Technique was utilized including caps, mask, sterile gowns, sterile gloves, sterile drape, hand hygiene and skin antiseptic. A timeout was performed prior to the initiation of the procedure. Patient  positioned prone position on the fluoroscopy table. Ultrasound survey of the right flank was performed with images stored and sent to PACs. The patient was then prepped and draped in the usual sterile fashion. 1% lidocaine was used to anesthetize the skin and subcutaneous tissues for local anesthesia. A Chiba needle was then used to access the collecting system under fluoroscopic guidance. Small amount contrast was infused in attempt to opacified the collecting system. The interpolar posterior projecting calyx/stone was then targeted between the eleventh and twelfth ribs, as previously discussed with the referring Urology team. Fluoroscopic guidance was used to access this calyx with a 21 gauge 20 cm Chiba needle. Once we confirmed the tip of the needle on the stone, contrast was infused, partially opacifying the collecting system. Micro wire was advanced into the ureter. Small incision was made on the needle and the needle was removed from the wire peer An Accustick system was then advanced over the wire into the collecting system under fluoroscopy. The metal stiffener and inner dilator were removed, and then a sample of fluid was aspirated through the 4 French outer sheath. Bentson wire was passed into the ureter and the sheath removed. Ten French dilation of the soft tissues was performed. Five French glide cath was advanced over the Bentson wire into the ureter. Wire was removed. Small contrast confirmed location in the ureter. Glidewire was then used to advance the catheter into the urinary bladder. The 65 cm 5 French catheter was held at the skin surface. Wire was removed. Contrast confirmed location. Catheter was sutured at the skin. Patient tolerated the procedure well and remained hemodynamically stable throughout. No complications were encountered and no significant blood loss encountered IMPRESSION: Status post image guided access of the right collecting system for pending percutaneous nephrolithotomy. 65  cm 5 French catheter was passed to the urinary bladder and sutured at the skin surface. Signed, Yvone Neu. Loreta Ave, DO [facility] Radiology Electronically Signed   By: Gilmer Mor D.O.   On: 06/22/2016 09:17     Assessment & Plan Patient is status post right percutaneous nephrolithotomy for a large right partial staghorn calculus and a 12 mm midureteral calculus with right ureteral stent exchange.  1. Right nephrolithiasis  - s/p PCNL yesterday with clearing of upper branch and lower branch of staghorn stone  - Middle branch of the staghorn stone unable to be accessed during this procedure will need to return for a staged procedure ureteroscopically in a retrograde fashion to treat the residual stone burden  - Right ureteral stent in place  - Nephrostomy tube is clamped this a.m.-if patient tolerates this, tube will be removed this afternoon  and he will be ready for discharge  - Patient to follow-up in office for further discussion on his next procedure  - Foley removed today  - script for pain medication in chart  2. Right ureteral stone  - s/p successful stone basketing - stones sent for analysis  - ureteral stent in place     LOS: 0 days    [facility] 06/23/2016

## 2016-06-23 NOTE — Progress Notes (Signed)
Inpatient Diabetes Program Recommendations  AACE/ADA: New Consensus Statement on Inpatient Glycemic Control (2015)  Target Ranges:  Prepandial:   less than 140 mg/dL      Peak postprandial:   less than 180 mg/dL (1-2 hours)      Critically ill patients:  140 - 180 mg/dL   Lab Results  Component Value Date   GLUCAP 257 (H) 06/22/2016   HGBA1C 7.5 (H) 05/08/2016    Review of Glycemic Control  Results for Dave Conrad, Dave Conrad (MRN [id number]) as of 06/23/2016 10:51  Ref. Range 06/22/2016 06:36 06/22/2016 13:51 06/22/2016 17:01  Glucose-Capillary Latest Ref Range: 65 - 99 mg/dL 409 (H) 811 (H) 914 (H)    Diabetes history: Type 2 Outpatient Diabetes medications: Glucotrol 10 mg qam, Glucotrol  with supper, Metformin  bid Current orders for Inpatient glycemic control: Glucotrol 10 mg qam, Glucotrol  with supper, Metformin  bid  Inpatient Diabetes Program Recommendations:  No order for blood sugar checks- please order CBG tid/hs since patient is taking Glucotrol and Metformin.    Susette Racer, RN, BA, MHA, CDE Diabetes Coordinator Inpatient Diabetes Program  [phone] (Team Pager) [phone] [facility] Office) 06/23/2016 10:53 AM

## 2016-06-23 NOTE — Progress Notes (Signed)
06/23/2016  1:58 PM  Dave Conrad to be D/C'd Home per MD order.  Discussed prescriptions and follow up appointments with the patient. Prescriptions given to patient, medication list explained in detail. Pt verbalized understanding.  Allergies as of 06/23/2016      Reactions   Shellfish Allergy Anaphylaxis   Penicillins Nausea And Vomiting   Has patient had a PCN reaction causing immediate rash, facial/tongue/throat swelling, SOB or lightheadedness with hypotension: yes rash Has patient had a PCN reaction causing severe rash involving mucus membranes or skin necrosis:no Has patient had a PCN reaction that required hospitalization no Has patient had a PCN reaction occurring within the last 10 years: yes If all of the above answers are "NO", then may proceed with Cephalosporin use.      Medication List    TAKE these medications   aspirin EC 81 MG tablet Take 81 mg by mouth every evening.   CENTRUM ULTRA MENS PO Take 1 tablet by mouth daily.   cephALEXin 500 MG capsule Commonly known as:  KEFLEX Take 1 capsule (500 mg total) by mouth every 8 (eight) hours.   diltiazem 180 MG 24 hr capsule Commonly known as:  DILACOR XR Take 180 mg by mouth daily.   docusate sodium 100 MG capsule Commonly known as:  COLACE Take 1 capsule (100 mg total) by mouth 2 (two) times daily.   efavirenz-emtricitabine-tenofovir 600-200-300 MG tablet Commonly known as:  ATRIPLA Take 1 tablet by mouth at bedtime.   glipiZIDE 10 MG 24 hr tablet Commonly known as:  GLUCOTROL XL Take 5-10 mg by mouth 2 (two) times daily. Take 10 mg in the morning and 5 mg in the evening   lisinopril 40 MG tablet Commonly known as:  PRINIVIL,ZESTRIL Take 40 mg by mouth daily.   metFORMIN 500 MG tablet Commonly known as:  GLUCOPHAGE Take 1,000 mg by mouth 2 (two) times daily with a meal.   oxybutynin 5 MG tablet Commonly known as:  DITROPAN Take 1 tablet (5 mg total) by mouth every 8 (eight) hours as needed for bladder  spasms.   oxyCODONE-acetaminophen 5-325 MG tablet Commonly known as:  PERCOCET/ROXICET Take 1-2 tablets by mouth every 4 (four) hours as needed for moderate pain.   pravastatin 20 MG tablet Commonly known as:  PRAVACHOL Take 20 mg by mouth every evening.   SAW PALMETTO PO Take 2 tablets by mouth 2 (two) times daily.       Vitals:   06/23/16 0834 06/23/16 1242  BP: (!) 143/95 (!) 151/89  Pulse: 89 89  Resp:    Temp: 98.5 F (36.9 C) 98.3 F (36.8 C)    Skin clean, dry and intact without evidence of skin break down, no evidence of skin tears noted. IV catheter discontinued intact. Site without signs and symptoms of complications. Dressing and pressure applied. Pt denies pain at this time. No complaints noted.  An After Visit Summary was printed and given to the patient. Patient escorted via WC, and D/C home via private auto.  Bradly Chris

## 2016-06-24 ENCOUNTER — Telehealth: Payer: Self-pay | Admitting: Radiology

## 2016-06-24 ENCOUNTER — Other Ambulatory Visit: Payer: Self-pay | Admitting: Radiology

## 2016-06-24 DIAGNOSIS — N200 Calculus of kidney: Secondary | ICD-10-CM

## 2016-06-24 NOTE — Anesthesia Postprocedure Evaluation (Signed)
Anesthesia Post Note  Patient: Elbert Azucena Kuba  Procedure(s) Performed: Procedure(s) (LRB): NEPHROLITHOTOMY PERCUTANEOUS (Right) ANTEGRADE URETEROSCOPY (Right) CYSTOSCOPY WITH URETHRAL DILATATION (N/A) HOLMIUM LASER APPLICATION (N/A)  Patient location during evaluation: PACU Anesthesia Type: General Level of consciousness: awake and alert Pain management: pain level controlled Vital Signs Assessment: post-procedure vital signs reviewed and stable Respiratory status: spontaneous breathing, nonlabored ventilation, respiratory function stable and patient connected to nasal cannula oxygen Cardiovascular status: blood pressure returned to baseline and stable Postop Assessment: no signs of nausea or vomiting Anesthetic complications: no     Last Vitals:  Vitals:   06/23/16 0834 06/23/16 1242  BP: (!) 143/95 (!) 151/89  Pulse: 89 89  Resp:    Temp: 36.9 C 36.8 C    Last Pain:  Vitals:   06/23/16 1242  TempSrc: Oral  PainSc:                  Lenard Simmer

## 2016-06-24 NOTE — Telephone Encounter (Signed)
Notified pt of surgery scheduled with Dr Apolinar Junes on 07/13/16, pre-admit testing phone interview & to call Friday prior to surgery for arrival time to SDS. Advised pt to hold ASA  x7 days prior to surgery. Pt voices understanding.

## 2016-06-26 DIAGNOSIS — E119 Type 2 diabetes mellitus without complications: Secondary | ICD-10-CM | POA: Insufficient documentation

## 2016-06-26 DIAGNOSIS — I10 Essential (primary) hypertension: Secondary | ICD-10-CM | POA: Insufficient documentation

## 2016-06-30 LAB — STONE ANALYSIS
Ammonium Acid Urate: 10 %
Ca phos cry stone ql IR: 50 %
Magnesium Ammon Phos: 40 %
STONE WEIGHT KSTONE: 1705.5 mg

## 2016-07-01 ENCOUNTER — Ambulatory Visit (INDEPENDENT_AMBULATORY_CARE_PROVIDER_SITE_OTHER): Payer: Managed Care, Other (non HMO) | Admitting: Urology

## 2016-07-01 ENCOUNTER — Other Ambulatory Visit: Payer: Self-pay | Admitting: Radiology

## 2016-07-01 DIAGNOSIS — N200 Calculus of kidney: Secondary | ICD-10-CM

## 2016-07-01 NOTE — Progress Notes (Signed)
07/01/2016 4:54 PM   Dave Conrad 07/11/1975 [id number]  Referring provider: [facility] [address] [place], [place] [address]  No chief complaint on file.   HPI: 41 year old male who returns today following PCNL on 06/22/2016 to discuss definitive management of residual stone burden.   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 The ureteral stent was placed emergently in the setting of Proteus bacteremia which was pansensitive and discharged with Kelfex x 14 days.   Incidentally, he was noted to have bulbar urethral stricture was noted in his Foley catheter was maintained for 3 days postop.  He returned to the operating room on 06/22/2016 for a right PCNL at which time the ureteral stone was treated as well as the majority of the right-sided stone burden. There were 2 small stones residual which were not able to be accessed at the time of PCNL. An antegrade double-J ureteral stent was placed.  Stone composition consistent with struvite stone, 50% calcium phosphate, 40% magnesium ammonium phosphate, 10% ammonium acid urinate.  Today, he has minimal complaints. He does have some urinary urgency, otherwise is doing very well. His wound has healed well without further drainage. No fevers or chills. No dysuria or gross hematuria.  He has no personal history of stones prior to this.  He reports that his HIV is well controlled with a low viral load.   PMH: Past Medical History:  Diagnosis Date  . Asplenia 05/08/2016  . Diabetes mellitus without complication (HCC)   . H/O sepsis 05/06/2016   due to right kidney stone  . History of kidney stones   . HIV (human immunodeficiency virus infection) (HCC)   . Hypertension     Surgical History: Past Surgical History:  Procedure Laterality Date  . CYSTOSCOPY WITH STENT  PLACEMENT Right 05/09/2016   Procedure: CYSTOSCOPY WITH STENT PLACEMENT and retrorade pyelogram;  Surgeon: Crist Fat, MD;  Location: [facility];  Service: Urology;  Laterality: Right;  . CYSTOSCOPY WITH URETHRAL DILATATION N/A 06/22/2016   Procedure: CYSTOSCOPY WITH URETHRAL DILATATION;  Surgeon: Vanna Scotland, MD;  Location: [facility];  Service: Urology;  Laterality: N/A;  . HOLMIUM LASER APPLICATION N/A 06/22/2016   Procedure: HOLMIUM LASER APPLICATION;  Surgeon: Vanna Scotland, MD;  Location: [facility];  Service: Urology;  Laterality: N/A;  . IR NEPHROSTOMY PLACEMENT RIGHT  06/22/2016  . NEPHROLITHOTOMY Right 06/22/2016   Procedure: NEPHROLITHOTOMY PERCUTANEOUS;  Surgeon: Vanna Scotland, MD;  Location: [facility];  Service: Urology;  Laterality: Right;  . SPLENECTOMY, TOTAL  1982  . URETEROSCOPY Right 06/22/2016   Procedure: ANTEGRADE URETEROSCOPY;  Surgeon: Vanna Scotland, MD;  Location: [facility];  Service: Urology;  Laterality: Right;    Home Medications:  Allergies as of 07/01/2016      Reactions   Shellfish Allergy Anaphylaxis   Penicillins Nausea And Vomiting   Has patient had a PCN reaction causing immediate rash, facial/tongue/throat swelling, SOB or lightheadedness with hypotension: yes rash Has patient had a PCN reaction causing severe rash involving mucus membranes or skin necrosis:no Has patient had a PCN reaction that required hospitalization no Has patient had a PCN reaction occurring within the last 10 years: yes If all of the above answers are "NO", then may proceed with Cephalosporin use.      Medication List       Accurate as of 07/01/16 11:59  PM. Always use your most recent med list.          aspirin EC 81 MG tablet Take 81 mg by mouth at bedtime.   CENTRUM ULTRA MENS PO Take 1 tablet by mouth daily.   diltiazem 180 MG 24 hr capsule Commonly known as:  DILACOR XR Take 180 mg by mouth daily.   docusate sodium 100 MG capsule Commonly known as:  COLACE Take 1  capsule (100 mg total) by mouth 2 (two) times daily.   efavirenz-emtricitabine-tenofovir 600-200-300 MG tablet Commonly known as:  ATRIPLA Take 1 tablet by mouth at bedtime.   glipiZIDE 10 MG 24 hr tablet Commonly known as:  GLUCOTROL XL Take 5-10 mg by mouth 2 (two) times daily. 10 mg in the morning & 5 mg in the evening   lisinopril 40 MG tablet Commonly known as:  PRINIVIL,ZESTRIL Take 40 mg by mouth every evening.   metFORMIN 500 MG tablet Commonly known as:  GLUCOPHAGE Take 1,000 mg by mouth 2 (two) times daily with a meal.   pravastatin 20 MG tablet Commonly known as:  PRAVACHOL Take 20 mg by mouth every evening.   SAW PALMETTO PO Take 2 tablets by mouth 2 (two) times daily.       Allergies:  Allergies  Allergen Reactions  . Shellfish Allergy Anaphylaxis  . Penicillins Nausea And Vomiting    Has patient had a PCN reaction causing immediate rash, facial/tongue/throat swelling, SOB or lightheadedness with hypotension: yes rash Has patient had a PCN reaction causing severe rash involving mucus membranes or skin necrosis:no Has patient had a PCN reaction that required hospitalization no Has patient had a PCN reaction occurring within the last 10 years: yes If all of the above answers are "NO", then may proceed with Cephalosporin use.     Family History: Family History  Problem Relation Age of Onset  . Breast cancer Mother   . Kidney cancer Neg Hx   . Kidney disease Neg Hx   . Prostate cancer Neg Hx     Social History:  reports that he quit smoking about a year ago. His smoking use included Cigarettes. He smoked 0.25 packs per day. He has never used smokeless tobacco. He reports that he does not drink alcohol or use drugs.  ROS: UROLOGY Frequent Urination?: No Hard to postpone urination?: No Burning/pain with urination?: No Get up at night to urinate?: No Leakage of urine?: No Urine stream starts and stops?: No Trouble starting stream?: No Do you have to  strain to urinate?: No Blood in urine?: No Urinary tract infection?: No Sexually transmitted disease?: No Injury to kidneys or bladder?: No Painful intercourse?: No Weak stream?: No Erection problems?: No Penile pain?: No  Gastrointestinal Nausea?: No Vomiting?: No Indigestion/heartburn?: No Diarrhea?: No Constipation?: No  Constitutional Fever: No Night sweats?: No Weight loss?: No Fatigue?: No  Skin Skin rash/lesions?: No Itching?: No  Eyes Blurred vision?: No Double vision?: No  Ears/Nose/Throat Sore throat?: No Sinus problems?: No  Hematologic/Lymphatic Swollen glands?: No Easy bruising?: No  Cardiovascular Leg swelling?: No Chest pain?: No  Respiratory Cough?: No Shortness of breath?: No  Endocrine Excessive thirst?: No  Musculoskeletal Back pain?: No Joint pain?: No  Neurological Headaches?: No Dizziness?: No  Psychologic Depression?: No Anxiety?: No  Physical Exam: Vitals in epic Constitutional:  Alert and oriented, No acute distress. HEENT: [place] AT, moist mucus membranes.  Trachea midline, no masses. Cardiovascular: No clubbing, cyanosis, or edema. Respiratory: Normal respiratory effort, no increased work  of breathing. GI: Abdomen is soft, nontender, nondistended, no abdominal masses GU: Right flank incision healing well. Skin: No rashes, bruises or suspicious lesions. Neurologic: Grossly intact, no focal deficits, moving all 4 extremities. Psychiatric: Normal mood and affect.  Laboratory Data: Lab Results  Component Value Date   WBC 12.2 (H) 06/23/2016   HGB 10.9 (L) 06/23/2016   HCT 33.1 (L) 06/23/2016   MCV 78.2 (L) 06/23/2016   PLT 184 06/23/2016    Lab Results  Component Value Date   CREATININE 0.95 06/23/2016    Lab Results  Component Value Date   HGBA1C 7.5 (H) 05/08/2016    Urinalysis Results for orders placed or performed in visit on 07/01/16  CULTURE, URINE COMPREHENSIVE  Result Value Ref Range   Urine  Culture, Comprehensive Final report    Result 1 Comment     Pertinent Imaging: n/a  Assessment & Plan:    1. Right kidney stone Patient has small residual stone burden following right PCNL. Given that the stone is infectious in nature, I recommended returning to the operating room for ureteroscopy to clear all residual stone burden. He is agreeable with this plan. Risks and benefits of ureteroscopy were reviewed including but not limited to infection, bleeding, pain, ureteral injury which could require open surgery versus prolonged indwelling if ureteralperforation occurs, persistent stone disease, requirement for staged procedure, possible stent, and global anesthesia risks. Patient expressed understanding and desires to proceed with ureteroscopy. - CULTURE, URINE COMPREHENSIVE  Schedule right URS, LL, stent exchange  Vanna ScotlandAshley Teriann Livingood, MD  [facility] [address] [place] [address], [address] [place], [place] [address] [phone]

## 2016-07-03 ENCOUNTER — Encounter
Admission: RE | Admit: 2016-07-03 | Discharge: 2016-07-03 | Disposition: A | Discharge status: Home / Self Care | Payer: Managed Care, Other (non HMO) | Source: Ambulatory Visit | Attending: Urology | Admitting: Urology

## 2016-07-03 NOTE — Patient Instructions (Signed)
  Your procedure is scheduled on: 07/13/16 Report to Day Surgery. [facility]OND FLOOR To find out your arrival time please call [phone] between 1PM - 3PM on 07/10/16 Remember: Instructions that are not followed completely may result in serious medical risk, up to and including death, or upon the discretion of your surgeon and anesthesiologist your surgery may need to be rescheduled.    X____ 1. Do not eat food or drink liquids after midnight. No gum chewing or hard candies.     _X___ 2. No Alcohol for 24 hours before or after surgery.   ____ 3. Do Not Smoke For 24 Hours Prior to Your Surgery.   ____ 4. Bring all medications with you on the day of surgery if instructed.    _X___ 5. Notify your doctor if there is any change in your medical condition     (cold, fever, infections).       Do not wear jewelry, make-up, hairpins, clips or nail polish.  Do not wear lotions, powders, or perfumes. You may wear deodorant.  Do not shave 48 hours prior to surgery. Men may shave face and neck.  Do not bring valuables to the hospital.    [facility] is not responsible for any belongings or valuables.               Contacts, dentures or bridgework may not be worn into surgery.  Leave your suitcase in the car. After surgery it may be brought to your room.  For patients admitted to the hospital, discharge time is determined by your                treatment team.   Patients discharged the day of surgery will not be allowed to drive home.     ___X_ Take these medicines the morning of surgery with A SIP OF WATER:    1.DILACOR  2. LISINOPRIL  3.   4.  5.  6.  ____ Fleet Enema (as directed)   ____ Use CHG Soap as directed  ____ Use inhalers on the day of surgery  _X___ Stop metformin 2 days prior to surgery    ____ Take 1/2 of usual insulin dose the night before surgery and none on the morning of surgery.   _X___ Stop Coumadin/Plavix/aspirin on   STOP 7 DAYS PREOP ____ Stop  Anti-inflammatories on  X____ Stop supplements until after surgery.   STOP SAW PALMETTO UNTIL AFTER SURGERY  ____ Bring C-Pap to the hospital.

## 2016-07-05 LAB — CULTURE, URINE COMPREHENSIVE

## 2016-07-06 ENCOUNTER — Encounter
Admission: RE | Admit: 2016-07-06 | Discharge: 2016-07-06 | Disposition: A | Discharge status: Home / Self Care | Payer: Managed Care, Other (non HMO) | Source: Ambulatory Visit | Attending: Urology | Admitting: Urology

## 2016-07-06 ENCOUNTER — Encounter: Payer: Self-pay | Admitting: Urology

## 2016-07-06 ENCOUNTER — Telehealth: Payer: Self-pay | Admitting: Urology

## 2016-07-06 DIAGNOSIS — Z01812 Encounter for preprocedural laboratory examination: Secondary | ICD-10-CM | POA: Diagnosis present

## 2016-07-06 LAB — POTASSIUM: POTASSIUM: 3.7 mmol/L (ref 3.5–5.1)

## 2016-07-06 NOTE — Telephone Encounter (Signed)
Please advise 

## 2016-07-06 NOTE — Telephone Encounter (Signed)
Left letter at front desk for pt to pick up

## 2016-07-06 NOTE — Telephone Encounter (Signed)
Thanks fine.  Please provide.    Vanna ScotlandAshley Shooter Tangen, MD

## 2016-07-06 NOTE — Telephone Encounter (Signed)
Pt called office asking for a letter to go back to work this week before his next surgery on Monday.  States he just had surgery and can't stand being stuck in the house another week. Please advise. Thanks.

## 2016-07-13 ENCOUNTER — Ambulatory Visit: Payer: Managed Care, Other (non HMO) | Admitting: Anesthesiology

## 2016-07-13 ENCOUNTER — Encounter
Admission: RE | Disposition: A | Discharge status: Home / Self Care | Payer: Self-pay | Source: Ambulatory Visit | Attending: Urology

## 2016-07-13 ENCOUNTER — Ambulatory Visit
Admission: RE | Admit: 2016-07-13 | Discharge: 2016-07-13 | Disposition: A | Discharge status: Home / Self Care | Payer: Managed Care, Other (non HMO) | Source: Ambulatory Visit | Attending: Urology | Admitting: Urology

## 2016-07-13 DIAGNOSIS — Z87442 Personal history of urinary calculi: Secondary | ICD-10-CM | POA: Diagnosis not present

## 2016-07-13 DIAGNOSIS — Z880 Allergy status to penicillin: Secondary | ICD-10-CM | POA: Insufficient documentation

## 2016-07-13 DIAGNOSIS — Z79899 Other long term (current) drug therapy: Secondary | ICD-10-CM | POA: Insufficient documentation

## 2016-07-13 DIAGNOSIS — E119 Type 2 diabetes mellitus without complications: Secondary | ICD-10-CM | POA: Insufficient documentation

## 2016-07-13 DIAGNOSIS — Z87891 Personal history of nicotine dependence: Secondary | ICD-10-CM | POA: Diagnosis not present

## 2016-07-13 DIAGNOSIS — Z7984 Long term (current) use of oral hypoglycemic drugs: Secondary | ICD-10-CM | POA: Insufficient documentation

## 2016-07-13 DIAGNOSIS — N200 Calculus of kidney: Secondary | ICD-10-CM | POA: Insufficient documentation

## 2016-07-13 DIAGNOSIS — I10 Essential (primary) hypertension: Secondary | ICD-10-CM | POA: Diagnosis not present

## 2016-07-13 DIAGNOSIS — Z7982 Long term (current) use of aspirin: Secondary | ICD-10-CM | POA: Insufficient documentation

## 2016-07-13 HISTORY — PX: CYSTOSCOPY/URETEROSCOPY/HOLMIUM LASER/STENT PLACEMENT: SHX6546

## 2016-07-13 HISTORY — PX: CYSTOSCOPY W/ RETROGRADES: SHX1426

## 2016-07-13 LAB — GLUCOSE, CAPILLARY
GLUCOSE-CAPILLARY: 165 mg/dL — AB (ref 65–99)
Glucose-Capillary: 166 mg/dL — ABNORMAL HIGH (ref 65–99)

## 2016-07-13 SURGERY — CYSTOSCOPY/URETEROSCOPY/HOLMIUM LASER/STENT PLACEMENT
Anesthesia: General | Site: Ureter | Laterality: Right | Wound class: Clean Contaminated

## 2016-07-13 MED ORDER — ONDANSETRON HCL 4 MG/2ML IJ SOLN
INTRAMUSCULAR | Status: AC
  Filled 2016-07-13: qty 2

## 2016-07-13 MED ORDER — LIDOCAINE HCL (CARDIAC) 20 MG/ML IV SOLN
INTRAVENOUS | Status: DC | PRN
  Administered 2016-07-13: 100 mg via INTRAVENOUS

## 2016-07-13 MED ORDER — GENTAMICIN SULFATE 40 MG/ML IJ SOLN
425.0000 mg | Freq: Once | INTRAVENOUS | Status: AC
  Administered 2016-07-13: 430 mg via INTRAVENOUS
  Filled 2016-07-13: qty 10.75

## 2016-07-13 MED ORDER — SODIUM CHLORIDE 0.9 % IV SOLN
INTRAVENOUS | Status: DC
  Administered 2016-07-13: 09:00:00 via INTRAVENOUS

## 2016-07-13 MED ORDER — PROPOFOL 10 MG/ML IV BOLUS
INTRAVENOUS | Status: AC
  Filled 2016-07-13: qty 20

## 2016-07-13 MED ORDER — PROPOFOL 10 MG/ML IV BOLUS
INTRAVENOUS | Status: DC | PRN
  Administered 2016-07-13: 170 mg via INTRAVENOUS

## 2016-07-13 MED ORDER — ROCURONIUM BROMIDE 50 MG/5ML IV SOLN
INTRAVENOUS | Status: AC
  Filled 2016-07-13: qty 1

## 2016-07-13 MED ORDER — ONDANSETRON HCL 4 MG/2ML IJ SOLN
INTRAMUSCULAR | Status: DC | PRN
  Administered 2016-07-13: 4 mg via INTRAVENOUS

## 2016-07-13 MED ORDER — FENTANYL CITRATE (PF) 100 MCG/2ML IJ SOLN
INTRAMUSCULAR | Status: DC | PRN
Start: 2016-07-13 — End: 2016-07-13
  Administered 2016-07-13 (×4): 50 ug via INTRAVENOUS

## 2016-07-13 MED ORDER — IOTHALAMATE MEGLUMINE 43 % IV SOLN
INTRAVENOUS | Status: DC | PRN
  Administered 2016-07-13: 20 mL via URETHRAL

## 2016-07-13 MED ORDER — SUGAMMADEX SODIUM 200 MG/2ML IV SOLN
INTRAVENOUS | Status: AC
  Filled 2016-07-13: qty 2

## 2016-07-13 MED ORDER — MIDAZOLAM HCL 2 MG/2ML IJ SOLN
INTRAMUSCULAR | Status: DC | PRN
  Administered 2016-07-13: 2 mg via INTRAVENOUS

## 2016-07-13 MED ORDER — GLYCOPYRROLATE 0.2 MG/ML IJ SOLN
INTRAMUSCULAR | Status: AC
  Filled 2016-07-13: qty 1

## 2016-07-13 MED ORDER — DEXAMETHASONE SODIUM PHOSPHATE 10 MG/ML IJ SOLN
INTRAMUSCULAR | Status: AC
  Filled 2016-07-13: qty 1

## 2016-07-13 MED ORDER — SUCCINYLCHOLINE CHLORIDE 20 MG/ML IJ SOLN
INTRAMUSCULAR | Status: DC | PRN
  Administered 2016-07-13: 100 mg via INTRAVENOUS

## 2016-07-13 MED ORDER — MIDAZOLAM HCL 2 MG/2ML IJ SOLN
INTRAMUSCULAR | Status: AC
  Filled 2016-07-13: qty 2

## 2016-07-13 MED ORDER — DEXAMETHASONE SODIUM PHOSPHATE 10 MG/ML IJ SOLN
INTRAMUSCULAR | Status: DC | PRN
  Administered 2016-07-13: 4 mg via INTRAVENOUS

## 2016-07-13 MED ORDER — ROCURONIUM BROMIDE 100 MG/10ML IV SOLN
INTRAVENOUS | Status: DC | PRN
  Administered 2016-07-13 (×3): 5 mg via INTRAVENOUS
  Administered 2016-07-13: 45 mg via INTRAVENOUS
  Administered 2016-07-13: 5 mg via INTRAVENOUS

## 2016-07-13 MED ORDER — FAMOTIDINE 20 MG PO TABS
20.0000 mg | ORAL_TABLET | Freq: Once | ORAL | Status: AC
  Administered 2016-07-13: 20 mg via ORAL

## 2016-07-13 MED ORDER — FAMOTIDINE 20 MG PO TABS
ORAL_TABLET | ORAL | Status: AC
  Administered 2016-07-13: 20 mg via ORAL
  Filled 2016-07-13: qty 1

## 2016-07-13 MED ORDER — SODIUM CHLORIDE 0.9 % IV SOLN
INTRAVENOUS | Status: AC
  Filled 2016-07-13: qty 2000

## 2016-07-13 MED ORDER — ONDANSETRON HCL 4 MG/2ML IJ SOLN: 4.0000 mg | Freq: Once | INTRAMUSCULAR | Status: DC | PRN

## 2016-07-13 MED ORDER — PHENYLEPHRINE HCL 10 MG/ML IJ SOLN
INTRAMUSCULAR | Status: DC | PRN
  Administered 2016-07-13: 200 ug via INTRAVENOUS
  Administered 2016-07-13: 100 ug via INTRAVENOUS
  Administered 2016-07-13 (×3): 200 ug via INTRAVENOUS

## 2016-07-13 MED ORDER — SODIUM CHLORIDE 0.9 % IV SOLN
2.0000 g | INTRAVENOUS | Status: AC
  Administered 2016-07-13: 2 g via INTRAVENOUS
  Filled 2016-07-13: qty 2000

## 2016-07-13 MED ORDER — LIDOCAINE HCL (PF) 2 % IJ SOLN
INTRAMUSCULAR | Status: AC
  Filled 2016-07-13: qty 2

## 2016-07-13 MED ORDER — FENTANYL CITRATE (PF) 100 MCG/2ML IJ SOLN
INTRAMUSCULAR | Status: AC
  Filled 2016-07-13: qty 2

## 2016-07-13 MED ORDER — EPHEDRINE SULFATE 50 MG/ML IJ SOLN
INTRAMUSCULAR | Status: AC
  Filled 2016-07-13: qty 1

## 2016-07-13 MED ORDER — SUGAMMADEX SODIUM 200 MG/2ML IV SOLN
INTRAVENOUS | Status: DC | PRN
  Administered 2016-07-13: 200 mg via INTRAVENOUS

## 2016-07-13 MED ORDER — FENTANYL CITRATE (PF) 100 MCG/2ML IJ SOLN: 25.0000 ug | INTRAMUSCULAR | Status: DC | PRN

## 2016-07-13 SURGICAL SUPPLY — 28 items
BAG DRAIN CYSTO-URO LG1000N (MISCELLANEOUS) ×3 IMPLANT
BASKET ZERO TIP 1.9FR (BASKET) ×3 IMPLANT
BRUSH SCRUB EZ 1% IODOPHOR (MISCELLANEOUS) ×3 IMPLANT
CATH URETL 5X70 OPEN END (CATHETERS) ×3 IMPLANT
CNTNR SPEC 2.5X3XGRAD LEK (MISCELLANEOUS)
CONRAY 43 FOR UROLOGY 50M (MISCELLANEOUS) ×3 IMPLANT
CONT SPEC 4OZ STER OR WHT (MISCELLANEOUS)
CONTAINER SPEC 2.5X3XGRAD LEK (MISCELLANEOUS) IMPLANT
DRAPE UTILITY 15X26 TOWEL STRL (DRAPES) ×3 IMPLANT
FIBER LASER LITHO 273 (Laser) ×3 IMPLANT
GLOVE BIO SURGEON STRL SZ 6.5 (GLOVE) ×3 IMPLANT
GOWN STRL REUS W/ TWL LRG LVL3 (GOWN DISPOSABLE) ×4 IMPLANT
GOWN STRL REUS W/TWL LRG LVL3 (GOWN DISPOSABLE) ×2
GUIDEWIRE SUPER STIFF (WIRE) ×3 IMPLANT
INFUSOR MANOMETER BAG 3000ML (MISCELLANEOUS) ×3 IMPLANT
INTRODUCER DILATOR DOUBLE (INTRODUCER) ×3 IMPLANT
KIT RM TURNOVER CYSTO AR (KITS) ×3 IMPLANT
PACK CYSTO AR (MISCELLANEOUS) ×3 IMPLANT
SENSORWIRE 0.038 NOT ANGLED (WIRE) ×6
SET CYSTO W/LG BORE CLAMP LF (SET/KITS/TRAYS/PACK) ×3 IMPLANT
SHEATH URETERAL 12FRX35CM (MISCELLANEOUS) ×3 IMPLANT
SOL .9 NS 3000ML IRR  AL (IV SOLUTION) ×1
SOL .9 NS 3000ML IRR UROMATIC (IV SOLUTION) ×2 IMPLANT
STENT URET 6FRX24 CONTOUR (STENTS) IMPLANT
STENT URET 6FRX26 CONTOUR (STENTS) ×3 IMPLANT
SURGILUBE 2OZ TUBE FLIPTOP (MISCELLANEOUS) ×3 IMPLANT
WATER STERILE IRR 1000ML POUR (IV SOLUTION) ×3 IMPLANT
WIRE SENSOR 0.038 NOT ANGLED (WIRE) ×4 IMPLANT

## 2016-07-13 NOTE — Op Note (Signed)
Date of procedure: 07/13/16  Preoperative diagnosis:  1. Right kidney stone   Postoperative diagnosis:  1. Right kidney stone 2. Left pelvic phleboliths   Procedure: 1. Cystoscopy 2. Bilateral retrograde pyelogram 3. Right ureteroscopy 4. Extraction of stone 5. Right ureteral stent exchange  Surgeon: Dave ScotlandAshley Sorcha Rotunno, MD  Anesthesia: General  Complications: None  Intraoperative findings: Large ~1 cm right lower pole stone treated  EBL: minimal  Specimens: none   Drains: 6 x 26 French double-J ureteral stent unrelated  Indication: Dave Conrad is a 41 y.o. patient with .  After reviewing the management options for treatment, he elected to proceed with the above surgical procedure(s). We have discussed the potential benefits and risks of the procedure, side effects of the proposed treatment, the likelihood of the patient achieving the goals of the procedure, and any potential problems that might occur during the procedure or recuperation. Informed consent has been obtained.  Description of procedure:  The patient was taken to the operating room and general anesthesia was induced.  The patient was placed in the dorsal lithotomy position, prepped and draped in the usual sterile fashion, and preoperative antibiotics were administered. A preoperative time-out was performed.   A 21 French scope was advanced per urethra into the bladder. On scout imaging, a stone could be seen in the lower pole of the right kidney measuring approximately 1 cm. There is also calcification in the left pelvis which was worrisome for a possible left ureteral stone. As such, the decision was made to proceed with left retrograde pyelogram for further evaluation of this possible stone. An open-ended ureteral catheter was inserted just within the left ureteral orifice using the wire to advance it safely. A gentle retrograde pyelogram was performed which revealed that the complication in question was indeed outside  of the ureter and consistent with a pelvic phlebolith. As such, no further intervention or diagnostic workup was performed on the left side.  Next, within the bladder, the ureteral stent was seen emanating with mild encrustation. The distal coil of the stent was grasped and brought to level of the urethral meatus. At this point in time, the stent was cannulated using a sensor wire up to level of the kidney. A dual lumen sheath was used just within the distal ureter under fluoroscopic guidance to advance a Super Stiff wire up to level of the kidney as a working wire. The sensor wire was snapped in place as a safety wire. A ureteral access sheath, Adriana SimasCook 12/14 was then able to be advanced easily up to level of the proximal ureter. The inner lumen was removed. A dual channel Wolf 8 French ureteroscope was then brought in. The scope was directed towards the lower pole stone which is easily identified. A 273  laser fiber was then brought in and using dusting settings of 0.2 J and 40 Hz, the stone was dusted into fairly small particles which could be irrigated out the kidney and also removed using a 1.9 JamaicaFrench to plus nitinol basket.  Given the size of the stone, this did take some time to accomplish. Once this calyx was adequately cleared of of any significant stone burden greater than tip of the laser fiber in size, the scope was backed to the renal pelvis. A retrograde pyelogram was performed using full strength Conray. This created a roadmap of the kidney. Each and every calyx was then directly visualized to ensure that there is no residual stone burden.  There was a significant amount of debris  from stone fragmentation, but no significantly sized residual stones.  The scope and access sheath were then backed down the length of the ureter inspecting the ureter along the way. There is no skin residual ureteral stone fragments. Given the very distal ureter, there was a small mucosal abrasion presumably from the access  sheath which appeared superficial and mild. Given the amount of residual stone dust material as well as the small mucosal abrasion, the decision was made to place a ureteral stent. The safety wire was backloaded over a rigid cystoscope. A 6 x 26 French double-J ureteral stent was then advanced over the wire up to level of an upper pole calyx. The wire was partially withdrawn until a loop was noted over the both upper pole calyx. The wire was then fully withdrawn and a full coil was noted within the bladder. The bladder was then drained. The scope was then removed, the patient was cleaned and dried, repositioned supine position, reversed from anesthesia, and taken to the PACU in stable condition.  Plan: Patient will return next week for cystoscopy, stent removal. No additional scripts were given today as the patient has these from his previous surgery.  Dave Scotland, M.D.

## 2016-07-13 NOTE — Anesthesia Preprocedure Evaluation (Addendum)
Anesthesia Evaluation  Patient identified by MRN, date of birth, ID band Patient awake    Reviewed: Allergy & Precautions, NPO status , Patient's Chart, lab work & pertinent test results  History of Anesthesia Complications (+) DIFFICULT AIRWAY and history of anesthetic complications  Airway Mallampati: II       Dental   Pulmonary neg pulmonary ROS, former smoker,           Cardiovascular hypertension, Pt. on medications      Neuro/Psych negative neurological ROS     GI/Hepatic negative GI ROS, Neg liver ROS,   Endo/Other  diabetes, Type 2, Oral Hypoglycemic Agents  Renal/GU Renal disease (stones)     Musculoskeletal   Abdominal   Peds  Hematology   Anesthesia Other Findings   Reproductive/Obstetrics                            Anesthesia Physical Anesthesia Plan  ASA: III  Anesthesia Plan: General   Post-op Pain Management:    Induction: Intravenous  Airway Management Planned: Oral ETT  Additional Equipment:   Intra-op Plan:   Post-operative Plan:   Informed Consent: I have reviewed the patients History and Physical, chart, labs and discussed the procedure including the risks, benefits and alternatives for the proposed anesthesia with the patient or authorized representative who has indicated his/her understanding and acceptance.     Plan Discussed with:   Anesthesia Plan Comments:         Anesthesia Quick Evaluation

## 2016-07-13 NOTE — OR Nursing (Signed)
Clarified antibiotics with Dr Apolinar JunesBrandon no new orders.

## 2016-07-13 NOTE — Anesthesia Procedure Notes (Signed)
Procedure Name: Intubation Date/Time: 07/13/2016 10:02 AM Performed by: Marlana SalvageJESSUP, Sequita Wise Pre-anesthesia Checklist: Patient identified, Emergency Drugs available, Suction available, Patient being monitored and Timeout performed Patient Re-evaluated:Patient Re-evaluated prior to inductionOxygen Delivery Method: Circle system utilized Preoxygenation: Pre-oxygenation with 100% oxygen Intubation Type: IV induction Ventilation: Mask ventilation without difficulty Laryngoscope Size: McGraph and 4 Grade View: Grade III Tube type: Oral Tube size: 7.5 mm Number of attempts: 1 Airway Equipment and Method: Stylet and Video-laryngoscopy Placement Confirmation: ETT inserted through vocal cords under direct vision,  positive ETCO2 and breath sounds checked- equal and bilateral Secured at: 21 cm Tube secured with: Tape Dental Injury: Teeth and Oropharynx as per pre-operative assessment  Difficulty Due To: Difficulty was anticipated Comments: Easy one hand mask airway without oral airway.

## 2016-07-13 NOTE — Anesthesia Postprocedure Evaluation (Signed)
Anesthesia Post Note  Patient: Dave Conrad  Procedure(s) Performed: Procedure(s) (LRB): CYSTOSCOPY/URETEROSCOPY/HOLMIUM LASER/STENT EXCHANGE (Right) CYSTOSCOPY WITH RETROGRADE PYELOGRAM (Bilateral)  Patient location during evaluation: PACU Anesthesia Type: General Level of consciousness: awake and alert Pain management: pain level controlled Vital Signs Assessment: post-procedure vital signs reviewed and stable Respiratory status: spontaneous breathing and respiratory function stable Cardiovascular status: stable Anesthetic complications: no     Last Vitals:  Vitals:   07/13/16 1247 07/13/16 1302  BP: (!) 132/92 128/90  Pulse: 91 93  Resp: 20 (!) 28  Temp:  36.6 C    Last Pain:  Vitals:   07/13/16 1217  TempSrc: Temporal  PainSc: Asleep                 Mareo Portilla K

## 2016-07-13 NOTE — H&P (View-Only) (Signed)
 07/01/2016 4:54 PM   Dave Conrad 01/27/1976 [id number]  Referring provider: [place], [facility] [address] [place] [address] [place], [place] [address]  No chief complaint on file.   HPI: 40-year-old male who returns today following PCNL on 06/22/2016 to discuss definitive management of residual stone burden.   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 The ureteral stent was placed emergently in the setting of Proteus bacteremia which was pansensitive and discharged with Kelfex x 14 days.   Incidentally, he was noted to have bulbar urethral stricture was noted in his Foley catheter was maintained for 3 days postop.  He returned to the operating room on 06/22/2016 for a right PCNL at which time the ureteral stone was treated as well as the majority of the right-sided stone burden. There were 2 small stones residual which were not able to be accessed at the time of PCNL. An antegrade double-J ureteral stent was placed.  Stone composition consistent with struvite stone, 50% calcium phosphate, 40% magnesium ammonium phosphate, 10% ammonium acid urinate.  Today, he has minimal complaints. He does have some urinary urgency, otherwise is doing very well. His wound has healed well without further drainage. No fevers or chills. No dysuria or gross hematuria.  He has no personal history of stones prior to this.  He reports that his HIV is well controlled with a low viral load.   PMH: Past Medical History:  Diagnosis Date  . Asplenia 05/08/2016  . Diabetes mellitus without complication (HCC)   . H/O sepsis 05/06/2016   due to right kidney stone  . History of kidney stones   . HIV (human immunodeficiency virus infection) (HCC)   . Hypertension     Surgical History: Past Surgical History:  Procedure Laterality Date  . CYSTOSCOPY WITH STENT  PLACEMENT Right 05/09/2016   Procedure: CYSTOSCOPY WITH STENT PLACEMENT and retrorade pyelogram;  Surgeon: Benjamin W Herrick, MD;  Location: [facility];  Service: Urology;  Laterality: Right;  . CYSTOSCOPY WITH URETHRAL DILATATION N/A 06/22/2016   Procedure: CYSTOSCOPY WITH URETHRAL DILATATION;  Surgeon: Farhiya Rosten, MD;  Location: [facility];  Service: Urology;  Laterality: N/A;  . HOLMIUM LASER APPLICATION N/A 06/22/2016   Procedure: HOLMIUM LASER APPLICATION;  Surgeon: Mackinzie Vuncannon, MD;  Location: [facility];  Service: Urology;  Laterality: N/A;  . IR NEPHROSTOMY PLACEMENT RIGHT  06/22/2016  . NEPHROLITHOTOMY Right 06/22/2016   Procedure: NEPHROLITHOTOMY PERCUTANEOUS;  Surgeon: Ayeshia Coppin, MD;  Location: [facility];  Service: Urology;  Laterality: Right;  . SPLENECTOMY, TOTAL  1982  . URETEROSCOPY Right 06/22/2016   Procedure: ANTEGRADE URETEROSCOPY;  Surgeon: Gaylynn Seiple, MD;  Location: [facility];  Service: Urology;  Laterality: Right;    Home Medications:  Allergies as of 07/01/2016      Reactions   Shellfish Allergy Anaphylaxis   Penicillins Nausea And Vomiting   Has patient had a PCN reaction causing immediate rash, facial/tongue/throat swelling, SOB or lightheadedness with hypotension: yes rash Has patient had a PCN reaction causing severe rash involving mucus membranes or skin necrosis:no Has patient had a PCN reaction that required hospitalization no Has patient had a PCN reaction occurring within the last 10 years: yes If all of the above answers are "NO", then may proceed with Cephalosporin use.      Medication List       Accurate as of 07/01/16 11:59   PM. Always use your most recent med list.          aspirin EC 81 MG tablet Take 81 mg by mouth at bedtime.   CENTRUM ULTRA MENS PO Take 1 tablet by mouth daily.   diltiazem 180 MG 24 hr capsule Commonly known as:  DILACOR XR Take 180 mg by mouth daily.   docusate sodium 100 MG capsule Commonly known as:  COLACE Take 1  capsule (100 mg total) by mouth 2 (two) times daily.   efavirenz-emtricitabine-tenofovir 600-200-300 MG tablet Commonly known as:  ATRIPLA Take 1 tablet by mouth at bedtime.   glipiZIDE 10 MG 24 hr tablet Commonly known as:  GLUCOTROL XL Take 5-10 mg by mouth 2 (two) times daily. 10 mg in the morning & 5 mg in the evening   lisinopril 40 MG tablet Commonly known as:  PRINIVIL,ZESTRIL Take 40 mg by mouth every evening.   metFORMIN 500 MG tablet Commonly known as:  GLUCOPHAGE Take 1,000 mg by mouth 2 (two) times daily with a meal.   pravastatin 20 MG tablet Commonly known as:  PRAVACHOL Take 20 mg by mouth every evening.   SAW PALMETTO PO Take 2 tablets by mouth 2 (two) times daily.       Allergies:  Allergies  Allergen Reactions  . Shellfish Allergy Anaphylaxis  . Penicillins Nausea And Vomiting    Has patient had a PCN reaction causing immediate rash, facial/tongue/throat swelling, SOB or lightheadedness with hypotension: yes rash Has patient had a PCN reaction causing severe rash involving mucus membranes or skin necrosis:no Has patient had a PCN reaction that required hospitalization no Has patient had a PCN reaction occurring within the last 10 years: yes If all of the above answers are "NO", then may proceed with Cephalosporin use.     Family History: Family History  Problem Relation Age of Onset  . Breast cancer Mother   . Kidney cancer Neg Hx   . Kidney disease Neg Hx   . Prostate cancer Neg Hx     Social History:  reports that he quit smoking about a year ago. His smoking use included Cigarettes. He smoked 0.25 packs per day. He has never used smokeless tobacco. He reports that he does not drink alcohol or use drugs.  ROS: UROLOGY Frequent Urination?: No Hard to postpone urination?: No Burning/pain with urination?: No Get up at night to urinate?: No Leakage of urine?: No Urine stream starts and stops?: No Trouble starting stream?: No Do you have to  strain to urinate?: No Blood in urine?: No Urinary tract infection?: No Sexually transmitted disease?: No Injury to kidneys or bladder?: No Painful intercourse?: No Weak stream?: No Erection problems?: No Penile pain?: No  Gastrointestinal Nausea?: No Vomiting?: No Indigestion/heartburn?: No Diarrhea?: No Constipation?: No  Constitutional Fever: No Night sweats?: No Weight loss?: No Fatigue?: No  Skin Skin rash/lesions?: No Itching?: No  Eyes Blurred vision?: No Double vision?: No  Ears/Nose/Throat Sore throat?: No Sinus problems?: No  Hematologic/Lymphatic Swollen glands?: No Easy bruising?: No  Cardiovascular Leg swelling?: No Chest pain?: No  Respiratory Cough?: No Shortness of breath?: No  Endocrine Excessive thirst?: No  Musculoskeletal Back pain?: No Joint pain?: No  Neurological Headaches?: No Dizziness?: No  Psychologic Depression?: No Anxiety?: No  Physical Exam: Vitals in epic Constitutional:  Alert and oriented, No acute distress. HEENT: [place] AT, moist mucus membranes.  Trachea midline, no masses. Cardiovascular: No clubbing, cyanosis, or edema. Respiratory: Normal respiratory effort, no increased work   of breathing. GI: Abdomen is soft, nontender, nondistended, no abdominal masses GU: Right flank incision healing well. Skin: No rashes, bruises or suspicious lesions. Neurologic: Grossly intact, no focal deficits, moving all 4 extremities. Psychiatric: Normal mood and affect.  Laboratory Data: Lab Results  Component Value Date   WBC 12.2 (H) 06/23/2016   HGB 10.9 (L) 06/23/2016   HCT 33.1 (L) 06/23/2016   MCV 78.2 (L) 06/23/2016   PLT 184 06/23/2016    Lab Results  Component Value Date   CREATININE 0.95 06/23/2016    Lab Results  Component Value Date   HGBA1C 7.5 (H) 05/08/2016    Urinalysis Results for orders placed or performed in visit on 07/01/16  CULTURE, URINE COMPREHENSIVE  Result Value Ref Range   Urine  Culture, Comprehensive Final report    Result 1 Comment     Pertinent Imaging: n/a  Assessment & Plan:    1. Right kidney stone Patient has small residual stone burden following right PCNL. Given that the stone is infectious in nature, I recommended returning to the operating room for ureteroscopy to clear all residual stone burden. He is agreeable with this plan. Risks and benefits of ureteroscopy were reviewed including but not limited to infection, bleeding, pain, ureteral injury which could require open surgery versus prolonged indwelling if ureteralperforation occurs, persistent stone disease, requirement for staged procedure, possible stent, and global anesthesia risks. Patient expressed understanding and desires to proceed with ureteroscopy. - CULTURE, URINE COMPREHENSIVE  Schedule right URS, LL, stent exchange  Derold Dorsch, MD  [facility] [address], [address] [place], [place] [address] [phone]  

## 2016-07-13 NOTE — Anesthesia Post-op Follow-up Note (Cosign Needed)
Anesthesia QCDR form completed.        

## 2016-07-13 NOTE — Transfer of Care (Signed)
Immediate Anesthesia Transfer of Care Note  Patient: Dave Conrad  Procedure(s) Performed: Procedure(s): CYSTOSCOPY/URETEROSCOPY/HOLMIUM LASER/STENT EXCHANGE (Right) CYSTOSCOPY WITH RETROGRADE PYELOGRAM (Bilateral)  Patient Location: PACU  Anesthesia Type:General  Level of Consciousness: awake, drowsy and patient cooperative  Airway & Oxygen Therapy: Patient Spontanous Breathing and Patient connected to nasal cannula oxygen  Post-op Assessment: Report given to RN and Post -op Vital signs reviewed and stable  Post vital signs: Reviewed and stable  Last Vitals:  Vitals:   07/13/16 0858 07/13/16 1217  BP: (!) 155/100 (!) 146/89  Pulse:  97  Resp:  (!) 27  Temp:  36.6 C    Last Pain:  Vitals:   07/13/16 1217  TempSrc: Temporal  PainSc: 0-No pain         Complications: No apparent anesthesia complications

## 2016-07-13 NOTE — Interval H&P Note (Signed)
History and Physical Interval Note:  07/13/2016 9:49 AM  Dave Conrad  has presented today for surgery, with the diagnosis of RIGHT NEPHROLITHIASIS  The various methods of treatment have been discussed with the patient and family. After consideration of risks, benefits and other options for treatment, the patient has consented to  Procedure(s): CYSTOSCOPY/URETEROSCOPY/HOLMIUM LASER/STENT EXCHANGE (Right) as a surgical intervention .  The patient's history has been reviewed, patient examined, no change in status, stable for surgery.  I have reviewed the patient's chart and labs.  Questions were answered to the patient's satisfaction.    RRR CTAB  Vanna ScotlandAshley Clair Alfieri

## 2016-07-13 NOTE — Progress Notes (Signed)
Pharmacy consulted to dose Gentamicin for surgical prophylaxis on 41 yo male being admitted for cystoscopy/stent placement.  Order 5 mg/kg based on adjusted body wt of 85.5kg or 425mg  Gentamicin. Ardyth HarpsKaren Shazia Mitchener, RPh

## 2016-07-13 NOTE — Discharge Instructions (Signed)
You have a ureteral stent in place.  This is a tube that extends from your kidney to your bladder.  This may cause urinary bleeding, burning with urination, and urinary frequency.  Please call our office or present to the ED if you develop fevers >101 or pain which is not able to be controlled with oral pain medications.  You may be given either Flomax and/ or ditropan to help with bladder spasms and stent pain in addition to pain medications.    [facility] [address], [address] [place], [place] [address] [phone]   AMBULATORY SURGERY  DISCHARGE INSTRUCTIONS   1) The drugs that you were given will stay in your system until tomorrow so for the next 24 hours you should not:  A) Drive an automobile B) Make any legal decisions C) Drink any alcoholic beverage   2) You may resume regular meals tomorrow.  Today it is better to start with liquids and gradually work up to solid foods.  You may eat anything you prefer, but it is better to start with liquids, then soup and crackers, and gradually work up to solid foods.   3) Please notify your doctor immediately if you have any unusual bleeding, trouble breathing, redness and pain at the surgery site, drainage, fever, or pain not relieved by medication.    4) Additional Instructions: TAKE A STOOL SOFTENER TWICE A DAY WHILE TAKING NARCOTIC PAIN MEDICINE TO PREVENT CONSTIPATION   Please contact your physician with any problems or Same Day Surgery at [phone], Monday through Friday 6 am to 4 pm, or [place] [facility] number at [phone].   

## 2016-07-14 ENCOUNTER — Encounter: Payer: Self-pay | Admitting: Urology

## 2016-07-21 ENCOUNTER — Encounter: Payer: Self-pay | Admitting: Urology

## 2016-07-21 ENCOUNTER — Ambulatory Visit (INDEPENDENT_AMBULATORY_CARE_PROVIDER_SITE_OTHER): Payer: Managed Care, Other (non HMO) | Admitting: Urology

## 2016-07-21 VITALS — BP 169/99 | HR 96 | Ht 66.0 in | Wt 260.0 lb

## 2016-07-21 DIAGNOSIS — N200 Calculus of kidney: Secondary | ICD-10-CM

## 2016-07-21 LAB — URINALYSIS, COMPLETE
BILIRUBIN UA: NEGATIVE
Glucose, UA: NEGATIVE
KETONES UA: NEGATIVE
NITRITE UA: NEGATIVE
Protein, UA: NEGATIVE
SPEC GRAV UA: 1.02 (ref 1.005–1.030)
Urobilinogen, Ur: 0.2 mg/dL (ref 0.2–1.0)
pH, UA: 7 (ref 5.0–7.5)

## 2016-07-21 LAB — MICROSCOPIC EXAMINATION

## 2016-07-21 MED ORDER — CIPROFLOXACIN HCL 500 MG PO TABS
500.0000 mg | ORAL_TABLET | Freq: Once | ORAL | Status: AC
  Administered 2016-07-21: 500 mg via ORAL

## 2016-07-21 MED ORDER — LIDOCAINE HCL 2 % EX GEL
1.0000 "application " | Freq: Once | CUTANEOUS | Status: AC
  Administered 2016-07-21: 1 via URETHRAL

## 2016-07-21 NOTE — Progress Notes (Signed)
   07/21/16  CC:  Chief Complaint  Patient presents with  . Cysto Stent Removal    HPI: 41 year old male with left staghorn calculus status post PCNL on 06/22/2016 who returned for definitive management of residual stone burden on 07/13/2016. He returns to the office for cystoscopy, stent removal.  Overall, he has been doing well and tolerated the stent. No fevers or chills. No significant urinary symptoms. H  Of note today, he reports that his blood sugars have been on the lower side since treatment of his stone.  Blood pressure (!) 169/99, pulse 96, height 5\' 6"  (1.676 m), weight 260 lb (117.9 kg). NED. A&Ox3.   No respiratory distress   Abd soft, NT, ND Normal phallus with bilateral descended testicles  Cystoscopy/ Stent removal procedure  Patient identification was confirmed, informed consent was obtained, and patient was prepped using Betadine solution.  Lidocaine jelly was administered per urethral meatus.    Preoperative abx where received prior to procedure.    Procedure: - Flexible cystoscope introduced, without any difficulty.   - Thorough search of the bladder revealed:    normal urethral meatus  Stent seen emanating from right ureteral orifice, grasped with stent graspers, and removed in entirety.     Post-Procedure: - Patient tolerated the procedure well  Assessment/ Plan:  1. Right nephrolithiasis S/p uncomplicated right PCNL followed by staged ureteroscopy, cleared of stone burden Stent removed today without difficulty Warning signs reviewed Recommend follow-up in 4 weeks with KUB/renal ultrasound - Urinalysis, Complete - ciprofloxacin (CIPRO) tablet 500 mg; Take 1 tablet (500 mg total) by mouth once. - lidocaine (XYLOCAINE) 2 % jelly 1 application; Place 1 application into the urethra once. - US Renal; Future - DG Abd 1 View; Future  Return in about 4 weeks (around 08/18/2016) for RUS/ KUB.  Vanna ScotlandAshley Charlisa Cham, MD

## 2016-08-12 ENCOUNTER — Ambulatory Visit
Admission: RE | Admit: 2016-08-12 | Discharge: 2016-08-12 | Disposition: A | Discharge status: Home / Self Care | Payer: Managed Care, Other (non HMO) | Source: Ambulatory Visit | Attending: Urology | Admitting: Urology

## 2016-08-12 DIAGNOSIS — N200 Calculus of kidney: Secondary | ICD-10-CM | POA: Insufficient documentation

## 2016-08-18 ENCOUNTER — Ambulatory Visit (INDEPENDENT_AMBULATORY_CARE_PROVIDER_SITE_OTHER): Payer: Managed Care, Other (non HMO) | Admitting: Urology

## 2016-08-18 ENCOUNTER — Ambulatory Visit
Admission: RE | Admit: 2016-08-18 | Discharge: 2016-08-18 | Disposition: A | Discharge status: Home / Self Care | Payer: Managed Care, Other (non HMO) | Source: Ambulatory Visit | Attending: Urology | Admitting: Urology

## 2016-08-18 ENCOUNTER — Encounter: Payer: Self-pay | Admitting: Urology

## 2016-08-18 VITALS — BP 164/96 | HR 102 | Ht 66.0 in | Wt 260.0 lb

## 2016-08-18 DIAGNOSIS — N200 Calculus of kidney: Secondary | ICD-10-CM | POA: Diagnosis present

## 2016-08-18 DIAGNOSIS — Z87442 Personal history of urinary calculi: Secondary | ICD-10-CM

## 2016-09-06 NOTE — Progress Notes (Signed)
08/18/2016 2:48 PM   Dave Conrad 1975-05-15 [id number]  Referring provider: [facility] [address] [place], [place] [address]  Chief Complaint  Patient presents with  . Nephrolithiasis    RUS & KUB results    HPI: 41 year old male with left staghorn calculus status post PCNL on 06/22/2016 who returned for definitive management of residual stone burden on 07/13/2016.  She initially presented with a septic obstructing calculus the large staghorn stone.  Follow-up renal ultrasound and KUB showed no significant residual stone burden other than a calcification at the capsule of the kidney which likely is an embedded stone within the tract. No hydronephrosis.  He said well postoperatively. He has no voiding symptoms today. No gross hematuria. No urinary urgency or frequency. No flank pain. His incision is healed well.  Stone analysis consistent with 50% calcium phosphate, 40% magnesium ammonium phosphate, 10% ammonium urate.  He returns today for routine postop follow-up to review post op imaging.  PMH: Past Medical History:  Diagnosis Date  . Asplenia 05/08/2016  . Diabetes mellitus without complication (HCC)   . H/O sepsis 05/06/2016   due to right kidney stone  . History of kidney stones   . HIV (human immunodeficiency virus infection) (HCC)   . Hypertension     Surgical History: Past Surgical History:  Procedure Laterality Date  . CYSTOSCOPY W/ RETROGRADES Bilateral 07/13/2016   Procedure: CYSTOSCOPY WITH RETROGRADE PYELOGRAM;  Surgeon: Vanna Scotland, MD;  Location: [facility];  Service: Urology;  Laterality: Bilateral;  . CYSTOSCOPY WITH STENT PLACEMENT Right 05/09/2016   Procedure: CYSTOSCOPY WITH STENT PLACEMENT and retrorade pyelogram;  Surgeon: Crist Fat, MD;  Location: [facility];  Service: Urology;  Laterality: Right;  . CYSTOSCOPY WITH URETHRAL DILATATION N/A 06/22/2016   Procedure: CYSTOSCOPY WITH URETHRAL DILATATION;  Surgeon: Vanna Scotland,  MD;  Location: [facility];  Service: Urology;  Laterality: N/A;  . CYSTOSCOPY/URETEROSCOPY/HOLMIUM LASER/STENT PLACEMENT Right 07/13/2016   Procedure: CYSTOSCOPY/URETEROSCOPY/HOLMIUM LASER/STENT EXCHANGE;  Surgeon: Vanna Scotland, MD;  Location: [facility];  Service: Urology;  Laterality: Right;  . HOLMIUM LASER APPLICATION N/A 06/22/2016   Procedure: HOLMIUM LASER APPLICATION;  Surgeon: Vanna Scotland, MD;  Location: [facility];  Service: Urology;  Laterality: N/A;  . IR NEPHROSTOMY PLACEMENT RIGHT  06/22/2016  . NEPHROLITHOTOMY Right 06/22/2016   Procedure: NEPHROLITHOTOMY PERCUTANEOUS;  Surgeon: Vanna Scotland, MD;  Location: [facility];  Service: Urology;  Laterality: Right;  . SPLENECTOMY, TOTAL  1982  . URETEROSCOPY Right 06/22/2016   Procedure: ANTEGRADE URETEROSCOPY;  Surgeon: Vanna Scotland, MD;  Location: [facility];  Service: Urology;  Laterality: Right;    Home Medications:  Allergies as of 08/18/2016      Reactions   Shellfish Allergy Anaphylaxis   Penicillins Nausea And Vomiting   Has patient had a PCN reaction causing immediate rash, facial/tongue/throat swelling, SOB or lightheadedness with hypotension: yes rash Has patient had a PCN reaction causing severe rash involving mucus membranes or skin necrosis:no Has patient had a PCN reaction that required hospitalization no Has patient had a PCN reaction occurring within the last 10 years: yes If all of the above answers are "NO", then may proceed with Cephalosporin use.      Medication List       Accurate as of 08/18/16 11:59 PM. Always use your most recent med list.          aspirin EC 81 MG tablet Take 81 mg by mouth at bedtime.   CENTRUM ULTRA MENS PO Take 1  tablet by mouth daily.   diltiazem 180 MG 24 hr capsule Commonly known as:  DILACOR XR Take 180 mg by mouth daily.   efavirenz-emtricitabine-tenofovir 600-200-300 MG tablet Commonly known as:  ATRIPLA Take 1 tablet by mouth at bedtime.   glipiZIDE 10 MG 24 hr  tablet Commonly known as:  GLUCOTROL XL Take 5-10 mg by mouth 2 (two) times daily. 10 mg in the morning & 5 mg in the evening   lisinopril 40 MG tablet Commonly known as:  PRINIVIL,ZESTRIL Take 40 mg by mouth every evening.   metFORMIN 500 MG tablet Commonly known as:  GLUCOPHAGE Take 1,000 mg by mouth 2 (two) times daily with a meal.   pravastatin 20 MG tablet Commonly known as:  PRAVACHOL Take 20 mg by mouth every evening.   SAW PALMETTO PO Take 2 tablets by mouth 2 (two) times daily.   varenicline 0.5 MG X 11 & 1 MG X 42 tablet Commonly known as:  CHANTIX PAK Follow package directions.       Allergies:  Allergies  Allergen Reactions  . Shellfish Allergy Anaphylaxis  . Penicillins Nausea And Vomiting    Has patient had a PCN reaction causing immediate rash, facial/tongue/throat swelling, SOB or lightheadedness with hypotension: yes rash Has patient had a PCN reaction causing severe rash involving mucus membranes or skin necrosis:no Has patient had a PCN reaction that required hospitalization no Has patient had a PCN reaction occurring within the last 10 years: yes If all of the above answers are "NO", then may proceed with Cephalosporin use.     Family History: Family History  Problem Relation Age of Onset  . Breast cancer Mother   . Kidney cancer Neg Hx   . Kidney disease Neg Hx   . Prostate cancer Neg Hx     Social History:  reports that he quit smoking about 14 months ago. His smoking use included Cigarettes. He smoked 0.25 packs per day. He has never used smokeless tobacco. He reports that he does not drink alcohol or use drugs.  ROS: UROLOGY Frequent Urination?: No Hard to postpone urination?: No Burning/pain with urination?: No Get up at night to urinate?: No Leakage of urine?: No Urine stream starts and stops?: No Trouble starting stream?: No Do you have to strain to urinate?: No Blood in urine?: No Urinary tract infection?: No Sexually  transmitted disease?: No Injury to kidneys or bladder?: No Painful intercourse?: No Weak stream?: No Erection problems?: No Penile pain?: No  Gastrointestinal Nausea?: No Vomiting?: No Indigestion/heartburn?: No Diarrhea?: No Constipation?: No  Constitutional Fever: No Night sweats?: No Weight loss?: No Fatigue?: No  Skin Skin rash/lesions?: No  Eyes Double vision?: No  Ears/Nose/Throat Sore throat?: No Sinus problems?: No  Hematologic/Lymphatic Swollen glands?: No Easy bruising?: No  Cardiovascular Leg swelling?: No Chest pain?: No  Respiratory Cough?: No Shortness of breath?: No  Endocrine Excessive thirst?: No  Musculoskeletal Back pain?: No Joint pain?: No  Neurological Headaches?: No Dizziness?: No  Psychologic Depression?: No Anxiety?: No  Physical Exam: BP (!) 164/96   Pulse (!) 102   Ht 5\' 6"  (1.676 m)   Wt 260 lb (117.9 kg)   BMI 41.97 kg/m   Constitutional:  Alert and oriented, No acute distress. HEENT: [place] AT, moist mucus membranes.  Trachea midline, no masses. Cardiovascular: No clubbing, cyanosis, or edema. Respiratory: Normal respiratory effort, no increased work of breathing. GI: Abdomen is soft, nontender, nondistended, no abdominal masses GU: No CVA tenderness.  Right flank  incision well-healed. Skin: No rashes, bruises or suspicious lesions. Neurologic: Grossly intact, no focal deficits, moving all 4 extremities. Psychiatric: Normal mood and affect.  Laboratory Data: Lab Results  Component Value Date   WBC 12.2 (H) 06/23/2016   HGB 10.9 (L) 06/23/2016   HCT 33.1 (L) 06/23/2016   MCV 78.2 (L) 06/23/2016   PLT 184 06/23/2016    Lab Results  Component Value Date   CREATININE 0.95 06/23/2016    Lab Results  Component Value Date   HGBA1C 7.5 (H) 05/08/2016    Urinalysis N/a  Pertinent Imaging: CLINICAL DATA:  Renal stone disease.  06/22/2016.  CT 05/09/2016.  EXAM: RENAL / URINARY TRACT ULTRASOUND  COMPLETE  COMPARISON:  None.  FINDINGS: Right Kidney:  Length: 13.0 cm. Echogenicity within normal limits. No mass or hydronephrosis visualized. Previously identified prominent right staghorn calculus no longer identified. 8 mm lower pole renal stone noted.  Left Kidney:  Length: 13.1 cm. Echogenicity within normal limits. No mass or hydronephrosis visualized. Hypertrophy, per teen noted.  Bladder:  Bladder is nondistended. Bladder wall thickening cannot be excluded. Right ureteral jet noted. Left ureteral jet not identified.  IMPRESSION: 1. Previously identified large staghorn calculus right kidney no longer present. Small 8 mm nonobstructing stone right lower pole calyx.  2. Bladder is nondistended. Bladder wall thickening cannot be excluded. Right ureteral jet noted. Left ureteral jet not identified.   Electronically Signed   By: Maisie Fushomas  Register   On: 08/12/2016 11:46  CLINICAL DATA:  History of right-sided kidney stones, post lithotripsy 2 months ago  EXAM: ABDOMEN - 1 VIEW  COMPARISON:  None.  FINDINGS: Supine views of the abdomen show no bowel obstruction. A small right lower pole renal calculus may be present. On one view there is an oval area of slightly increased opacity overlying the mid upper right kidney. This may simply be due to a confluence of shadows, but either ultrasound or CT would be helpful to assess further. No definite left renal calculi are seen. The bones are unremarkable.  IMPRESSION: 1. Suspect small lower pole right renal calculus. 2. Cannot exclude larger faintly calcified calculus overlying the mid upper right kidney as noted above versus a confluence of shadows. Consider ultrasound or CT to assess further.   Electronically Signed   By: Dwyane DeePaul  Barry M.D.   On: 08/18/2016 13:36   Renal ultrasound and KUB personally reviewed today.  Assessment & Plan:    1. History of kidney stones Status post  uncomplicated right PCNL followed by staged ureteroscopy to clear residual stone burden  KUB/RUS personally reviewed today, suspect there is a small calculus within the tract of the PCNL tract but no significant treatable stone burden  Stone analysis reviewed We discussed general stone prevention techniques including drinking plenty water with goal of producing 2.5 L urine daily, increased citric acid intake, avoidance of high oxalate containing foods, and decreased salt intake.  Information about dietary recommendations given today.  Recommend f/u with KUB in 1 year or sooner as needed  - Abdomen 1 view (KUB); Future   Return in about 1 year (around 08/18/2017) for KUB.  Vanna ScotlandAshley Patrizia Paule, MD  [facility] [address], [address] [place], [place] [id number] [phone]

## 2017-08-18 ENCOUNTER — Ambulatory Visit
Admission: RE | Admit: 2017-08-18 | Discharge: 2017-08-18 | Disposition: A | Discharge status: Home / Self Care | Payer: Managed Care, Other (non HMO) | Source: Ambulatory Visit | Attending: Urology | Admitting: Urology

## 2017-08-18 DIAGNOSIS — Z09 Encounter for follow-up examination after completed treatment for conditions other than malignant neoplasm: Secondary | ICD-10-CM | POA: Diagnosis not present

## 2017-08-18 DIAGNOSIS — Z87442 Personal history of urinary calculi: Secondary | ICD-10-CM | POA: Diagnosis present

## 2017-08-19 ENCOUNTER — Ambulatory Visit (INDEPENDENT_AMBULATORY_CARE_PROVIDER_SITE_OTHER): Payer: Managed Care, Other (non HMO) | Admitting: Urology

## 2017-08-19 ENCOUNTER — Encounter: Payer: Self-pay | Admitting: Urology

## 2017-08-19 VITALS — BP 148/91 | HR 91 | Ht 67.0 in | Wt 280.0 lb

## 2017-08-19 DIAGNOSIS — Z87442 Personal history of urinary calculi: Secondary | ICD-10-CM | POA: Diagnosis not present

## 2017-08-19 NOTE — Progress Notes (Signed)
08/19/2017 9:24 AM   Dave Conrad 1975-10-15 [id number]  Referring provider: [facility] [address] [place], [place] [address]  Chief Complaint  Patient presents with  . Nephrolithiasis    42year    HPI: 42 yo with history of kidney stones who returns today for annual follow-up.  He has a personal history of a left staghorn calculus status post PCNL on 06/22/2016 who returned for definitive management of residual stone burden on 07/13/2016.  He initially presented with a septic obstructing calculus the large staghorn stone.  Stone analysis consistent with 50% calcium phosphate, 40% magnesium ammonium phosphate, 10% ammonium urate.  Over the past year, he is cut out a significant portion of meat in his diet.  He is watching what he eats.  He is cut out 90% of sodas.  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  He returns today for routine f/u.  KUB yesterday no stone burden.  PMH: Past Medical History:  Diagnosis Date  . Asplenia 05/08/2016  . Diabetes mellitus without complication (HCC)   . H/O sepsis 05/06/2016   due to right kidney stone  . History of kidney stones   . HIV (human immunodeficiency virus infection) (HCC)   . Hypertension     Surgical History: Past Surgical History:  Procedure Laterality Date  . CYSTOSCOPY W/ RETROGRADES Bilateral 07/13/2016   Procedure: CYSTOSCOPY WITH RETROGRADE PYELOGRAM;  Surgeon: Vanna Scotland, MD;  Location: [facility];  Service: Urology;  Laterality: Bilateral;  . CYSTOSCOPY WITH STENT PLACEMENT Right 05/09/2016   Procedure: CYSTOSCOPY WITH STENT PLACEMENT and retrorade pyelogram;  Surgeon: Crist Fat, MD;  Location: [facility];  Service: Urology;  Laterality: Right;  . CYSTOSCOPY WITH URETHRAL DILATATION N/A 06/22/2016   Procedure: CYSTOSCOPY WITH URETHRAL DILATATION;  Surgeon: Vanna Scotland, MD;   Location: [facility];  Service: Urology;  Laterality: N/A;  . CYSTOSCOPY/URETEROSCOPY/HOLMIUM LASER/STENT PLACEMENT Right 07/13/2016   Procedure: CYSTOSCOPY/URETEROSCOPY/HOLMIUM LASER/STENT EXCHANGE;  Surgeon: Vanna Scotland, MD;  Location: [facility];  Service: Urology;  Laterality: Right;  . HOLMIUM LASER APPLICATION N/A 06/22/2016   Procedure: HOLMIUM LASER APPLICATION;  Surgeon: Vanna Scotland, MD;  Location: [facility];  Service: Urology;  Laterality: N/A;  . IR NEPHROSTOMY PLACEMENT RIGHT  06/22/2016  . NEPHROLITHOTOMY Right 06/22/2016   Procedure: NEPHROLITHOTOMY PERCUTANEOUS;  Surgeon: Vanna Scotland, MD;  Location: [facility];  Service: Urology;  Laterality: Right;  . SPLENECTOMY, TOTAL  1982  . URETEROSCOPY Right 06/22/2016   Procedure: ANTEGRADE URETEROSCOPY;  Surgeon: Vanna Scotland, MD;  Location: [facility];  Service: Urology;  Laterality: Right;    Home Medications:  Allergies as of 08/19/2017      Reactions   Shellfish Allergy Anaphylaxis   Penicillins Nausea And Vomiting   Has patient had a PCN reaction causing immediate rash, facial/tongue/throat swelling, SOB or lightheadedness with hypotension: yes rash Has patient had a PCN reaction causing severe rash involving mucus membranes or skin necrosis:no Has patient had a PCN reaction that required hospitalization no Has patient had a PCN reaction occurring within the last 10 years: yes If all of the above answers are "NO", then may proceed with Cephalosporin use.      Medication List        Accurate as of 08/19/17  9:24 AM. Always use your most recent med list.          aspirin EC 81 MG  tablet Take 81 mg by mouth at bedtime.   CENTRUM ULTRA MENS PO Take 1 tablet by mouth daily.   diltiazem 180 MG 24 hr capsule Commonly known as:  DILACOR XR Take 180 mg by mouth daily.   efavirenz-emtricitabine-tenofovir 600-200-300 MG tablet Commonly known as:  ATRIPLA Take 1 tablet by mouth at bedtime.   glipiZIDE 10 MG 24 hr  tablet Commonly known as:  GLUCOTROL XL Take 5-10 mg by mouth 2 (two) times daily. 10 mg in the morning & 5 mg in the evening   lisinopril 40 MG tablet Commonly known as:  PRINIVIL,ZESTRIL Take 40 mg by mouth daily.   metFORMIN 500 MG tablet Commonly known as:  GLUCOPHAGE Take 1,000 mg by mouth 2 (two) times daily with a meal.   pravastatin 20 MG tablet Commonly known as:  PRAVACHOL Take 20 mg by mouth every evening.       Allergies:  Allergies  Allergen Reactions  . Shellfish Allergy Anaphylaxis  . Penicillins Nausea And Vomiting    Has patient had a PCN reaction causing immediate rash, facial/tongue/throat swelling, SOB or lightheadedness with hypotension: yes rash Has patient had a PCN reaction causing severe rash involving mucus membranes or skin necrosis:no Has patient had a PCN reaction that required hospitalization no Has patient had a PCN reaction occurring within the last 10 years: yes If all of the above answers are "NO", then may proceed with Cephalosporin use.     Family History: Family History  Problem Relation Age of Onset  . Breast cancer Mother   . Kidney cancer Neg Hx   . Kidney disease Neg Hx   . Prostate cancer Neg Hx     Social History:  reports that he quit smoking about 2 years ago. His smoking use included cigarettes. He smoked 0.25 packs per day. He has never used smokeless tobacco. He reports that he does not drink alcohol or use drugs.  ROS: UROLOGY Frequent Urination?: No Hard to postpone urination?: No Burning/pain with urination?: No Get up at night to urinate?: No Leakage of urine?: No Urine stream starts and stops?: No Trouble starting stream?: No Do you have to strain to urinate?: No Blood in urine?: No Urinary tract infection?: No Sexually transmitted disease?: No Injury to kidneys or bladder?: No Painful intercourse?: No Weak stream?: No Erection problems?: No Penile pain?: No  Gastrointestinal Nausea?: No Vomiting?:  No Indigestion/heartburn?: No Diarrhea?: No Constipation?: No  Constitutional Fever: No Night sweats?: No Weight loss?: No Fatigue?: No  Skin Skin rash/lesions?: No Itching?: No  Eyes Blurred vision?: No Double vision?: No  Ears/Nose/Throat Sore throat?: No Sinus problems?: No  Hematologic/Lymphatic Swollen glands?: No Easy bruising?: No  Cardiovascular Leg swelling?: No Chest pain?: No  Respiratory Cough?: No Shortness of breath?: No  Endocrine Excessive thirst?: No  Musculoskeletal Back pain?: No Joint pain?: No  Neurological Headaches?: No Dizziness?: No  Psychologic Depression?: No Anxiety?: No  Physical Exam: BP (!) 148/91   Pulse 91   Ht 5\' 7"  (1.702 m)   Wt 280 lb (127 kg)   BMI 43.85 kg/m   Constitutional:  Alert and oriented, No acute distress. HEENT: [place] AT, moist mucus membranes.  Trachea midline, no masses. Cardiovascular: No clubbing, cyanosis, or edema. Respiratory: Normal respiratory effort, no increased work of breathing. GI: Abdomen is soft, nontender, nondistended, obese Skin: No rashes, bruises or suspicious lesions. Neurologic: Grossly intact, no focal deficits, moving all 4 extremities. Psychiatric: Normal mood and affect.  Laboratory Data: Lab  Results  Component Value Date   WBC 12.2 (H) 06/23/2016   HGB 10.9 (L) 06/23/2016   HCT 33.1 (L) 06/23/2016   MCV 78.2 (L) 06/23/2016   PLT 184 06/23/2016    Lab Results  Component Value Date   CREATININE 0.95 06/23/2016    Lab Results  Component Value Date   HGBA1C 7.5 (H) 05/08/2016    Urinalysis N/a  Pertinent Imaging: Results for orders placed during the hospital encounter of 08/18/17  Abdomen 1 view (KUB)   Narrative CLINICAL DATA:  Yearly follow-up for right-sided kidney stone.  EXAM: ABDOMEN - 1 VIEW  COMPARISON:  08/18/2016  FINDINGS: Larina Bras previously seen overlying the right kidney can no longer be visualized. No stone along the course of either  ureter. No stone in the bladder. Multiple phleboliths in the pelvis appear similar to previous studies.  IMPRESSION: No visible urinary tract stones on today's exam.   Electronically Signed   By: Paulina Fusi M.D.   On: 08/18/2017 15:37    KUB personally reviewed today and with the patient.  Compared to previous KUB.    Assessment & Plan:    1. History of kidney stones Currently stone free without stone episodes over the past year Reviewed stone prevention techniques He may follow-up at this point as needed  F/u prn  Vanna Scotland, MD  [facility] [address], [address] [place], [place] [address] [phone]

## 2017-08-20 ENCOUNTER — Ambulatory Visit: Payer: Managed Care, Other (non HMO) | Admitting: Urology

## 2018-03-17 DIAGNOSIS — K5100 Ulcerative (chronic) pancolitis without complications: Secondary | ICD-10-CM | POA: Insufficient documentation

## 2018-05-24 IMAGING — DX DG CHEST 1V PORT
1 series · 1 of 1 positions shown · non-contrast
Comparison: None.

CLINICAL DATA: 40-year-old male with shortness of breath and fever.

EXAM:
PORTABLE CHEST 1 VIEW

[chest ap]
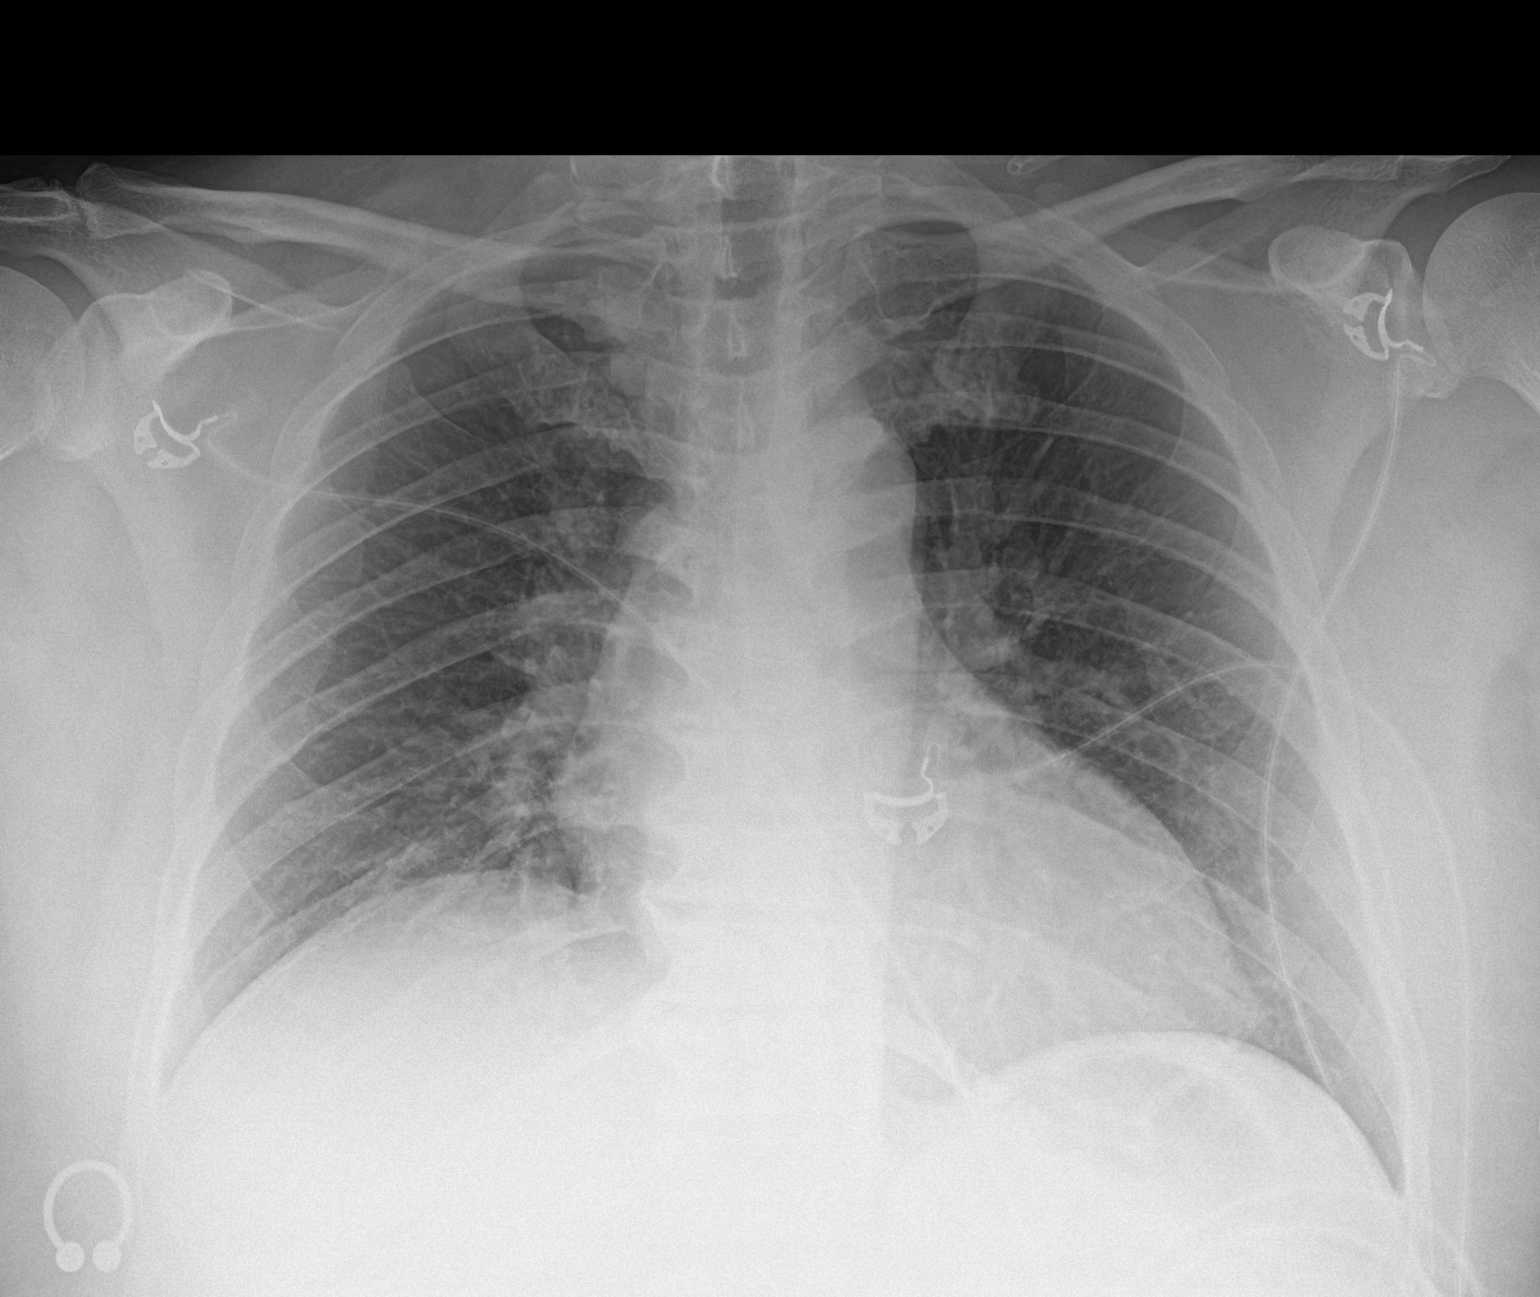

[1 of 1 positions shown; findings below may reference images not displayed]

FINDINGS: The heart size and mediastinal contours are within normal limits.
Both lungs are clear. The visualized skeletal structures are
unremarkable.
IMPRESSION: No active disease.

## 2018-05-25 IMAGING — CR DG CHEST 2V
1 series · 2 of 2 positions shown · non-contrast
Comparison: None.

CLINICAL DATA: Shortness of breath and weakness

EXAM:
CHEST  2 VIEW

[Series 1: dg chest 2 view · 0.14mm/px · 2 of 2 slices shown]
[im 1/2]
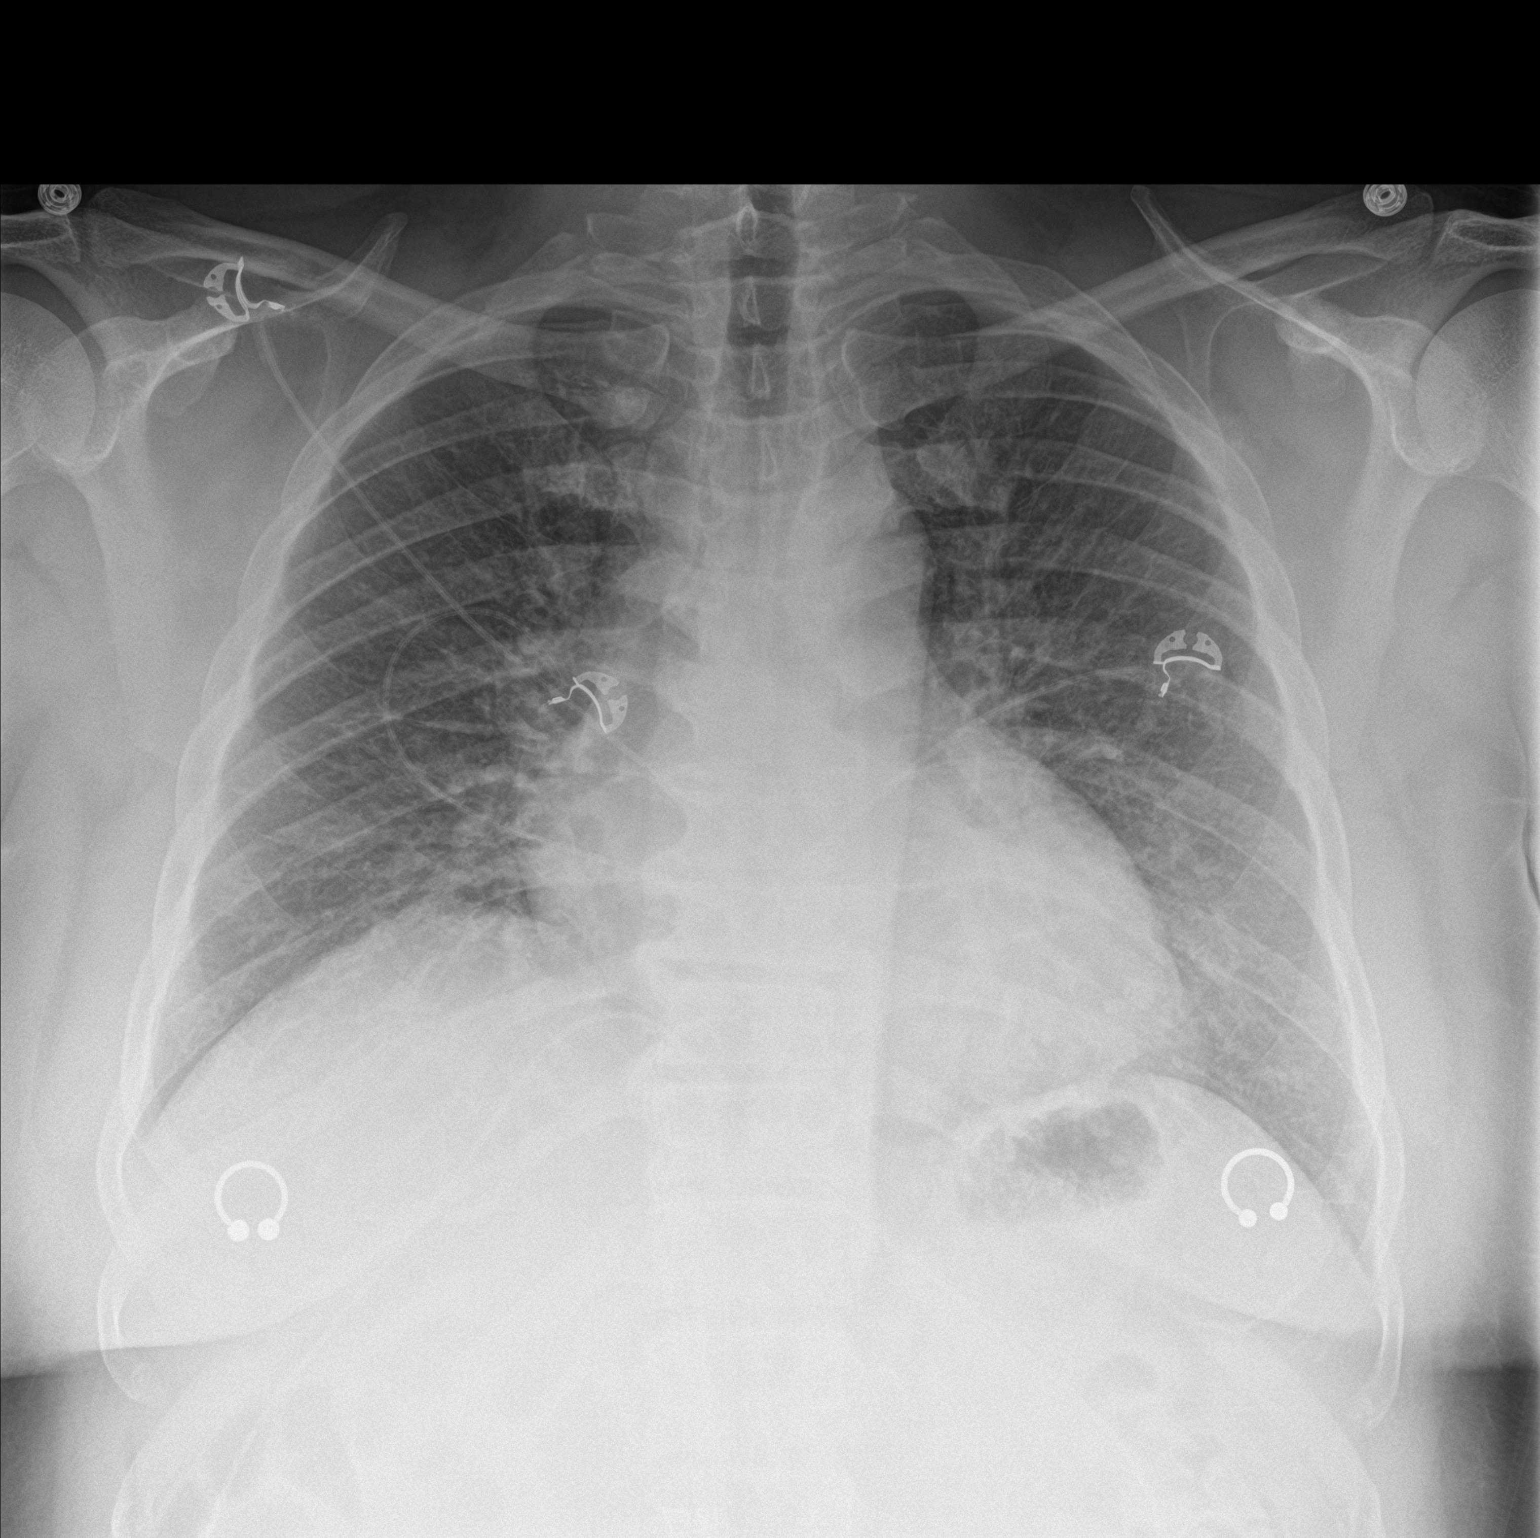
[im 2/2]
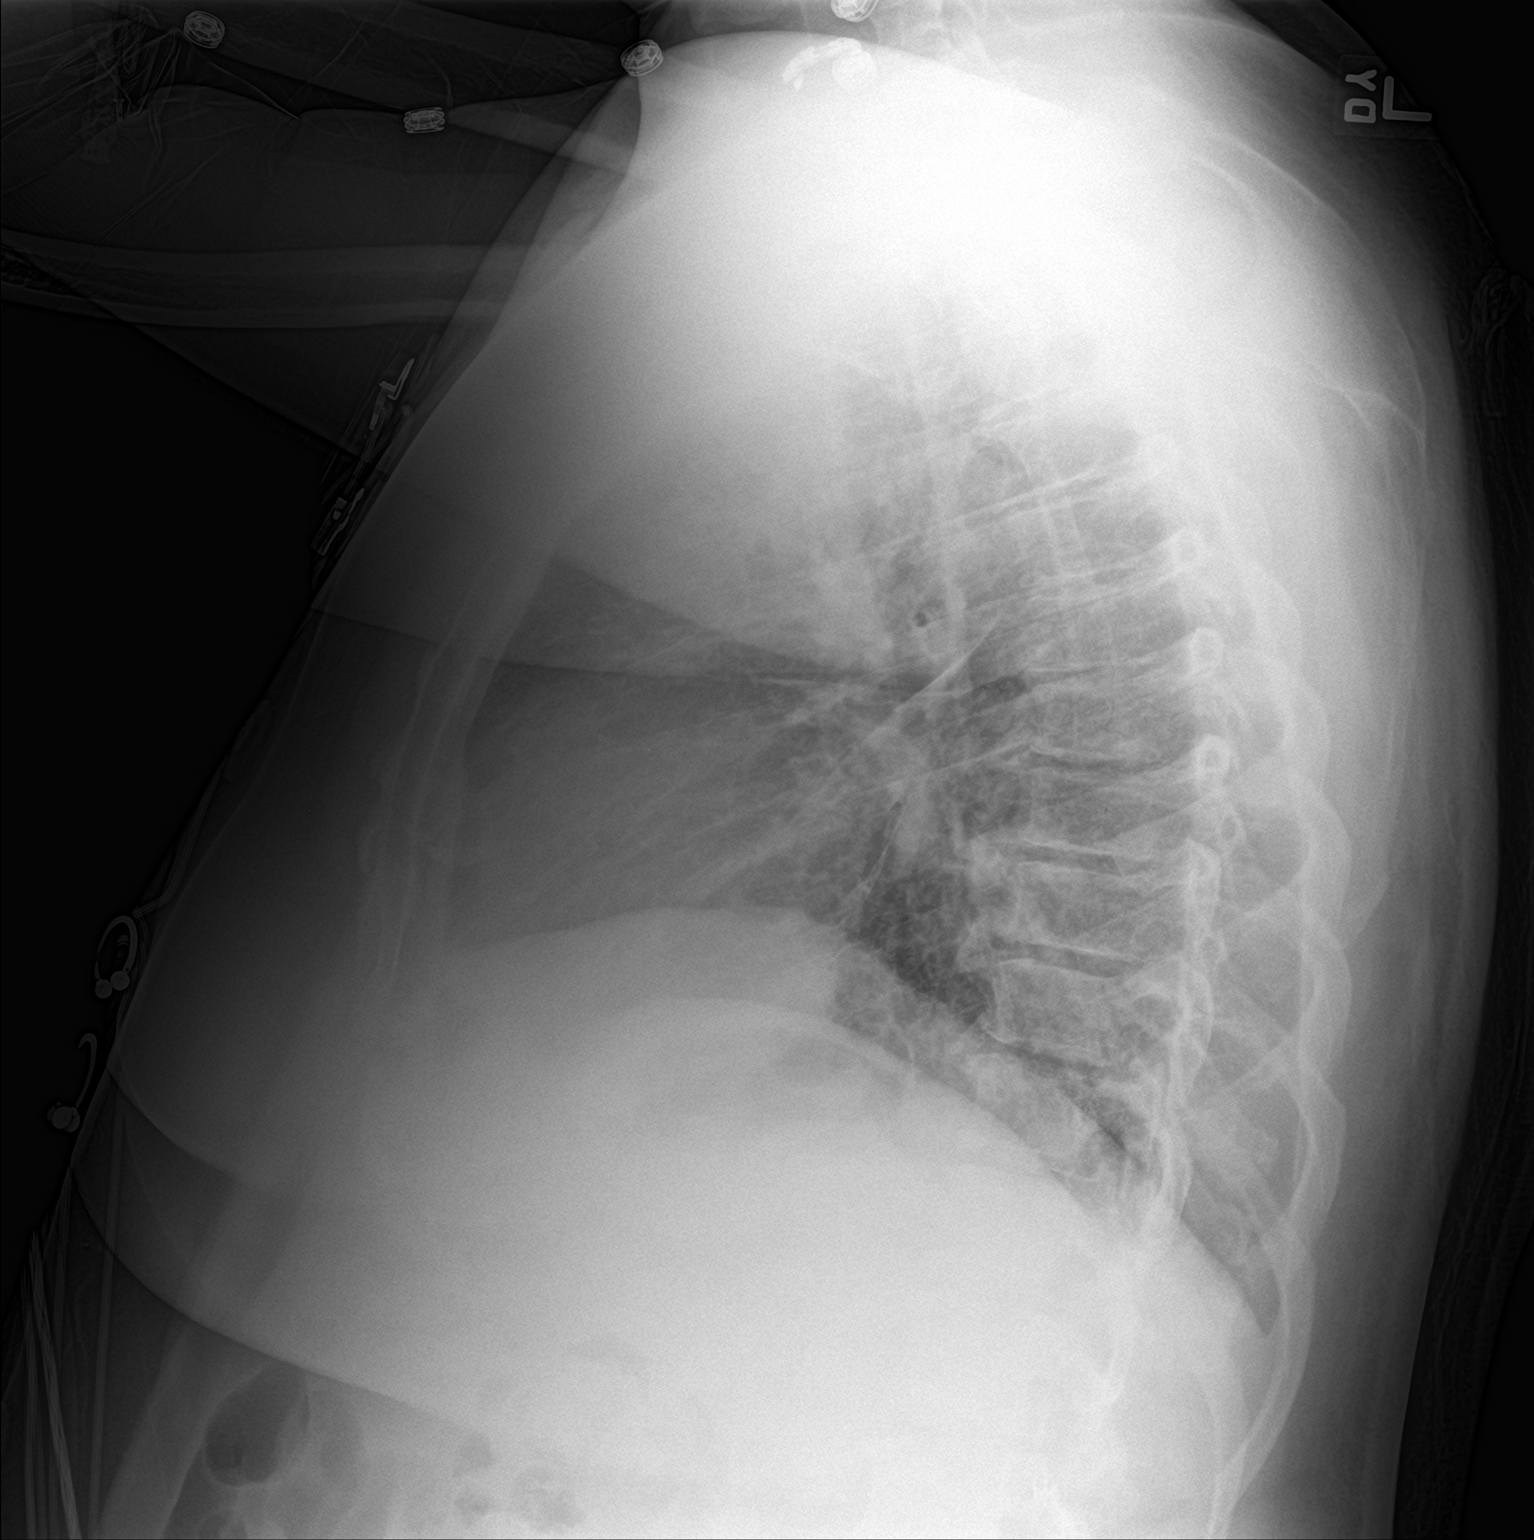

[2 of 2 positions shown; findings below may reference images not displayed]

FINDINGS: Cardiac shadow is within normal limits. The lungs are well aerated
bilaterally. No focal infiltrate or sizable effusion is seen.
Degenerative changes of thoracic spine are noted.
IMPRESSION: No active cardiopulmonary disease.

## 2018-08-16 ENCOUNTER — Telehealth (INDEPENDENT_AMBULATORY_CARE_PROVIDER_SITE_OTHER): Payer: Managed Care, Other (non HMO) | Admitting: Urology

## 2018-08-16 ENCOUNTER — Telehealth: Payer: Self-pay | Admitting: Urology

## 2018-08-16 ENCOUNTER — Other Ambulatory Visit: Payer: Self-pay

## 2018-08-16 DIAGNOSIS — N529 Male erectile dysfunction, unspecified: Secondary | ICD-10-CM

## 2018-08-16 MED ORDER — TADALAFIL 5 MG PO TABS: 5.0000 mg | ORAL_TABLET | Freq: Every day | ORAL | 0 refills | Status: DC | PRN

## 2018-08-16 NOTE — Telephone Encounter (Signed)
Would you call Dave Conrad and schedule him for a one month follow up for a SHIM and office visit?

## 2018-08-16 NOTE — Progress Notes (Signed)
Virtual Visit via Video Note  I connected with Dave Conrad on 08/16/18 at  9:30 AM EDT by a video enabled telemedicine application and verified that I am speaking with the correct person using two identifiers.  Location: Patient: Home Provider: Home   I discussed the limitations of evaluation and management by telemedicine and the availability of in person appointments. The patient expressed understanding and agreed to proceed.  History of Present Illness: Dave Conrad is a 43 year male who is HIV + and has a history of nephrolithiasis who is contacted today via [email or web address] to discuss erectile dysfunction issues.    He has been having difficulty with erections since 2018.  His major complaint is maintaining erections.  His libido is preserved.   His risk factors for ED are age, DM and HTN.  He denies any painful erections or curvatures with his erections.   He is still having an occasional spontaneous erection.  He has not seen anyone for this condition in the past.  He has not tried anything for his ED in the past.  His PSA was 0.24 on 09/24/2017.  His testosterone was 380 on 04/01/2018.  He was tested for sleep apnea five years ago and was found not to have sleep apnea.  He currently denies any snoring or apneic events.   His last Hemoglobin A1c was 10.8% on 07/21/2018.  His lipid profile was normal on 12/30/2017.    He is exercising regularly by waking and/or using an exercise bike daily.  He does not have any chest pain or shortness of breath.  He does not take products with nitroglycerin.    Observations/Objective: Dave Conrad is appropriately dressed and well groomed.  He is answering questions appropriately.    Assessment and Plan:  1. ED He would like to try the tadalafil 5 mg daily as he wants to keep his sex life as spontaneous as possible which is appropriate I warned him not to take the tadalafil with medications that contain nitrates.  I also advised him of the side effects, such as:  headache, flushing, dyspepsia, abnormal vision, nasal congestion, back pain, myalgia, nausea, dizziness, and rash. Cialis (tadalafil) 5mg  daily, #90 to [facility]  Follow Up Instructions:  Dave Conrad will return in one month for SHIM score, thyroid function test and exam.   I discussed the assessment and treatment plan with the patient. The patient was provided an opportunity to ask questions and all were answered. The patient agreed with the plan and demonstrated an understanding of the instructions.   The patient was advised to call back or seek an in-person evaluation if the symptoms worsen or if the condition fails to improve as anticipated.  I provided 16 minutes of non-face-to-face time during this encounter.   Ryle Buscemi, PA-C

## 2018-09-26 NOTE — Progress Notes (Deleted)
09/27/2018 12:12 PM   Brailyn Joneen Caraway May 02, 1975 [id number]  Referring provider: [facility] [place],  [place] [address]  No chief complaint on file.   HPI: Mr. Fedora is a 43 year old male with a history of a staghorn calculi and ED who presents today for a follow up.  History of staghorn calculi left staghorn calculus status post PCNL on 06/22/2016 who returned for definitive management of residual stone burden on 07/13/2016.  Follow-up renal ultrasound and KUB showed no significant residual stone burden other than a calcification at the capsule of the kidney which likely is an embedded stone within the tract. No hydronephrosis.  Stone analysis consistent with 50% calcium phosphate, 40% magnesium ammonium phosphate, 10% ammonium urate.  Erectile dysfunction His SHIM score is ***, which is ***.   His previous SHIM score was ***.  He has been having difficulty with erections for ***.   His major complaint is ***.  His libido is ***.   His risk factors for ED are age, BPH and DM.  He denies any painful erections or curvatures with his erections.   He is still having/no longer having spontaneous erections.  He has tried PDE5i in the past.       Score: 1-7 Severe ED 8-11 Moderate ED 12-16 Mild-Moderate ED 17-21 Mild ED 22-25 No ED  PMH: Past Medical History:  Diagnosis Date  . Asplenia 05/08/2016  . Diabetes mellitus without complication ([place])   . H/O sepsis 05/06/2016   due to right kidney stone  . History of kidney stones   . HIV (human immunodeficiency virus infection) ([place])   . Hypertension     Surgical History: Past Surgical History:  Procedure Laterality Date  . CYSTOSCOPY W/ RETROGRADES Bilateral 07/13/2016   Procedure: CYSTOSCOPY WITH RETROGRADE PYELOGRAM;  Surgeon: Hollice Espy, MD;  Location: [facility];  Service: Urology;  Laterality: Bilateral;  . CYSTOSCOPY WITH STENT PLACEMENT Right 05/09/2016   Procedure: CYSTOSCOPY WITH STENT  PLACEMENT and retrorade pyelogram;  Surgeon: Ardis Hughs, MD;  Location: [facility];  Service: Urology;  Laterality: Right;  . CYSTOSCOPY WITH URETHRAL DILATATION N/A 06/22/2016   Procedure: CYSTOSCOPY WITH URETHRAL DILATATION;  Surgeon: Hollice Espy, MD;  Location: [facility];  Service: Urology;  Laterality: N/A;  . CYSTOSCOPY/URETEROSCOPY/HOLMIUM LASER/STENT PLACEMENT Right 07/13/2016   Procedure: CYSTOSCOPY/URETEROSCOPY/HOLMIUM LASER/STENT EXCHANGE;  Surgeon: Hollice Espy, MD;  Location: [facility];  Service: Urology;  Laterality: Right;  . HOLMIUM LASER APPLICATION N/A 5/62/1308   Procedure: HOLMIUM LASER APPLICATION;  Surgeon: Hollice Espy, MD;  Location: [facility];  Service: Urology;  Laterality: N/A;  . IR NEPHROSTOMY PLACEMENT RIGHT  06/22/2016  . NEPHROLITHOTOMY Right 06/22/2016   Procedure: NEPHROLITHOTOMY PERCUTANEOUS;  Surgeon: Hollice Espy, MD;  Location: [facility];  Service: Urology;  Laterality: Right;  . SPLENECTOMY, TOTAL  1982  . URETEROSCOPY Right 06/22/2016   Procedure: ANTEGRADE URETEROSCOPY;  Surgeon: Hollice Espy, MD;  Location: [facility];  Service: Urology;  Laterality: Right;    Home Medications:  Allergies as of 09/27/2018      Reactions   Shellfish Allergy Anaphylaxis   Penicillins Nausea And Vomiting   Has patient had a PCN reaction causing immediate rash, facial/tongue/throat swelling, SOB or lightheadedness with hypotension: yes rash Has patient had a PCN reaction causing severe rash involving mucus membranes or skin necrosis:no Has patient had a PCN reaction that required hospitalization no Has patient had a PCN reaction occurring within the last 10 years: yes If all  of the above answers are "NO", then may proceed with Cephalosporin use.      Medication List       Accurate as of September 26, 2018 12:12 PM. If you have any questions, ask your nurse or doctor.        aspirin EC 81 MG tablet Take 81 mg by mouth at bedtime.   Biktarvy 50-200-25 MG Tabs  tablet Generic drug: bictegravir-emtricitabine-tenofovir AF TAKE 1 TABLET BY MOUTH ONCE DAILY IN PLACE OF ATRIPLA   CENTRUM ULTRA MENS PO Take 1 tablet by mouth daily.   diltiazem 180 MG 24 hr capsule Commonly known as: DILACOR XR Take 180 mg by mouth daily.   efavirenz-emtricitabine-tenofovir 600-200-300 MG tablet Commonly known as: ATRIPLA Take 1 tablet by mouth at bedtime.   glipiZIDE 10 MG 24 hr tablet Commonly known as: GLUCOTROL XL Take 5-10 mg by mouth 2 (two) times daily. 10 mg in the morning & 5 mg in the evening   lisinopril 40 MG tablet Commonly known as: ZESTRIL Take 40 mg by mouth daily.   metFORMIN 500 MG tablet Commonly known as: GLUCOPHAGE Take 1,000 mg by mouth 2 (two) times daily with a meal.   pravastatin 20 MG tablet Commonly known as: PRAVACHOL Take 20 mg by mouth every evening.   tadalafil 5 MG tablet Commonly known as: CIALIS Take 1 tablet (5 mg total) by mouth daily as needed for erectile dysfunction.       Allergies:  Allergies  Allergen Reactions  . Shellfish Allergy Anaphylaxis  . Penicillins Nausea And Vomiting    Has patient had a PCN reaction causing immediate rash, facial/tongue/throat swelling, SOB or lightheadedness with hypotension: yes rash Has patient had a PCN reaction causing severe rash involving mucus membranes or skin necrosis:no Has patient had a PCN reaction that required hospitalization no Has patient had a PCN reaction occurring within the last 10 years: yes If all of the above answers are "NO", then may proceed with Cephalosporin use.     Family History: Family History  Problem Relation Age of Onset  . Breast cancer Mother   . Kidney cancer Neg Hx   . Kidney disease Neg Hx   . Prostate cancer Neg Hx     Social History:  reports that he quit smoking about 3 years ago. His smoking use included cigarettes. He smoked 0.25 packs per day. He has never used smokeless tobacco. He reports that he does not drink alcohol  or use drugs.  ROS:                                        Physical Exam: There were no vitals taken for this visit.  Constitutional:  Well nourished. Alert and oriented, No acute distress. HEENT: [place] AT, moist mucus membranes.  Trachea midline, no masses. Cardiovascular: No clubbing, cyanosis, or edema. Respiratory: Normal respiratory effort, no increased work of breathing. GI: Abdomen is soft, non tender, non distended, no abdominal masses. Liver and spleen not palpable.  No hernias appreciated.  Stool sample for occult testing is not indicated.   GU: No CVA tenderness.  No bladder fullness or masses.  Patient with circumcised/uncircumcised phallus. ***Foreskin easily retracted***  Urethral meatus is patent.  No penile discharge. No penile lesions or rashes. Scrotum without lesions, cysts, rashes and/or edema.  Testicles are located scrotally bilaterally. No masses are appreciated in the testicles. Left and right  epididymis are normal. Rectal: Patient with  normal sphincter tone. Anus and perineum without scarring or rashes. No rectal masses are appreciated. Prostate is approximately *** grams, *** nodules are appreciated. Seminal vesicles are normal. Skin: No rashes, bruises or suspicious lesions. Lymph: No cervical or inguinal adenopathy. Neurologic: Grossly intact, no focal deficits, moving all 4 extremities. Psychiatric: Normal mood and affect.  Laboratory Data: Lab Results  Component Value Date   WBC 12.2 (H) 06/23/2016   HGB 10.9 (L) 06/23/2016   HCT 33.1 (L) 06/23/2016   MCV 78.2 (L) 06/23/2016   PLT 184 06/23/2016    Lab Results  Component Value Date   CREATININE 0.95 06/23/2016    No results found for: PSA  No results found for: TESTOSTERONE  Lab Results  Component Value Date   HGBA1C 7.5 (H) 05/08/2016    No results found for: TSH  No results found for: CHOL, HDL, CHOLHDL, VLDL, LDLCALC  Lab Results  Component Value Date   AST 45 (H)  05/08/2016   Lab Results  Component Value Date   ALT 32 05/08/2016   No components found for: ALKALINEPHOPHATASE No components found for: BILIRUBINTOTAL  No results found for: ESTRADIOL  Urinalysis    Component Value Date/Time   COLORURINE AMBER (A) 05/07/2016 1259   APPEARANCEUR Clear 07/21/2016 1505   LABSPEC 1.033 (H) 05/07/2016 1259   PHURINE 5.0 05/07/2016 1259   GLUCOSEU Negative 07/21/2016 1505   HGBUR MODERATE (A) 05/07/2016 1259   BILIRUBINUR Negative 07/21/2016 1505   KETONESUR 80 (A) 05/07/2016 1259   PROTEINUR Negative 07/21/2016 1505   PROTEINUR 100 (A) 05/07/2016 1259   NITRITE Negative 07/21/2016 1505   NITRITE NEGATIVE 05/07/2016 1259   LEUKOCYTESUR 2+ (A) 07/21/2016 1505    I have reviewed the labs.   Pertinent Imaging: *** I have independently reviewed the films.    Assessment & Plan:  ***  Erectile dysfunction  - SHIM score is ***, it is stable, worsening, improving  - I explained to the patient that in order to achieve an erection it takes good functioning of the nervous system (parasympathetic and rs, sympathetic, sensory and motor), good blood flow into the erectile tissue of the penis and a desire to have sex  - I explained that conditions like diabetes, hypertension, coronary artery disease, peripheral vascular disease, smoking, alcohol consumption, age, sleep apnea and BPH can diminish the ability to have an erection  - I explained the ED may be a risk marker for underlying CVD and he should follow up with PCP for further studies ***  - ED is an impairment in the arousal phase of the sexual response cycle.  Desire, orgasm and resolution are not considered ED and should be approached as a separate issue  - we will obtain a serum testosterone level at this time; if it is abnormal we will need to repeat the study for confirmation  - A recent study published in Sex Med 2018 Apr 13 revealed moderate to vigorous aerobic exercise for 40 minutes 4 times  per week can decrease erectile problems caused by physical inactivity, obesity, hypertension, metabolic syndrome and/or cardiovascular diseases  - We discussed trying a *** different PDE5 inhibitor, intra-urethral suppositories, intracavernous vasoactive drug injection therapy, vacuum constriction device and penile prosthesis implantation  - He would like to try ***  - Continue Cialis, Viagra, Levitra, Stendra  - RTC in *** months for repeat SHIM score and exam    No follow-ups on file.  These notes generated with  voice recognition software. I apologize for typographical errors.  Zara Council, PA-C  [facility] [address]  [place], [place] [address] [phone]

## 2018-09-27 ENCOUNTER — Ambulatory Visit: Payer: Managed Care, Other (non HMO) | Admitting: Urology

## 2018-09-27 ENCOUNTER — Encounter: Payer: Self-pay | Admitting: Urology

## 2018-10-02 IMAGING — US US EXTREM LOW VENOUS BILAT
1 series · 13 of 24 positions shown · non-contrast
Comparison: None.

CLINICAL DATA: Bilateral lower extremity edema. Hypoxemia with
respiratory failure. Evaluate for DVT.



[Series 1: us extrem low venous bilat · 0.07mm/px · 13 of 72 slices shown]
[im 1/72]
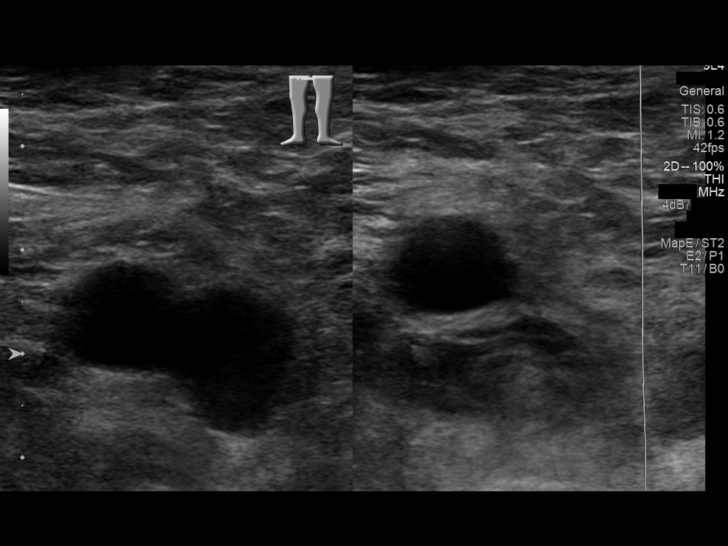
[im 7/72]
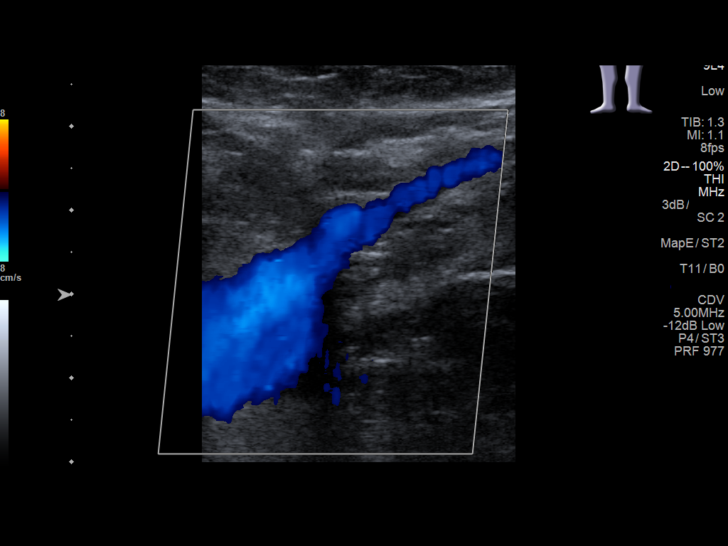
[im 13/72]
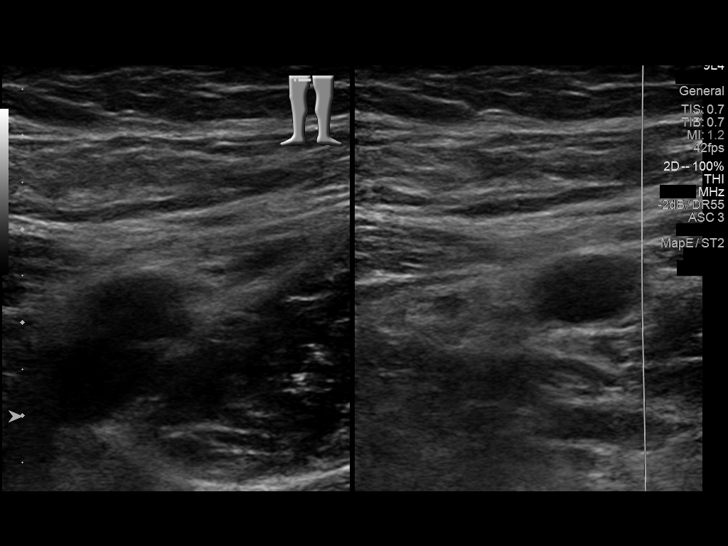
[im 19/72]
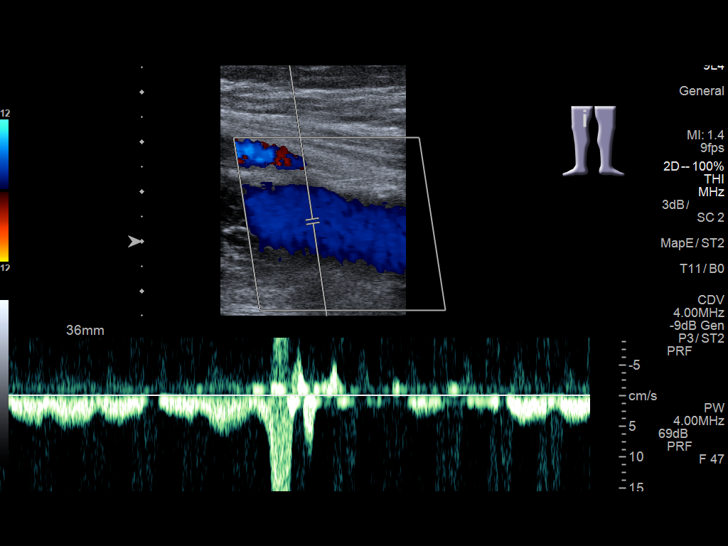
[im 25/72]
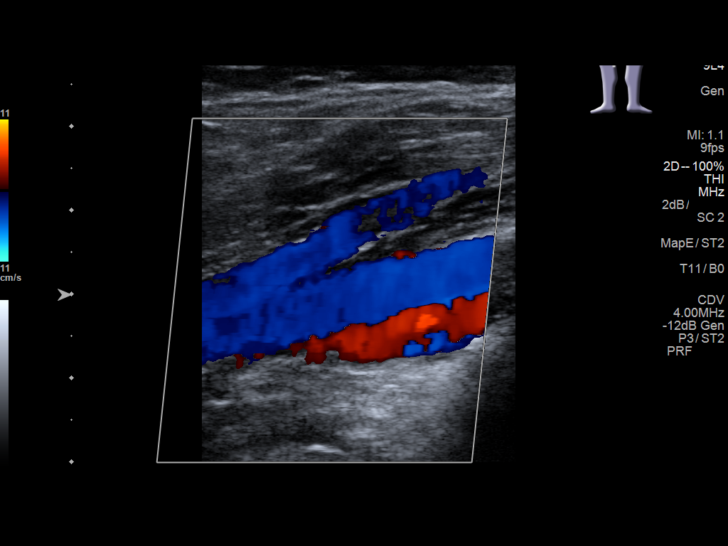
[im 31/72]
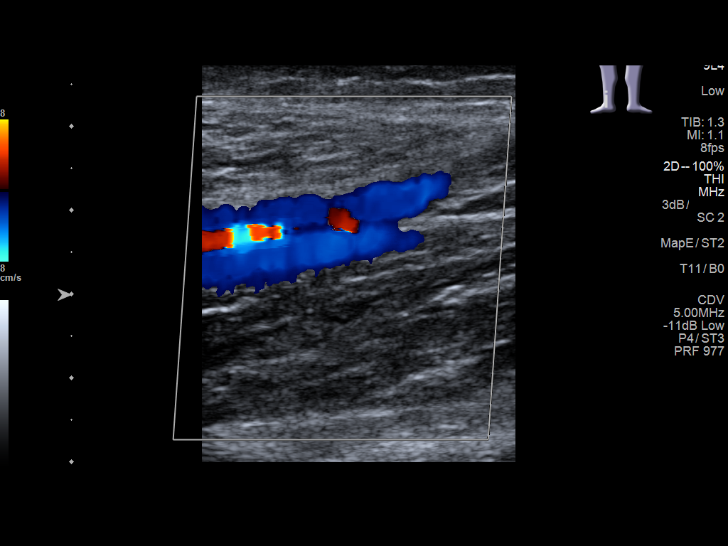
[im 38/72]
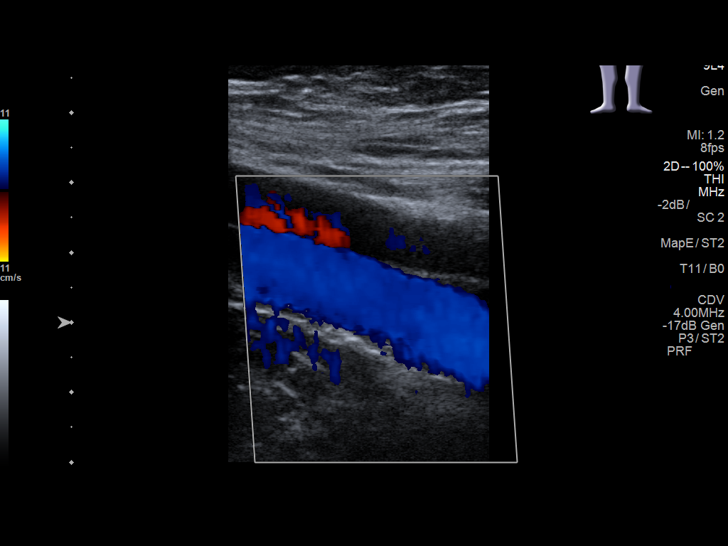
[im 41/72]
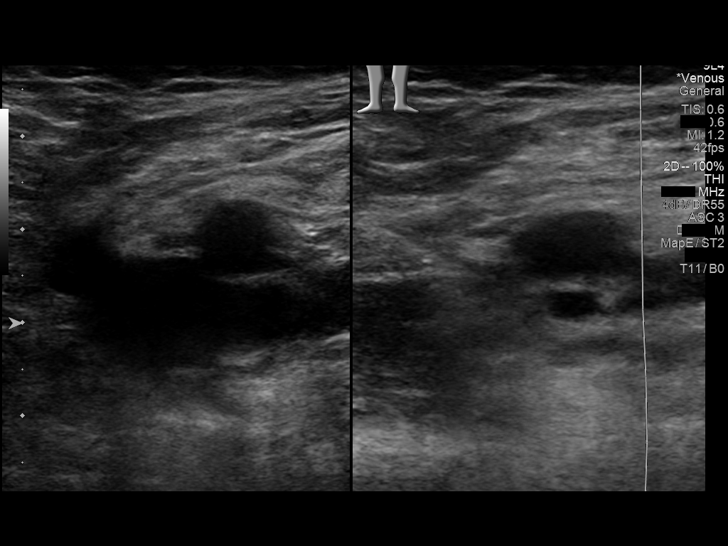
[im 47/72]
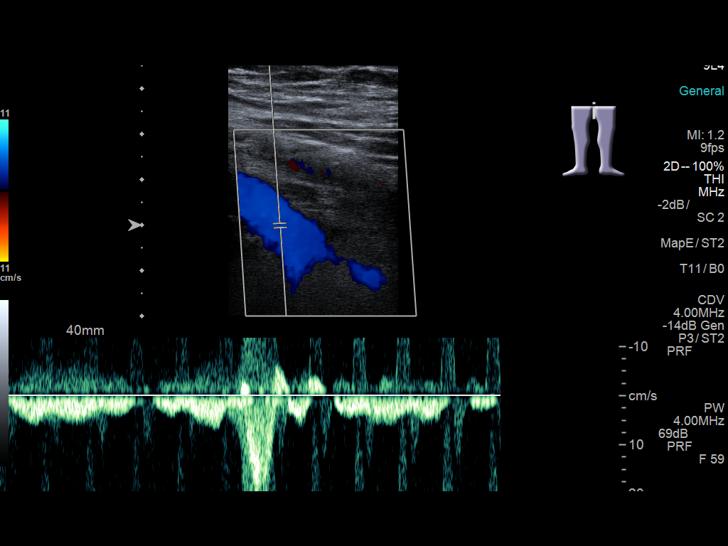
[im 53/72]
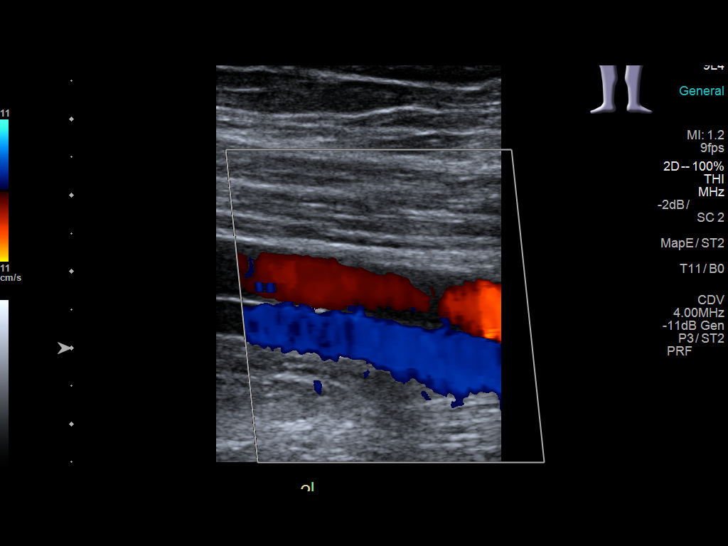
[im 59/72]
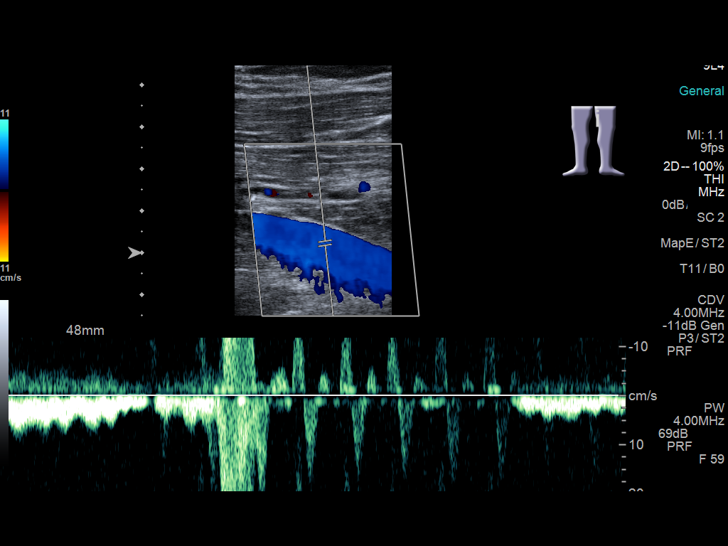
[im 65/72]
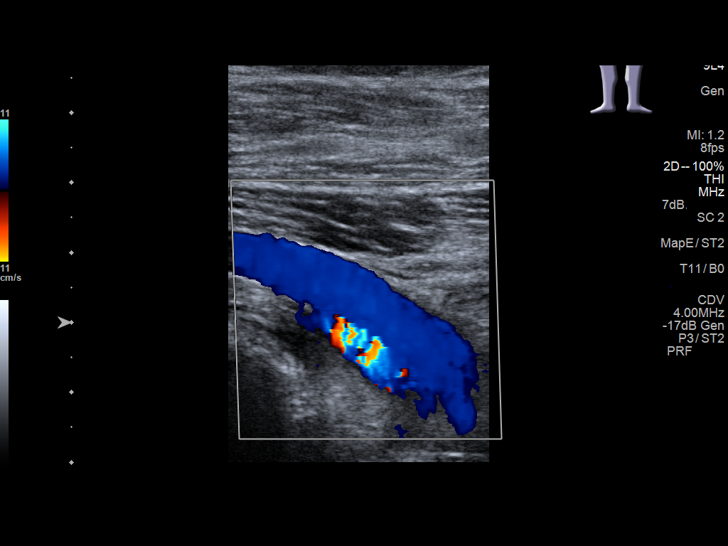
[im 72/72]
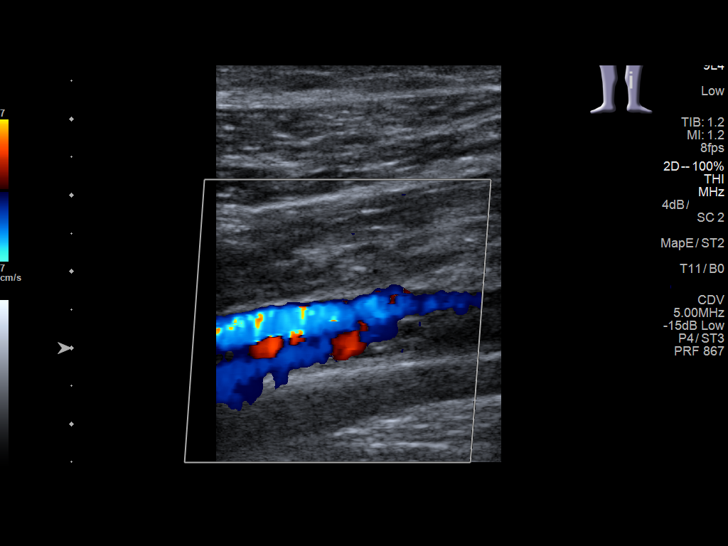

[13 of 24 positions shown; findings below may reference images not displayed]

FINDINGS: RIGHT LOWER EXTREMITY

Common Femoral Vein: No evidence of thrombus. Normal
compressibility, respiratory phasicity and response to augmentation.

Saphenofemoral Junction: No evidence of thrombus. Normal
compressibility and flow on color Doppler imaging.

Profunda Femoral Vein: No evidence of thrombus. Normal
compressibility and flow on color Doppler imaging.

Femoral Vein: No evidence of thrombus. Normal compressibility,
respiratory phasicity and response to augmentation.

Popliteal Vein: No evidence of thrombus. Normal compressibility,
respiratory phasicity and response to augmentation.

Calf Veins: No evidence of thrombus. Normal compressibility and flow
on color Doppler imaging.

Superficial Great Saphenous Vein: No evidence of thrombus. Normal
compressibility and flow on color Doppler imaging.

Venous Reflux:  None.

Other Findings:  None.

LEFT LOWER EXTREMITY

Common Femoral Vein: No evidence of thrombus. Normal
compressibility, respiratory phasicity and response to augmentation.

Saphenofemoral Junction: No evidence of thrombus. Normal
compressibility and flow on color Doppler imaging.

Profunda Femoral Vein: No evidence of thrombus. Normal
compressibility and flow on color Doppler imaging.

Femoral Vein: No evidence of thrombus. Normal compressibility,
respiratory phasicity and response to augmentation.

Popliteal Vein: No evidence of thrombus. Normal compressibility,
respiratory phasicity and response to augmentation.

Calf Veins: No evidence of thrombus. Normal compressibility and flow
on color Doppler imaging.

Superficial Great Saphenous Vein: No evidence of thrombus. Normal
compressibility and flow on color Doppler imaging.

Venous Reflux:  None.

Other Findings:  None.
IMPRESSION: No evidence of DVT within either lower extremity.

## 2018-10-02 IMAGING — US US ABDOMEN LIMITED
1 series · 14 of 25 positions shown · non-contrast
Comparison: Chest radiograph - 05/08/2016

CLINICAL DATA: Right upper quadrant pain for the past 3 days.

EXAM:
US ABDOMEN LIMITED - RIGHT UPPER QUADRANT

[Series 1: us abdomen limited · 0.26mm/px · 14 of 49 slices shown]
[im 1/49]
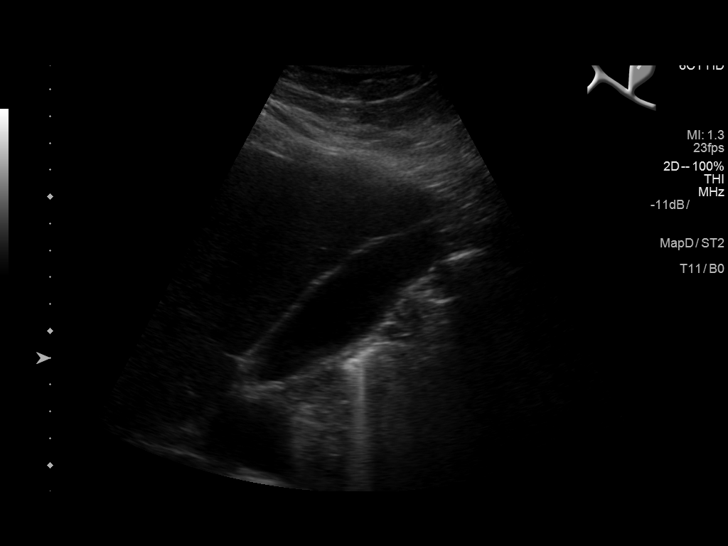
[im 5/49]
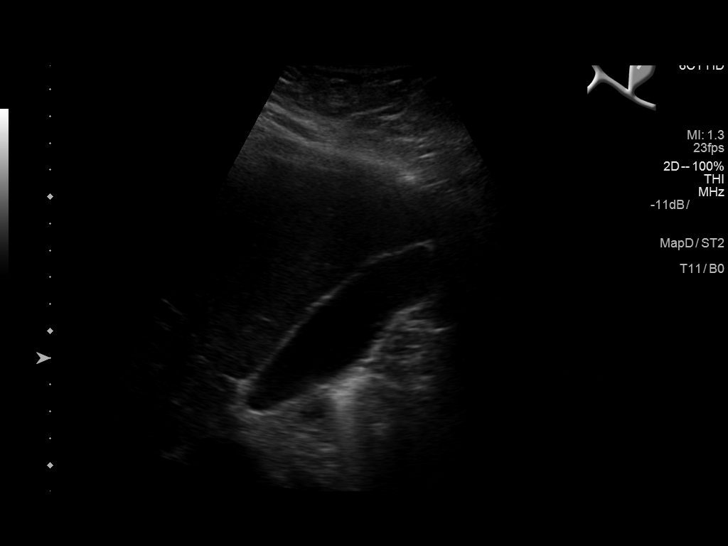
[im 9/49]
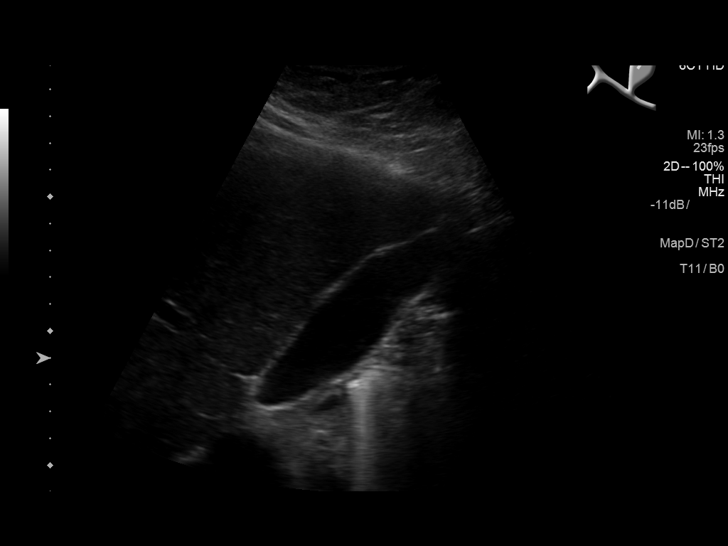
[im 13/49]
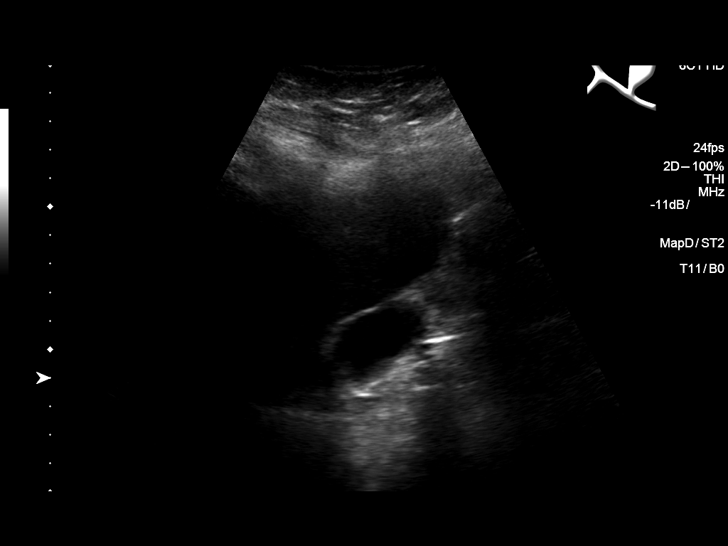
[im 17/49]
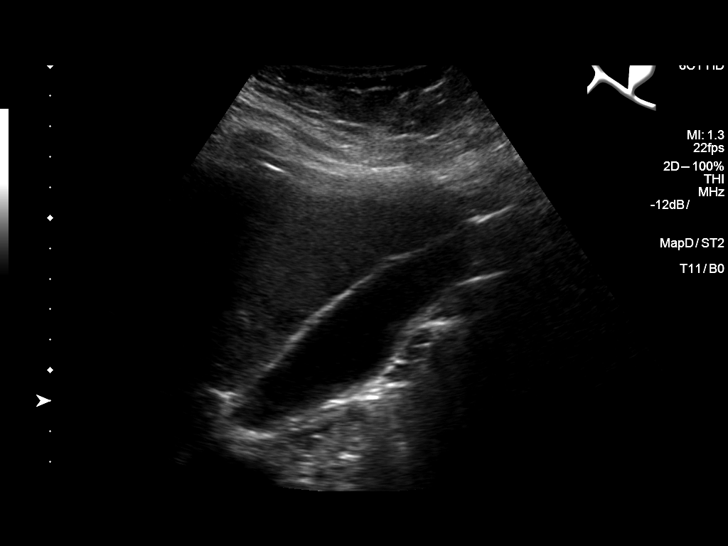
[im 19/49]
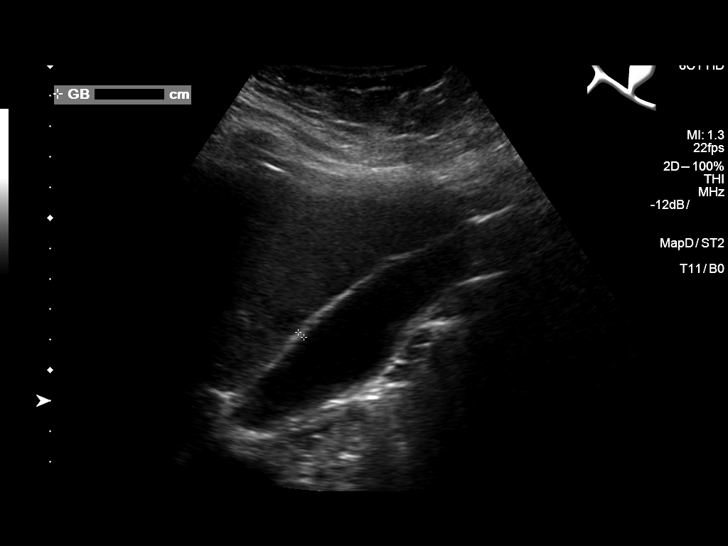
[im 23/49]
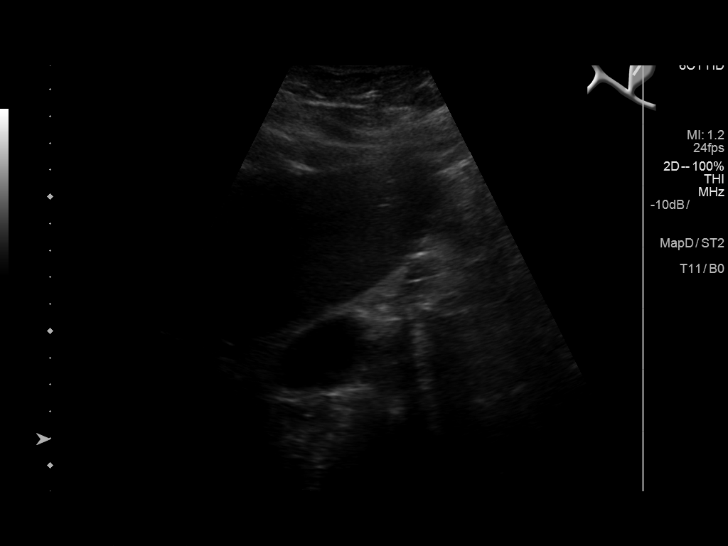
[im 27/49]
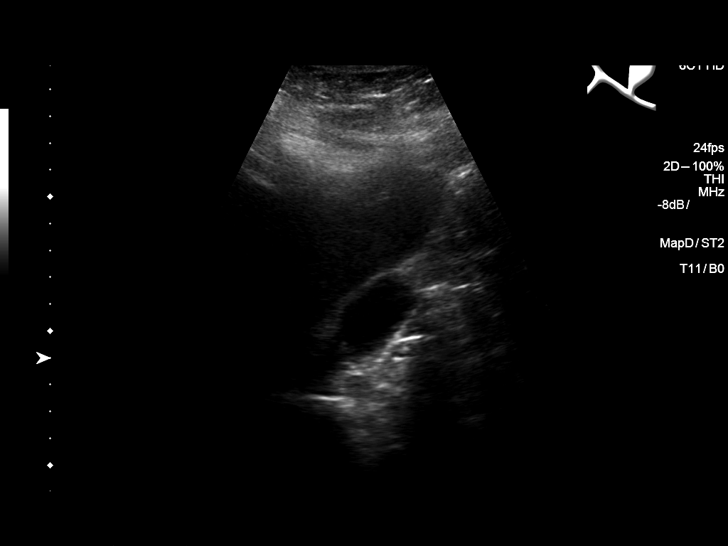
[im 31/49]
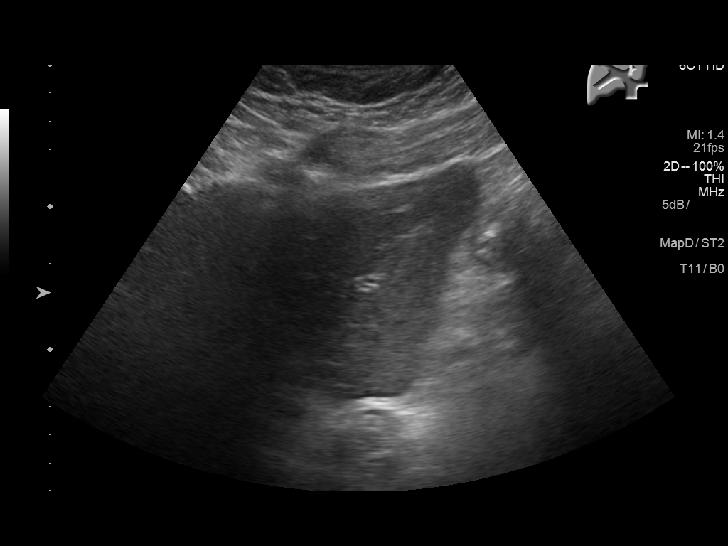
[im 33/49]
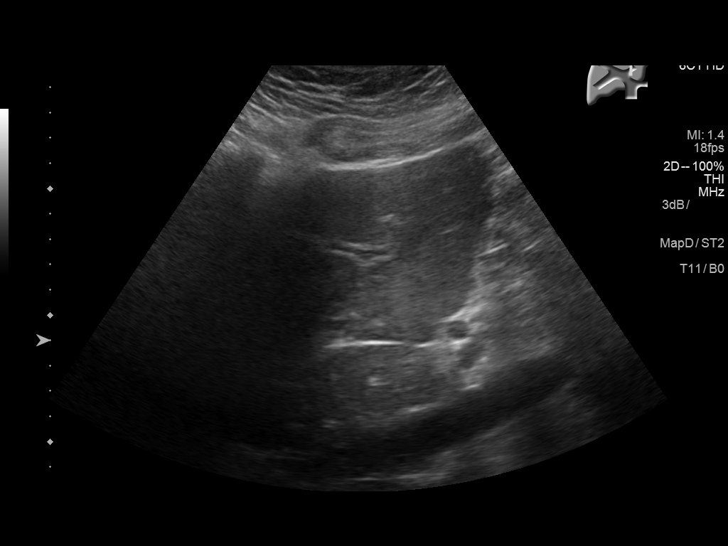
[im 37/49]
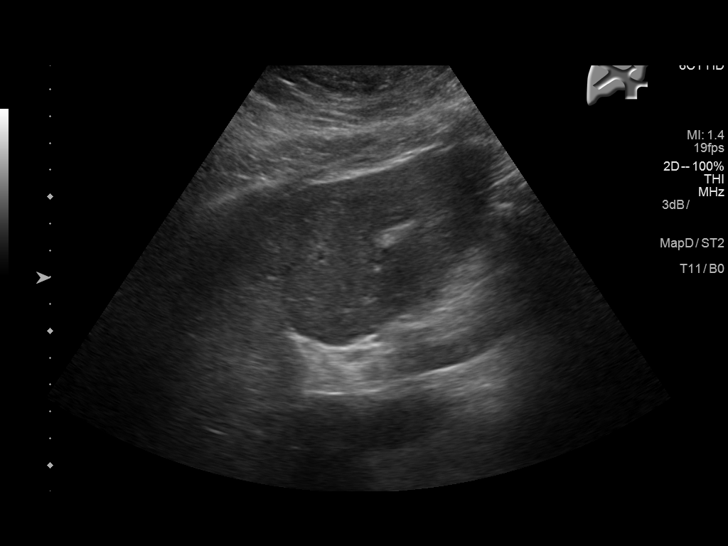
[im 41/49]
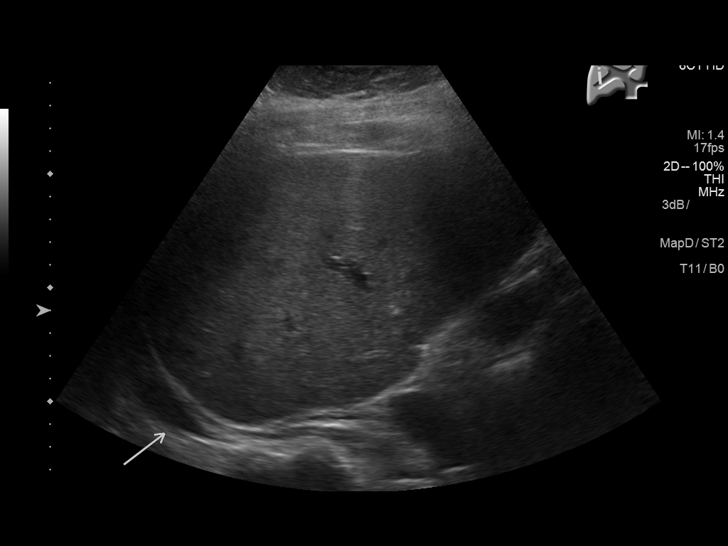
[im 45/49]
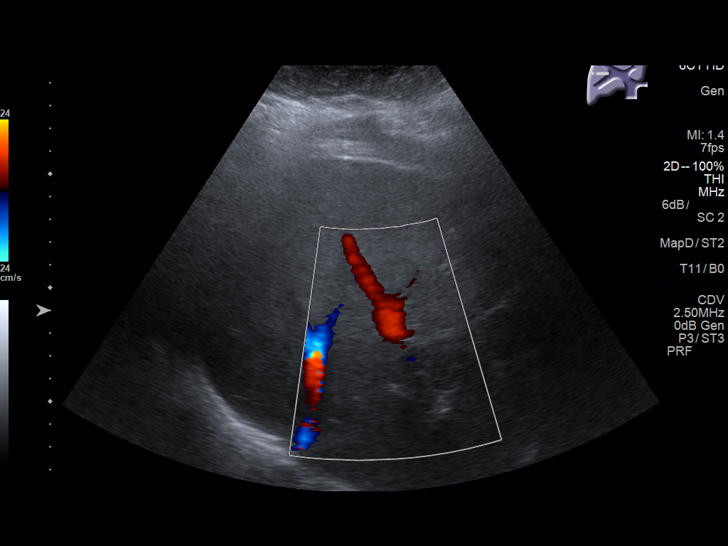
[im 49/49]
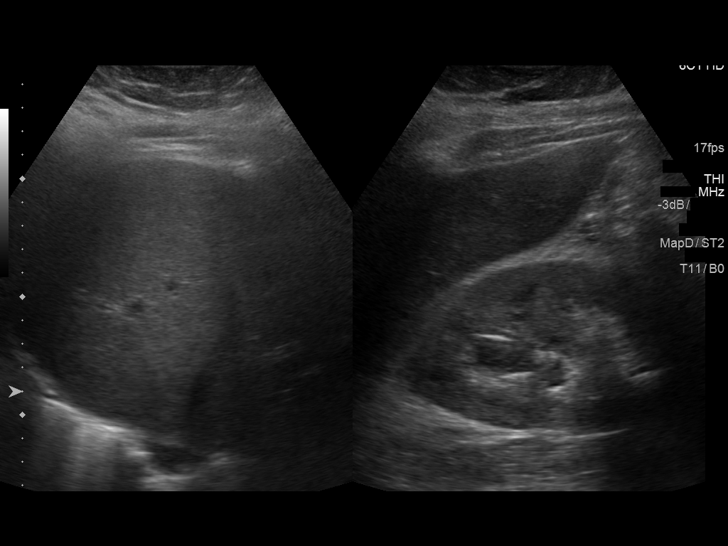

[14 of 25 positions shown; findings below may reference images not displayed]

FINDINGS: Gallbladder:

Sonographically normal. No gallbladder wall thickening or
pericholecystic fluid. No echogenic gallstones or biliary sludge.
Negative sonographic Murphy's sign.

Common bile duct:

Diameter: Normal in size measuring 3 mm in diameter

Liver:

Normal appearance of the liver. No discrete hepatic lesions. No
intrahepatic biliary duct dilatation. No ascites.

Incidentally noted trace right-sided pleural effusion (image 33) as
well as potential mild dilatation involving a pole calyx (image 34).
IMPRESSION: 1. Incidentally noted trace right-sided pleural effusion as well as
potential mild dilatation involving an incidentally imaged right
upper pole renal calyx. Clinical correlation is advised. Further
evaluation could be performed with noncontrast abdominal CT as
clinically indicated.
2. Otherwise, no explanation for patient's right upper quadrant
abdominal pain. Specifically, no evidence of
cholelithiasis/cholecystitis.

## 2018-11-01 NOTE — Progress Notes (Deleted)
11/02/2018 10:14 AM   Dave Conrad 1975-11-12 [id number]  Referring provider: [facility] [address],  [place] [address]  No chief complaint on file.   HPI: Mr. Vandenberghe is a 43 year old male with a history of nephrolithiasis and ED who presents today for follow up.  History of nephrolithiasis He has a personal history of a left staghorn calculus status post PCNL on 06/22/2016 who returned for definitive management of residual stone burden on 07/13/2016.He initially presented with a septic obstructing calculus the large staghorn stone.  Stone analysis consistent with 50% calcium phosphate, 40% magnesium ammonium phosphate, 10% ammonium urate.   Erectile dysfunction His SHIM score is ***, which is ***.   His risk factors for ED are age, pelvic radiation, BPH, DM and HTN.  He denies any painful erections or curvatures with his erections.   He is still having spontaneous erections.  He has tried tadalafil 5 mg daily in the past.       Score: 1-7 Severe ED 8-11 Moderate ED 12-16 Mild-Moderate ED 17-21 Mild ED 22-25 No ED     PMH: Past Medical History:  Diagnosis Date  . Asplenia 05/08/2016  . Diabetes mellitus without complication ([place])   . H/O sepsis 05/06/2016   due to right kidney stone  . History of kidney stones   . HIV (human immunodeficiency virus infection) ([place])   . Hypertension     Surgical History: Past Surgical History:  Procedure Laterality Date  . CYSTOSCOPY W/ RETROGRADES Bilateral 07/13/2016   Procedure: CYSTOSCOPY WITH RETROGRADE PYELOGRAM;  Surgeon: Hollice Espy, MD;  Location: [facility];  Service: Urology;  Laterality: Bilateral;  . CYSTOSCOPY WITH STENT PLACEMENT Right 05/09/2016   Procedure: CYSTOSCOPY WITH STENT PLACEMENT and retrorade pyelogram;  Surgeon: Ardis Hughs, MD;  Location: [facility];  Service: Urology;  Laterality: Right;  . CYSTOSCOPY WITH URETHRAL DILATATION N/A 06/22/2016   Procedure: CYSTOSCOPY WITH  URETHRAL DILATATION;  Surgeon: Hollice Espy, MD;  Location: [facility];  Service: Urology;  Laterality: N/A;  . CYSTOSCOPY/URETEROSCOPY/HOLMIUM LASER/STENT PLACEMENT Right 07/13/2016   Procedure: CYSTOSCOPY/URETEROSCOPY/HOLMIUM LASER/STENT EXCHANGE;  Surgeon: Hollice Espy, MD;  Location: [facility];  Service: Urology;  Laterality: Right;  . HOLMIUM LASER APPLICATION N/A 08/29/2374   Procedure: HOLMIUM LASER APPLICATION;  Surgeon: Hollice Espy, MD;  Location: [facility];  Service: Urology;  Laterality: N/A;  . IR NEPHROSTOMY PLACEMENT RIGHT  06/22/2016  . NEPHROLITHOTOMY Right 06/22/2016   Procedure: NEPHROLITHOTOMY PERCUTANEOUS;  Surgeon: Hollice Espy, MD;  Location: [facility];  Service: Urology;  Laterality: Right;  . SPLENECTOMY, TOTAL  1982  . URETEROSCOPY Right 06/22/2016   Procedure: ANTEGRADE URETEROSCOPY;  Surgeon: Hollice Espy, MD;  Location: [facility];  Service: Urology;  Laterality: Right;    Home Medications:  Allergies as of 11/02/2018      Reactions   Shellfish Allergy Anaphylaxis   Penicillins Nausea And Vomiting   Has patient had a PCN reaction causing immediate rash, facial/tongue/throat swelling, SOB or lightheadedness with hypotension: yes rash Has patient had a PCN reaction causing severe rash involving mucus membranes or skin necrosis:no Has patient had a PCN reaction that required hospitalization no Has patient had a PCN reaction occurring within the last 10 years: yes If all of the above answers are "NO", then may proceed with Cephalosporin use.      Medication List       Accurate as of November 01, 2018 10:14 AM. If you have any questions, ask your nurse  or doctor.        aspirin EC 81 MG tablet Take 81 mg by mouth at bedtime.   Biktarvy 50-200-25 MG Tabs tablet Generic drug: bictegravir-emtricitabine-tenofovir AF TAKE 1 TABLET BY MOUTH ONCE DAILY IN PLACE OF ATRIPLA   CENTRUM ULTRA MENS PO Take 1 tablet by mouth daily.   diltiazem 180 MG 24 hr capsule  Commonly known as: DILACOR XR Take 180 mg by mouth daily.   efavirenz-emtricitabine-tenofovir 600-200-300 MG tablet Commonly known as: ATRIPLA Take 1 tablet by mouth at bedtime.   glipiZIDE 10 MG 24 hr tablet Commonly known as: GLUCOTROL XL Take 5-10 mg by mouth 2 (two) times daily. 10 mg in the morning & 5 mg in the evening   lisinopril 40 MG tablet Commonly known as: ZESTRIL Take 40 mg by mouth daily.   metFORMIN 500 MG tablet Commonly known as: GLUCOPHAGE Take 1,000 mg by mouth 2 (two) times daily with a meal.   pravastatin 20 MG tablet Commonly known as: PRAVACHOL Take 20 mg by mouth every evening.   tadalafil 5 MG tablet Commonly known as: CIALIS Take 1 tablet (5 mg total) by mouth daily as needed for erectile dysfunction.       Allergies:  Allergies  Allergen Reactions  . Shellfish Allergy Anaphylaxis  . Penicillins Nausea And Vomiting    Has patient had a PCN reaction causing immediate rash, facial/tongue/throat swelling, SOB or lightheadedness with hypotension: yes rash Has patient had a PCN reaction causing severe rash involving mucus membranes or skin necrosis:no Has patient had a PCN reaction that required hospitalization no Has patient had a PCN reaction occurring within the last 10 years: yes If all of the above answers are "NO", then may proceed with Cephalosporin use.     Family History: Family History  Problem Relation Age of Onset  . Breast cancer Mother   . Kidney cancer Neg Hx   . Kidney disease Neg Hx   . Prostate cancer Neg Hx     Social History:  reports that he quit smoking about 3 years ago. His smoking use included cigarettes. He smoked 0.25 packs per day. He has never used smokeless tobacco. He reports that he does not drink alcohol or use drugs.  ROS:                                        Physical Exam: There were no vitals taken for this visit.  Constitutional:  Well nourished. Alert and oriented, No  acute distress. HEENT: [place] AT, moist mucus membranes.  Trachea midline, no masses. Cardiovascular: No clubbing, cyanosis, or edema. Respiratory: Normal respiratory effort, no increased work of breathing. GI: Abdomen is soft, non tender, non distended, no abdominal masses. Liver and spleen not palpable.  No hernias appreciated.  Stool sample for occult testing is not indicated.   GU: No CVA tenderness.  No bladder fullness or masses.  Patient with circumcised/uncircumcised phallus. ***Foreskin easily retracted***  Urethral meatus is patent.  No penile discharge. No penile lesions or rashes. Scrotum without lesions, cysts, rashes and/or edema.  Testicles are located scrotally bilaterally. No masses are appreciated in the testicles. Left and right epididymis are normal. Rectal: Patient with  normal sphincter tone. Anus and perineum without scarring or rashes. No rectal masses are appreciated. Prostate is approximately *** grams, *** nodules are appreciated. Seminal vesicles are normal. Skin: No rashes, bruises or suspicious  lesions. Lymph: No cervical or inguinal adenopathy. Neurologic: Grossly intact, no focal deficits, moving all 4 extremities. Psychiatric: Normal mood and affect.  Laboratory Data: Lab Results  Component Value Date   WBC 12.2 (H) 06/23/2016   HGB 10.9 (L) 06/23/2016   HCT 33.1 (L) 06/23/2016   MCV 78.2 (L) 06/23/2016   PLT 184 06/23/2016    Lab Results  Component Value Date   CREATININE 0.95 06/23/2016    No results found for: PSA  No results found for: TESTOSTERONE  Lab Results  Component Value Date   HGBA1C 7.5 (H) 05/08/2016    No results found for: TSH  No results found for: CHOL, HDL, CHOLHDL, VLDL, LDLCALC  Lab Results  Component Value Date   AST 45 (H) 05/08/2016   Lab Results  Component Value Date   ALT 32 05/08/2016   No components found for: ALKALINEPHOPHATASE No components found for: BILIRUBINTOTAL  No results found for: ESTRADIOL   Urinalysis    Component Value Date/Time   COLORURINE AMBER (A) 05/07/2016 1259   APPEARANCEUR Clear 07/21/2016 1505   LABSPEC 1.033 (H) 05/07/2016 1259   PHURINE 5.0 05/07/2016 1259   GLUCOSEU Negative 07/21/2016 1505   HGBUR MODERATE (A) 05/07/2016 1259   BILIRUBINUR Negative 07/21/2016 1505   KETONESUR 80 (A) 05/07/2016 1259   PROTEINUR Negative 07/21/2016 1505   PROTEINUR 100 (A) 05/07/2016 1259   NITRITE Negative 07/21/2016 1505   NITRITE NEGATIVE 05/07/2016 1259   LEUKOCYTESUR 2+ (A) 07/21/2016 1505    I have reviewed the labs.   Pertinent Imaging: *** I have independently reviewed the films.    Assessment & Plan:  ***  1. Erectile dysfunction  - SHIM score is ***, it is stable, worsening, improving  - I explained to the patient that in order to achieve an erection it takes good functioning of the nervous system (parasympathetic and rs, sympathetic, sensory and motor), good blood flow into the erectile tissue of the penis and a desire to have sex  - I explained that conditions like diabetes, hypertension, coronary artery disease, peripheral vascular disease, smoking, alcohol consumption, age, sleep apnea and BPH can diminish the ability to have an erection  - I explained the ED may be a risk marker for underlying CVD and he should follow up with PCP for further studies ***  - ED is an impairment in the arousal phase of the sexual response cycle.  Desire, orgasm and resolution are not considered ED and should be approached as a separate issue  - we will obtain a serum testosterone level at this time; if it is abnormal we will need to repeat the study for confirmation  - A recent study published in Sex Med 2018 Apr 13 revealed moderate to vigorous aerobic exercise for 40 minutes 4 times per week can decrease erectile problems caused by physical inactivity, obesity, hypertension, metabolic syndrome and/or cardiovascular diseases  - We discussed trying a *** different PDE5  inhibitor, intra-urethral suppositories, intracavernous vasoactive drug injection therapy, vacuum constriction device and penile prosthesis implantation  - He would like to try ***  - Continue Cialis, Viagra, Levitra, Stendra  - RTC in *** months for repeat SHIM score and exam   2. History of nephrolithiasis ***   No follow-ups on file.  These notes generated with voice recognition software. I apologize for typographical errors.  Michiel CowboySHANNON Darlinda Bellows, PA-C  [facility] [address]  [address] [place], [place] [address] [phone]

## 2018-11-02 ENCOUNTER — Encounter: Payer: Self-pay | Admitting: Urology

## 2018-11-02 ENCOUNTER — Ambulatory Visit: Payer: Managed Care, Other (non HMO) | Admitting: Urology

## 2018-11-10 ENCOUNTER — Other Ambulatory Visit: Payer: Self-pay | Admitting: Urology

## 2019-02-09 ENCOUNTER — Other Ambulatory Visit: Payer: Self-pay | Admitting: Urology

## 2019-04-03 ENCOUNTER — Emergency Department
Admission: EM | Admit: 2019-04-03 | Discharge: 2019-04-03 | Disposition: A | Discharge status: Home / Self Care | Payer: Managed Care, Other (non HMO) | Attending: Emergency Medicine | Admitting: Emergency Medicine

## 2019-04-03 ENCOUNTER — Encounter: Payer: Self-pay | Admitting: Emergency Medicine

## 2019-04-03 ENCOUNTER — Other Ambulatory Visit: Payer: Self-pay

## 2019-04-03 ENCOUNTER — Emergency Department: Payer: Managed Care, Other (non HMO)

## 2019-04-03 DIAGNOSIS — R509 Fever, unspecified: Secondary | ICD-10-CM

## 2019-04-03 DIAGNOSIS — Z87891 Personal history of nicotine dependence: Secondary | ICD-10-CM | POA: Insufficient documentation

## 2019-04-03 DIAGNOSIS — Z7984 Long term (current) use of oral hypoglycemic drugs: Secondary | ICD-10-CM | POA: Diagnosis not present

## 2019-04-03 DIAGNOSIS — Z20822 Contact with and (suspected) exposure to covid-19: Secondary | ICD-10-CM | POA: Insufficient documentation

## 2019-04-03 DIAGNOSIS — B20 Human immunodeficiency virus [HIV] disease: Secondary | ICD-10-CM | POA: Insufficient documentation

## 2019-04-03 DIAGNOSIS — I10 Essential (primary) hypertension: Secondary | ICD-10-CM | POA: Insufficient documentation

## 2019-04-03 DIAGNOSIS — Z7982 Long term (current) use of aspirin: Secondary | ICD-10-CM | POA: Insufficient documentation

## 2019-04-03 DIAGNOSIS — E119 Type 2 diabetes mellitus without complications: Secondary | ICD-10-CM | POA: Diagnosis not present

## 2019-04-03 DIAGNOSIS — N12 Tubulo-interstitial nephritis, not specified as acute or chronic: Secondary | ICD-10-CM | POA: Insufficient documentation

## 2019-04-03 LAB — COMPREHENSIVE METABOLIC PANEL
ALT: 27 U/L (ref 0–44)
AST: 25 U/L (ref 15–41)
Albumin: 3.9 g/dL (ref 3.5–5.0)
Alkaline Phosphatase: 42 U/L (ref 38–126)
Anion gap: 11 (ref 5–15)
BUN: 16 mg/dL (ref 6–20)
CO2: 24 mmol/L (ref 22–32)
Calcium: 8.9 mg/dL (ref 8.9–10.3)
Chloride: 98 mmol/L (ref 98–111)
Creatinine, Ser: 1.16 mg/dL (ref 0.61–1.24)
GFR calc Af Amer: >60 mL/min (ref >60)
GFR calc non Af Amer: >60 mL/min (ref >60)
Glucose, Bld: 269 mg/dL — ABNORMAL HIGH (ref 70–99)
Potassium: 3.4 mmol/L — ABNORMAL LOW (ref 3.5–5.1)
Sodium: 133 mmol/L — ABNORMAL LOW (ref 135–145)
Total Bilirubin: 0.6 mg/dL (ref 0.3–1.2)
Total Protein: 7.1 g/dL (ref 6.5–8.1)

## 2019-04-03 LAB — URINALYSIS, COMPLETE (UACMP) WITH MICROSCOPIC
Bacteria, UA: NONE SEEN
Bilirubin Urine: NEGATIVE
Glucose, UA: >=500 mg/dL — AB
Hgb urine dipstick: NEGATIVE
Ketones, ur: 20 mg/dL — AB
Leukocytes,Ua: NEGATIVE
Nitrite: NEGATIVE
Protein, ur: NEGATIVE mg/dL
Specific Gravity, Urine: 1.027 (ref 1.005–1.030)
pH: 6 (ref 5.0–8.0)

## 2019-04-03 LAB — CBC WITH DIFFERENTIAL/PLATELET
Abs Immature Granulocytes: 0.03 10*3/uL (ref 0.00–0.07)
Basophils Absolute: 0 10*3/uL (ref 0.0–0.1)
Basophils Relative: 0 %
Eosinophils Absolute: 0 10*3/uL (ref 0.0–0.5)
Eosinophils Relative: 0 %
HCT: 44.1 % (ref 39.0–52.0)
Hemoglobin: 14.5 g/dL (ref 13.0–17.0)
Immature Granulocytes: 0 %
Lymphocytes Relative: 7 %
Lymphs Abs: 0.5 10*3/uL — ABNORMAL LOW (ref 0.7–4.0)
MCH: 25.8 pg — ABNORMAL LOW (ref 26.0–34.0)
MCHC: 32.9 g/dL (ref 30.0–36.0)
MCV: 78.3 fL — ABNORMAL LOW (ref 80.0–100.0)
Monocytes Absolute: 0.5 10*3/uL (ref 0.1–1.0)
Monocytes Relative: 6 %
Neutro Abs: 6.9 10*3/uL (ref 1.7–7.7)
Neutrophils Relative %: 87 %
Platelets: 154 10*3/uL (ref 150–400)
RBC: 5.63 MIL/uL (ref 4.22–5.81)
RDW: 13.9 % (ref 11.5–15.5)
WBC: 8 10*3/uL (ref 4.0–10.5)
nRBC: 0 % (ref 0.0–0.2)

## 2019-04-03 LAB — LACTIC ACID, PLASMA: Lactic Acid, Venous: 1.1 mmol/L (ref 0.5–1.9)

## 2019-04-03 MED ORDER — CEFTRIAXONE SODIUM 1 G IJ SOLR
1.0000 g | Freq: Once | INTRAMUSCULAR | Status: AC
  Administered 2019-04-03: 14:00:00 1 g via INTRAMUSCULAR
  Filled 2019-04-03: qty 10

## 2019-04-03 MED ORDER — SODIUM CHLORIDE 0.9 % IV SOLN
1.0000 g | Freq: Once | INTRAVENOUS | Status: DC
  Filled 2019-04-03: qty 10

## 2019-04-03 MED ORDER — ACETAMINOPHEN 325 MG PO TABS
650.0000 mg | ORAL_TABLET | Freq: Once | ORAL | Status: AC | PRN
  Administered 2019-04-03: 650 mg via ORAL
  Filled 2019-04-03: qty 2

## 2019-04-03 MED ORDER — LIDOCAINE HCL (PF) 1 % IJ SOLN
5.0000 mL | Freq: Once | INTRAMUSCULAR | Status: AC
  Administered 2019-04-03: 5 mL
  Filled 2019-04-03: qty 5

## 2019-04-03 MED ORDER — SULFAMETHOXAZOLE-TRIMETHOPRIM 800-160 MG PO TABS: 1.0000 | ORAL_TABLET | Freq: Two times a day (BID) | ORAL | 0 refills | Status: DC

## 2019-04-03 NOTE — ED Provider Notes (Signed)
[facility] Emergency Department Provider Note  ____________________________________________   First MD Initiated Contact with Patient 04/03/19 [phone]     (approximate)  I have reviewed the triage vital signs and the nursing notes.   HISTORY  Chief Complaint Fever   HPI Dave Conrad is a 44 y.o. male presents to the ED with complaint of fever last evening.  Patient states he took some Tylenol and states that his fever returned this morning.  Patient complains of dysuria and some redness at the meatus of his penis.  He also reports a "weaker stream".  Patient has a history of kidney stones with the last one being in 2018 at which time he had to have surgery to remove it as it was too large to pass.  He denies any concern for STD.  He denies any nausea, vomiting, flank pain, diarrhea.  He rates his pain as a 4 out of 10.      Past Medical History:  Diagnosis Date  . Asplenia 05/08/2016  . Diabetes mellitus without complication ([place])   . H/O sepsis 05/06/2016   due to right kidney stone  . History of kidney stones   . HIV (human immunodeficiency virus infection) ([place])   . Hypertension     Patient Active Problem List   Diagnosis Date Noted  . Type 2 diabetes mellitus without complication, without long-term current use of insulin ([place]) 06/26/2016  . Essential hypertension 06/26/2016  . Right kidney stone 06/22/2016  . Ureteral obstruction, right 05/27/2016  . Malnutrition of moderate degree 05/09/2016  . Asplenia 05/08/2016  . Sepsis ([place]) 05/07/2016  . 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 Positive serology for syphilis 10/30/2015  . Morbid obesity with BMI of 40.0-44.9, adult ([place]) 10/30/2015  . HIV positive ([place]) 10/30/2015  . H/O splenectomy 10/30/2015  . Human immunodeficiency virus (HIV) disease ([place]) 12/31/2014  . History of syphilis 12/31/2014  . 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  CYSTOSCOPY W/ RETROGRADES Bilateral 07/13/2016   Procedure: CYSTOSCOPY WITH RETROGRADE PYELOGRAM;  Surgeon: Hollice Espy, MD;  Location: [facility];  Service: Urology;  Laterality: Bilateral;  . CYSTOSCOPY WITH STENT PLACEMENT Right 05/09/2016   Procedure: CYSTOSCOPY WITH STENT PLACEMENT and retrorade pyelogram;  Surgeon: Ardis Hughs, MD;  Location: [facility];  Service: Urology;  Laterality: Right;  . CYSTOSCOPY WITH URETHRAL DILATATION N/A 06/22/2016   Procedure: CYSTOSCOPY WITH URETHRAL DILATATION;  Surgeon: Hollice Espy, MD;  Location: [facility];  Service: Urology;  Laterality: N/A;  . CYSTOSCOPY/URETEROSCOPY/HOLMIUM LASER/STENT PLACEMENT Right 07/13/2016   Procedure: CYSTOSCOPY/URETEROSCOPY/HOLMIUM LASER/STENT EXCHANGE;  Surgeon: Hollice Espy, MD;  Location: [facility];  Service: Urology;  Laterality: Right;  . HOLMIUM LASER APPLICATION N/A 03/19/5807   Procedure: HOLMIUM LASER APPLICATION;  Surgeon: Hollice Espy, MD;  Location: [facility];  Service: Urology;  Laterality: N/A;  . IR NEPHROSTOMY PLACEMENT RIGHT  06/22/2016  . NEPHROLITHOTOMY Right 06/22/2016   Procedure: NEPHROLITHOTOMY PERCUTANEOUS;  Surgeon: Hollice Espy, MD;  Location: [facility];  Service: Urology;  Laterality: Right;  . SPLENECTOMY, TOTAL  1982  . URETEROSCOPY Right 06/22/2016   Procedure: ANTEGRADE URETEROSCOPY;  Surgeon: Hollice Espy, MD;  Location: [facility];  Service: Urology;  Laterality: Right;    Prior to Admission medications   Medication Sig Start Date End Date Taking? Authorizing Provider  aspirin EC 81 MG tablet Take 81 mg by mouth at bedtime.     [provider]  bictegravir-emtricitabine-tenofovir AF ([place]) [phone]  MG TABS tablet TAKE 1 TABLET BY MOUTH ONCE DAILY IN PLACE OF ATRIPLA 04/18/18   [provider]  diltiazem (DILACOR XR) 180 MG 24 hr capsule Take 180 mg by mouth daily.    [provider]  efavirenz-emtricitabine-tenofovir (ATRIPLA) 600-200-300 MG tablet Take 1  tablet by mouth at bedtime.    [provider]  glipiZIDE (GLUCOTROL XL) 10 MG 24 hr tablet Take 5-10 mg by mouth 2 (two) times daily. 10 mg in the morning & 5 mg in the evening    [provider]  lisinopril (PRINIVIL,ZESTRIL) 40 MG tablet Take 40 mg by mouth daily. 08/12/17   [provider]  metFORMIN (GLUCOPHAGE) 500 MG tablet Take 1,000 mg by mouth 2 (two) times daily with a meal.    [provider]  Multiple Vitamins-Minerals (CENTRUM ULTRA MENS PO) Take 1 tablet by mouth daily.    [provider]  pravastatin (PRAVACHOL) 20 MG tablet Take 20 mg by mouth every evening.     [provider]  sulfamethoxazole-trimethoprim (BACTRIM DS) 800-160 MG tablet Take 1 tablet by mouth 2 (two) times daily. 04/03/19   Tommi Rumps, PA-C  tadalafil (CIALIS) 5 MG tablet TAKE ONE TABLET BY MOUTH DAILY AS NEEDED FOR ERECTILE DYSFUNCTION 02/09/19   Michiel Cowboy A, PA-C    Allergies Shellfish allergy and Penicillins  Family History  Problem Relation Age of Onset  . Breast cancer Mother   . Kidney cancer Neg Hx   . Kidney disease Neg Hx   . Prostate cancer Neg Hx     Social History Social History   Tobacco Use  . Smoking status: Former Smoker    Types: Cigarettes    Quit date: 07/04/2015    Years since quitting: 3.7  . Smokeless tobacco: Never Used  Substance Use Topics  . Alcohol use: No  . Drug use: No    Review of Systems Constitutional: Positive fever/chills Cardiovascular: Denies chest pain. Respiratory: Denies shortness of breath.  Negative for cough. Gastrointestinal: No abdominal pain.  No nausea, no vomiting. Genitourinary: Positive for dysuria. Musculoskeletal: Negative for muscle aches. Skin: Negative for rash. Neurological: Negative for headaches, focal weakness or numbness. ____________________________________________   PHYSICAL EXAM:  VITAL SIGNS: ED Triage Vitals  Enc Vitals Group     BP 04/03/19 0918 (!)  156/85     Pulse Rate 04/03/19 0918 (!) 129     Resp 04/03/19 0918 16     Temp 04/03/19 0918 (!) 102.4 F (39.1 C)     Temp Source 04/03/19 0918 Oral     SpO2 04/03/19 0918 96 %     Weight 04/03/19 0916 280 lb (127 kg)     Height 04/03/19 0916 5\' 6"  (1.676 m)     Head Circumference --      Peak Flow --      Pain Score 04/03/19 0916 4     Pain Loc --      Pain Edu? --      Excl. in GC? --     Constitutional: Alert and oriented. Well appearing and in no acute distress. Eyes: Conjunctivae are normal.  Head: Atraumatic. Neck: No stridor.   Cardiovascular: Normal rate, regular rhythm. Grossly normal heart sounds.  Good peripheral circulation. Respiratory: Normal respiratory effort.  No retractions. Lungs CTAB. Gastrointestinal: Soft and nontender. No distention.  No CVA tenderness. Musculoskeletal: Moves upper and lower extremities no difficulty.  Normal gait was noted. Neurologic:  Normal speech and language. No gross focal  neurologic deficits are appreciated. No gait instability. Skin:  Skin is warm, dry and intact. No rash noted. Psychiatric: Mood and affect are normal. Speech and behavior are normal.  ____________________________________________   LABS (all labs ordered are listed, but only abnormal results are displayed)  Labs Reviewed  URINALYSIS, COMPLETE (UACMP) WITH MICROSCOPIC - Abnormal; Notable for the following components:      Result Value   Color, Urine YELLOW (*)    APPearance CLEAR (*)    Glucose, UA >=500 (*)    Ketones, ur 20 (*)    All other components within normal limits  CBC WITH DIFFERENTIAL/PLATELET - Abnormal; Notable for the following components:   MCV 78.3 (*)    MCH 25.8 (*)    Lymphs Abs 0.5 (*)    All other components within normal limits  COMPREHENSIVE METABOLIC PANEL - Abnormal; Notable for the following components:   Sodium 133 (*)    Potassium 3.4 (*)    Glucose, Bld 269 (*)    All other components within normal limits  SARS  CORONAVIRUS 2 (TAT 6-24 HRS)  LACTIC ACID, PLASMA    RADIOLOGY   Official radiology report(s): CT Renal Stone Study  Result Date: 04/03/2019 CLINICAL DATA:  Fever, chills, hematuria, history of kidney stones EXAM: CT ABDOMEN AND PELVIS WITHOUT CONTRAST TECHNIQUE: Multidetector CT imaging of the abdomen and pelvis was performed following the standard protocol without IV contrast. Sagittal and coronal MPR images reconstructed from axial data set. Oral contrast not administered for this indication COMPARISON:  None FINDINGS: Lower chest: Lung bases clear Hepatobiliary: Gallbladder and liver normal appearance Pancreas: Normal appearance Spleen: Chart indicates a history of splenectomy but a normal sized spleen versus splenule is identified in the LEFT upper quadrant Adrenals/Urinary Tract: Adrenal glands normal appearance. Question minimal infiltration of peripelvic/renal sinus fat at RIGHT kidney. Kidneys, ureters, and bladder otherwise normal appearance. No urinary tract calcification or dilatation. Stomach/Bowel: Normal appendix. Stomach and bowel loops normal appearance. Vascular/Lymphatic: Atherosclerotic calcifications iliac arteries. Few pelvic phleboliths. Aorta normal caliber. No adenopathy. Reproductive: Unremarkable prostate gland and seminal vesicles Other: Umbilical hernia containing fat. No free air or free fluid. Musculoskeletal: Unremarkable IMPRESSION: Question minimal infiltration of peripelvic/renal sinus fat at RIGHT kidney, cannot exclude pyelonephritis; correlation with urinalysis recommended. No urinary tract calcification or dilatation. Normal spleen versus large splenule LEFT upper quadrant in patient with reported history of splenectomy. Umbilical hernia containing fat. Electronically Signed   By: Ulyses Southward M.D.   On: 04/03/2019 11:48    ____________________________________________   PROCEDURES  Procedure(s) performed (including Critical  Care):  Procedures   ____________________________________________   INITIAL IMPRESSION / ASSESSMENT AND PLAN / ED COURSE  As part of my medical decision making, I reviewed the following data within the electronic MEDICAL RECORD NUMBER Notes from prior ED visits and [place] Controlled Substance Database  44 year old male presents to the ED with complaint of fever that began last evening.  He also complains of dysuria and is noticed a weaker stream when he urinates.  Patient denies any previous urinary tract infections but in 2018 did have a kidney stone large enough that he was unable to pass it and needed surgery.  Patient denies any nausea, vomiting, flank pain.  He also denies any changes in smell, taste, diarrhea or exposure to Covid to his knowledge.  Urinalysis showed 6-10 WBCs which was suspicious.  Remaining lab work was unremarkable with the exception of his glucose which was elevated to 69 however patient has a history of  diabetes.  CT scan was suspicious for a right pyelonephritis.  Patient had no CVA tenderness.  It was discussed with Dr. Derrill Kay and patient agreed with plan.  Patient was given Rocephin 1 g while in the ED and will continue taking Bactrim DS twice daily for 10 days.  He is to increase fluids.  Take Tylenol or ibuprofen as needed for fever.  He is aware that if he is becomes worse, unable to take the antibiotic, vomiting or flank pain he is to return to the emergency department.  ____________________________________________   FINAL CLINICAL IMPRESSION(S) / ED DIAGNOSES  Final diagnoses:  Pyelonephritis of right kidney  Fever in adult     ED Discharge Orders         Ordered    sulfamethoxazole-trimethoprim (BACTRIM DS) 800-160 MG tablet  2 times daily     04/03/19 1358           Note:  This document was prepared using Dragon voice recognition software and may include unintentional dictation errors.    Tommi Rumps, PA-C 04/03/19 1701    Phineas Semen,  MD 04/05/19 [phone]

## 2019-04-03 NOTE — Discharge Instructions (Addendum)
Follow-up with your primary care provider if any continued problems or concerns.  Return to the emergency department immediately if any severe worsening of your symptoms such as fever, chills, nausea or vomiting.  Also if you are unable to take the antibiotic return to the emergency department.  You may take Tylenol or ibuprofen as needed for fever.  You will need to increase fluids.  Also a Covid test was done today.  The results should be seen tomorrow.  You can look on my chart and it possibly may be available for you to look at tonight.

## 2019-04-03 NOTE — ED Triage Notes (Signed)
Pt in via POV, reports fever, chills, penile pain since last night.  Denies any concern for STD.  Ambulatory to triage, NAD noted at this time.

## 2019-04-03 NOTE — ED Notes (Addendum)
See triage note  Presents with fever which started last pm  States he took some tylenol last pm  But fever returned this am  Also states he noticed some burning and redness to tip of penis and a weaker stream

## 2019-04-03 NOTE — ED Notes (Signed)
Pt verbalized understanding of discharge instructions. NAD at this time. 

## 2019-04-03 NOTE — ED Notes (Signed)
Unable to obtain an IV  Provider aware

## 2019-04-04 LAB — SARS CORONAVIRUS 2 (TAT 6-24 HRS): SARS Coronavirus 2: NEGATIVE

## 2019-05-13 ENCOUNTER — Other Ambulatory Visit: Payer: Self-pay | Admitting: Urology

## 2019-07-11 ENCOUNTER — Ambulatory Visit: Payer: Self-pay | Admitting: Physician Assistant

## 2019-07-11 ENCOUNTER — Other Ambulatory Visit: Payer: Self-pay

## 2019-07-11 ENCOUNTER — Encounter: Payer: Self-pay | Admitting: Physician Assistant

## 2019-07-11 DIAGNOSIS — Z202 Contact with and (suspected) exposure to infections with a predominantly sexual mode of transmission: Secondary | ICD-10-CM

## 2019-07-11 DIAGNOSIS — Z113 Encounter for screening for infections with a predominantly sexual mode of transmission: Secondary | ICD-10-CM

## 2019-07-11 MED ORDER — AZITHROMYCIN 500 MG PO TABS
2000.0000 mg | ORAL_TABLET | Freq: Once | ORAL | Status: AC
  Administered 2019-07-11: 2000 mg via ORAL

## 2019-07-11 MED ORDER — GENTAMICIN SULFATE 40 MG/ML IJ SOLN
240.0000 mg | Freq: Once | INTRAMUSCULAR | Status: AC
  Administered 2019-07-11: 240 mg via INTRAMUSCULAR

## 2019-07-11 NOTE — Progress Notes (Signed)
[facility] Department STI clinic/screening visit  Subjective:  Rawn Sedano is a 44 y.o. male being seen today for an STI screening visit. The patient reports they do not have symptoms.    Patient has the following medical conditions:   Patient Active Problem List   Diagnosis Date Noted  . Type 2 diabetes mellitus without complication, without long-term current use of insulin (HCC) 06/26/2016  . Essential hypertension 06/26/2016  . Right kidney stone 06/22/2016  . Ureteral obstruction, right 05/27/2016  . Malnutrition of moderate degree 05/09/2016  . Asplenia 05/08/2016  . Sepsis (HCC) 05/07/2016  . 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 Positive serology for syphilis 10/30/2015  . Morbid obesity with BMI of 40.0-44.9, adult (HCC) 10/30/2015  . HIV positive (HCC) 10/30/2015  . H/O splenectomy 10/30/2015  . Human immunodeficiency virus (HIV) disease (HCC) 12/31/2014  . History of syphilis 12/31/2014  . 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 SEXUALLY TRANSMITTED DISEASE    HPI  Patient reports that he is not having any symptoms but is a contact to [facility] and would like screening and treatment today.  Reports that he is seeing his PCP/ID providers regularly and taking his medicines as directed.   See flowsheet for further details and programmatic requirements.    The following portions of the patient's history were reviewed and updated as appropriate: allergies, current medications, past medical history, past social history, past surgical history and problem list.  Objective:  There were no vitals filed for this visit.  Physical Exam Constitutional:      General: He is not in acute distress.    Appearance: Normal appearance. He is obese.  HENT:     Head: Normocephalic and atraumatic.     Comments: No nits, lice, or hair loss. No cervical, supraclavicular or axillary adenopathy.    Mouth/Throat:     Mouth: Mucous  membranes are moist.     Pharynx: Oropharynx is clear. No oropharyngeal exudate or posterior oropharyngeal erythema.  Eyes:     Conjunctiva/sclera: Conjunctivae normal.  Pulmonary:     Effort: Pulmonary effort is normal.  Abdominal:     Palpations: Abdomen is soft. There is no mass.     Tenderness: There is no abdominal tenderness. There is no guarding or rebound.  Genitourinary:    Penis: Normal.      Testes: Normal.     Rectum: Normal.     Comments: Pubic area without nits, lice, edema, erythema, lesions and inguinal adenopathy. Penis uncircumcised, without rash, lesions and discharge from meatus. Musculoskeletal:     Cervical back: Neck supple. No tenderness.  Skin:    General: Skin is warm and dry.     Findings: No bruising, erythema, lesion or rash.  Neurological:     Mental Status: He is alert and oriented to person, place, and time.  Psychiatric:        Mood and Affect: Mood normal.        Behavior: Behavior normal.        Thought Content: Thought content normal.        Judgment: Judgment normal.       Assessment and Plan:  Quintan Pfannenstiel is a 44 y.o. male presenting to the [facility] Department for STI screening  1. Screening for STD (sexually transmitted disease) Patient into clinic without symptoms.  Declines blood work today since checked/monitored with ID/PCP regularly. Rec condoms with all sex. Await test results.  Counseled that RN will call if has a positive test result. - [facility]  2. Gonorrhea contact Treat as a contact to [facility] with Gentamicin 240mg  IM and Azithromycin 2 g po DOT. No sex for 7 days and until after partner completes treatment. RTC for re-treatment if vomits < 2 hr after taking medicine.  - gentamicin (GARAMYCIN) injection 240 mg - azithromycin (ZITHROMAX) tablet 2,000 mg     No follow-ups on file.  No future  appointments.  Jerene Dilling, PA

## 2019-07-11 NOTE — Progress Notes (Signed)
Patient treated as a contact to Gonorrhea per provider orders. Tolerated well. Tawny Hopping, RN

## 2019-07-11 NOTE — Progress Notes (Addendum)
Here today for STD screening. Accepts bloodwork. Dung Salinger, RN ° °

## 2019-07-17 ENCOUNTER — Telehealth: Payer: Self-pay | Admitting: Family Medicine

## 2019-07-17 ENCOUNTER — Encounter: Payer: Self-pay | Admitting: Physician Assistant

## 2019-07-17 NOTE — Telephone Encounter (Signed)
Mychart message sent to patient re: results. Solana Coggin, RN  

## 2019-07-17 NOTE — Telephone Encounter (Signed)
Patient would like STI results. °

## 2019-08-07 ENCOUNTER — Other Ambulatory Visit: Payer: Self-pay | Admitting: Urology

## 2019-08-15 ENCOUNTER — Other Ambulatory Visit: Payer: Self-pay | Admitting: Urology

## 2019-08-23 ENCOUNTER — Encounter: Payer: Self-pay | Admitting: Physician Assistant

## 2019-08-23 ENCOUNTER — Other Ambulatory Visit: Payer: Self-pay | Admitting: Urology

## 2019-08-24 NOTE — Progress Notes (Signed)
08/25/2019 8:38 AM   Dave Conrad 02/25/1975 [id number]  Referring provider: [place], [facility] [address] [place],  [place] [id number]  Chief Complaint  Patient presents with   Erectile Dysfunction    HPI: Dave Conrad is a 44 y.o. diabetic male with ED who presents today for a follow up.  Erectile dysfunction SHIM score: 12 - without medications Main complaint: Maintaining erections  Risk factors:  age, BPH, DM, HTN and HLD  No painful erections or curvatures with his erections.    Still having spontaneous erections.  Tried:  Cialis 5 mg daily with great results     SHIM    Row Name 08/25/19 0912         SHIM: Over the last 6 months:   How do you rate your confidence that you could get and keep an erection? Low     When you had erections with sexual stimulation, how often were your erections hard enough for penetration (entering your partner)? A Few Times (much less than half the time)     During sexual intercourse, how often were you able to maintain your erection after you had penetrated (entered) your partner? A Few Times (much less than half the time)     During sexual intercourse, how difficult was it to maintain your erection to completion of intercourse? Difficult     When you attempted sexual intercourse, how often was it satisfactory for you? Sometimes (about half the time)       SHIM Total Score   SHIM 12            Score: 1-7 Severe ED 8-11 Moderate ED 12-16 Mild-Moderate ED 17-21 Mild ED 22-25 No ED  Patient was seen in the ED on 04/03/2019 and diagnosed with pyelonephritis.  CT renal stone study noted Adrenal glands normal appearance. Question minimal infiltration of peripelvic/renal sinus fat at RIGHT kidney.  Kidneys, ureters, and bladder otherwise normal appearance. No urinary tract calcification or dilatation.  Unremarkable prostate gland and seminal vesicles.  His UA was not impressive at that time and WBC count was normal at 8.0.   He stated he was having fevers, dysuria and redness at his meatus.  He felt better after taking an antibiotic.    PMH: Past Medical History:  Diagnosis Date   Asplenia 05/08/2016   Diabetes mellitus without complication (HCC)    H/O sepsis 05/06/2016   due to right kidney stone   History of kidney stones    HIV (human immunodeficiency virus infection) (HCC)    Hypertension     Surgical History: Past Surgical History:  Procedure Laterality Date   CYSTOSCOPY W/ RETROGRADES Bilateral 07/13/2016   Procedure: CYSTOSCOPY WITH RETROGRADE PYELOGRAM;  Surgeon: Vanna ScotlandBrandon, Ashley, MD;  Location: [facility];  Service: Urology;  Laterality: Bilateral;   CYSTOSCOPY WITH STENT PLACEMENT Right 05/09/2016   Procedure: CYSTOSCOPY WITH STENT PLACEMENT and retrorade pyelogram;  Surgeon: Crist FatBenjamin W Herrick, MD;  Location: [facility];  Service: Urology;  Laterality: Right;   CYSTOSCOPY WITH URETHRAL DILATATION N/A 06/22/2016   Procedure: CYSTOSCOPY WITH URETHRAL DILATATION;  Surgeon: Vanna ScotlandAshley Brandon, MD;  Location: [facility];  Service: Urology;  Laterality: N/A;   CYSTOSCOPY/URETEROSCOPY/HOLMIUM LASER/STENT PLACEMENT Right 07/13/2016   Procedure: CYSTOSCOPY/URETEROSCOPY/HOLMIUM LASER/STENT EXCHANGE;  Surgeon: Vanna ScotlandBrandon, Ashley, MD;  Location: [facility];  Service: Urology;  Laterality: Right;   HOLMIUM LASER APPLICATION N/A 06/22/2016   Procedure: HOLMIUM LASER APPLICATION;  Surgeon: Vanna ScotlandAshley Brandon, MD;  Location: [facility];  Service: Urology;  Laterality: N/A;   IR NEPHROSTOMY PLACEMENT RIGHT  06/22/2016   NEPHROLITHOTOMY Right 06/22/2016   Procedure: NEPHROLITHOTOMY PERCUTANEOUS;  Surgeon: Vanna Scotland, MD;  Location: [facility];  Service: Urology;  Laterality: Right;   SPLENECTOMY, TOTAL  1982   URETEROSCOPY Right 06/22/2016   Procedure: ANTEGRADE URETEROSCOPY;  Surgeon: Vanna Scotland, MD;  Location: [facility];  Service: Urology;  Laterality: Right;    Home Medications:  Allergies as of 08/25/2019       Reactions   Dulaglutide Rash   Shellfish Allergy Anaphylaxis   Penicillins Nausea And Vomiting   Has patient had a PCN reaction causing immediate rash, facial/tongue/throat swelling, SOB or lightheadedness with hypotension: yes rash Has patient had a PCN reaction causing severe rash involving mucus membranes or skin necrosis:no Has patient had a PCN reaction that required hospitalization no Has patient had a PCN reaction occurring within the last 10 years: yes If all of the above answers are "NO", then may proceed with Cephalosporin use.      Medication List       Accurate as of August 25, 2019 11:59 PM. If you have any questions, ask your nurse or doctor.        STOP taking these medications   sulfamethoxazole-trimethoprim 800-160 MG tablet Commonly known as: BACTRIM DS Stopped by: Michiel Cowboy, PA-C     TAKE these medications   aspirin EC 81 MG tablet Take 81 mg by mouth at bedtime.   Basaglar KwikPen 100 UNIT/ML Inject 20 Units into the skin at bedtime.   Biktarvy 50-200-25 MG Tabs tablet Generic drug: bictegravir-emtricitabine-tenofovir AF TAKE 1 TABLET BY MOUTH ONCE DAILY IN PLACE OF ATRIPLA   CENTRUM ULTRA MENS PO Take 1 tablet by mouth daily.   diltiazem 180 MG 24 hr capsule Commonly known as: DILACOR XR Take 180 mg by mouth daily.   diltiazem 240 MG 24 hr capsule Commonly known as: CARDIZEM CD Take 240 mg by mouth daily.   diphenhydrAMINE 50 MG tablet Commonly known as: BENADRYL Take one hour prior to the CT scan Started by: Izaya Netherton, PA-C   efavirenz-emtricitabine-tenofovir 600-200-300 MG tablet Commonly known as: ATRIPLA Take 1 tablet by mouth at bedtime.   EPINEPHrine 0.3 mg/0.3 mL Soaj injection Commonly known as: EPI-PEN SMARTSIG:1 Pre-Filled Pen Syringe IM PRN   glipiZIDE 10 MG 24 hr tablet Commonly known as: GLUCOTROL XL Take 5-10 mg by mouth 2 (two) times daily. 10 mg in the morning & 5 mg in the evening   hydrochlorothiazide 25 MG  tablet Commonly known as: HYDRODIURIL Take 1 tablet by mouth daily.   latanoprost 0.005 % ophthalmic solution Commonly known as: XALATAN   Levemir FlexTouch 100 UNIT/ML FlexPen Generic drug: insulin detemir Inject 30 Units into the skin at bedtime.   lisinopril 40 MG tablet Commonly known as: ZESTRIL Take 40 mg by mouth daily.   metFORMIN 500 MG tablet Commonly known as: GLUCOPHAGE Take 1,000 mg by mouth 2 (two) times daily with a meal.   pravastatin 20 MG tablet Commonly known as: PRAVACHOL Take 20 mg by mouth every evening.   predniSONE 50 MG tablet Commonly known as: DELTASONE Take the one tablet 13 hours, 7 hours and 1 hours prior to the CT Urogram Started by: Jameson Tormey, PA-C   rosuvastatin 10 MG tablet Commonly known as: CRESTOR Take by mouth.   tadalafil 5 MG tablet Commonly known as: CIALIS Take 1 tablet (5 mg total) by mouth daily. What changed: See the new instructions. Changed by: Carollee Herter  Loreal Schuessler, PA-C       Allergies:  Allergies  Allergen Reactions   Dulaglutide Rash   Shellfish Allergy Anaphylaxis   Penicillins Nausea And Vomiting    Has patient had a PCN reaction causing immediate rash, facial/tongue/throat swelling, SOB or lightheadedness with hypotension: yes rash Has patient had a PCN reaction causing severe rash involving mucus membranes or skin necrosis:no Has patient had a PCN reaction that required hospitalization no Has patient had a PCN reaction occurring within the last 10 years: yes If all of the above answers are "NO", then may proceed with Cephalosporin use.     Family History: Family History  Problem Relation Age of Onset   Breast cancer Mother    Kidney cancer Neg Hx    Kidney disease Neg Hx    Prostate cancer Neg Hx     Social History:  reports that he quit smoking about 4 years ago. His smoking use included cigarettes. He has never used smokeless tobacco. He reports that he does not drink alcohol and does not  use drugs.  ROS: Pertinent ROS in HPI  Physical Exam: BP (!) 153/94    Pulse 96    Ht 5\' 6"  (1.676 m)    Wt 265 lb (120.2 kg)    BMI 42.77 kg/m   Constitutional:  Well nourished. Alert and oriented, No acute distress. HEENT: [place] AT, mask in place.  Trachea midline Cardiovascular: No clubbing, cyanosis, or edema. Respiratory: Normal respiratory effort, no increased work of breathing. GU: No CVA tenderness.  No bladder fullness or masses.  Patient with circumcised phallus. Urethral meatus is patent.  No penile discharge. No penile lesions or rashes. Scrotum without lesions, cysts, rashes and/or edema.  Testicles are located scrotally bilaterally. No masses are appreciated in the testicles. Left and right epididymis are normal. Rectal:Not performed Skin: No rashes, bruises or suspicious lesions. Lymph: No  inguinal adenopathy. Neurologic: Grossly intact, no focal deficits, moving all 4 extremities. Psychiatric: Normal mood and affect.  Laboratory Data: Lab Results  Component Value Date   WBC 8.0 04/03/2019   HGB 14.5 04/03/2019   HCT 44.1 04/03/2019   MCV 78.3 (L) 04/03/2019   PLT 154 04/03/2019    Lab Results  Component Value Date   CREATININE 1.16 04/03/2019    Lab Results  Component Value Date   HGBA1C 7.5 (H) 05/08/2016    Lab Results  Component Value Date   AST 25 04/03/2019   Lab Results  Component Value Date   ALT 27 04/03/2019    Urinalysis    Component Value Date/Time   COLORURINE YELLOW (A) 04/03/2019 0946   APPEARANCEUR CLEAR (A) 04/03/2019 0946   APPEARANCEUR Clear 07/21/2016 1505   LABSPEC 1.027 04/03/2019 0946   PHURINE 6.0 04/03/2019 0946   GLUCOSEU >=500 (A) 04/03/2019 0946   HGBUR NEGATIVE 04/03/2019 0946   BILIRUBINUR NEGATIVE 04/03/2019 0946   BILIRUBINUR Negative 07/21/2016 1505   KETONESUR 20 (A) 04/03/2019 0946   PROTEINUR NEGATIVE 04/03/2019 0946   NITRITE NEGATIVE 04/03/2019 0946   LEUKOCYTESUR NEGATIVE 04/03/2019 0946    I have  reviewed the labs.   Pertinent Imaging: CLINICAL DATA:  Fever, chills, hematuria, history of kidney stones  EXAM: CT ABDOMEN AND PELVIS WITHOUT CONTRAST  TECHNIQUE: Multidetector CT imaging of the abdomen and pelvis was performed following the standard protocol without IV contrast. Sagittal and coronal MPR images reconstructed from axial data set. Oral contrast not administered for this indication  COMPARISON:  None  FINDINGS: Lower chest: Lung  bases clear  Hepatobiliary: Gallbladder and liver normal appearance  Pancreas: Normal appearance  Spleen: Chart indicates a history of splenectomy but a normal sized spleen versus splenule is identified in the LEFT upper quadrant  Adrenals/Urinary Tract: Adrenal glands normal appearance. Question minimal infiltration of peripelvic/renal sinus fat at RIGHT kidney. Kidneys, ureters, and bladder otherwise normal appearance. No urinary tract calcification or dilatation.  Stomach/Bowel: Normal appendix. Stomach and bowel loops normal appearance.  Vascular/Lymphatic: Atherosclerotic calcifications iliac arteries. Few pelvic phleboliths. Aorta normal caliber. No adenopathy.  Reproductive: Unremarkable prostate gland and seminal vesicles  Other: Umbilical hernia containing fat. No free air or free fluid.  Musculoskeletal: Unremarkable  IMPRESSION: Question minimal infiltration of peripelvic/renal sinus fat at RIGHT kidney, cannot exclude pyelonephritis; correlation with urinalysis recommended.  No urinary tract calcification or dilatation.  Normal spleen versus large splenule LEFT upper quadrant in patient with reported history of splenectomy.  Umbilical hernia containing fat.   Electronically Signed   By: Ulyses Southward M.D.   On: 04/03/2019 11:48 I have independently reviewed the films.  See HPI.    Assessment & Plan:   1. Erectile dysfunction SHIM score is 12  Continue Cialis 5 mg daily Check  testosterone level and TSH RTC in 12 months for repeat SHIM score and exam   2. Infiltrative process noted in the peripelvic/renal sinus of the right kidney Will obtain a repeat CT scan at this time to ensure this infiltrative process has resolved.  Although his symptoms resolved after taking an antibiotic, his urine in the emergency room was not impressive and his WBC count was normal making the diagnosis of pyelonephritis problematic.  Our concern at this time that this may be more ominous pathology.  He does have anaphylactic reaction listed to seafood in his chart, but he has been tested through ENT and does not have a seafood allergy.  He is also received contrast in the past without incident.  I have prescribed the allergy prep for the patient. I will call him with results   Return in about 1 year (around 08/24/2020) for SHIM and exam.  These notes generated with voice recognition software. I apologize for typographical errors.  Michiel Cowboy, PA-C  [facility] [address]  [address] [place], [place] [address] [phone]  I spent 30 minutes on the day of the encounter to include pre-visit record review, face-to-face time with the patient, and post-visit ordering of tests.

## 2019-08-25 ENCOUNTER — Other Ambulatory Visit: Payer: Self-pay

## 2019-08-25 ENCOUNTER — Ambulatory Visit (INDEPENDENT_AMBULATORY_CARE_PROVIDER_SITE_OTHER): Payer: Managed Care, Other (non HMO) | Admitting: Urology

## 2019-08-25 ENCOUNTER — Encounter: Payer: Self-pay | Admitting: Urology

## 2019-08-25 VITALS — BP 153/94 | HR 96 | Ht 66.0 in | Wt 265.0 lb

## 2019-08-25 DIAGNOSIS — N12 Tubulo-interstitial nephritis, not specified as acute or chronic: Secondary | ICD-10-CM

## 2019-08-25 DIAGNOSIS — N529 Male erectile dysfunction, unspecified: Secondary | ICD-10-CM | POA: Diagnosis not present

## 2019-08-25 MED ORDER — TADALAFIL 5 MG PO TABS: 5.0000 mg | ORAL_TABLET | Freq: Every day | ORAL | 3 refills | Status: DC

## 2019-08-26 LAB — TESTOSTERONE: Testosterone: 223 ng/dL — ABNORMAL LOW (ref 264–916)

## 2019-08-26 LAB — TSH: TSH: 0.813 u[IU]/mL (ref 0.450–4.500)

## 2019-08-29 ENCOUNTER — Other Ambulatory Visit: Payer: Self-pay | Admitting: Family Medicine

## 2019-08-29 ENCOUNTER — Telehealth: Payer: Self-pay | Admitting: Family Medicine

## 2019-08-29 DIAGNOSIS — E291 Testicular hypofunction: Secondary | ICD-10-CM

## 2019-08-29 NOTE — Telephone Encounter (Signed)
Patient notified and voiced understanding. Appointment for blood work has been made.

## 2019-08-29 NOTE — Telephone Encounter (Signed)
-----   Message from Harle Battiest, PA-C sent at 08/29/2019  6:37 AM EDT ----- Please let Dave Conrad know that his testosterone level was below normal.  We need to have him repeat the testosterone level before 10 am to confirm.

## 2019-09-01 ENCOUNTER — Other Ambulatory Visit: Payer: Self-pay

## 2019-09-01 ENCOUNTER — Other Ambulatory Visit: Payer: Managed Care, Other (non HMO)

## 2019-09-01 DIAGNOSIS — E291 Testicular hypofunction: Secondary | ICD-10-CM

## 2019-09-02 LAB — TESTOSTERONE: Testosterone: 212 ng/dL — ABNORMAL LOW (ref 264–916)

## 2019-09-05 ENCOUNTER — Telehealth: Payer: Self-pay | Admitting: Family Medicine

## 2019-09-05 ENCOUNTER — Ambulatory Visit: Payer: Managed Care, Other (non HMO)

## 2019-09-05 NOTE — Telephone Encounter (Signed)
Call from DIS from [facility].  Patient is contact to syphilis and needs treatment.  Patient is allergic to PCN Wendi Snipes, RN

## 2019-09-07 ENCOUNTER — Other Ambulatory Visit: Payer: Self-pay

## 2019-09-07 ENCOUNTER — Ambulatory Visit: Payer: Self-pay | Admitting: Physician Assistant

## 2019-09-07 DIAGNOSIS — Z113 Encounter for screening for infections with a predominantly sexual mode of transmission: Secondary | ICD-10-CM

## 2019-09-07 DIAGNOSIS — Z202 Contact with and (suspected) exposure to infections with a predominantly sexual mode of transmission: Secondary | ICD-10-CM

## 2019-09-07 MED ORDER — DOXYCYCLINE HYCLATE 100 MG PO TABS: 100.0000 mg | ORAL_TABLET | Freq: Two times a day (BID) | ORAL | 0 refills | Status: AC

## 2019-09-08 ENCOUNTER — Telehealth: Payer: Self-pay | Admitting: Urology

## 2019-09-08 MED ORDER — PREDNISONE 50 MG PO TABS
ORAL_TABLET | ORAL | 0 refills | Status: DC
Start: 2019-09-08 — End: 2020-10-16

## 2019-09-08 MED ORDER — DIPHENHYDRAMINE HCL 50 MG PO TABS
ORAL_TABLET | ORAL | 0 refills | Status: DC
Start: 2019-09-08 — End: 2020-10-16

## 2019-09-08 NOTE — Telephone Encounter (Signed)
Patient returned call, advised per instructions. Voiced understanding. Scheduled appointment.

## 2019-09-08 NOTE — Telephone Encounter (Signed)
Left pt message to call °

## 2019-09-08 NOTE — Telephone Encounter (Signed)
Please let Mr. Lieurance know that both of his testosterone levels returned below 300, so he has met criteria for testosterone cypionate.  If he is wanting to start testosterone therapy, he will need an appointment to discuss these options.

## 2019-09-10 ENCOUNTER — Encounter: Payer: Self-pay | Admitting: Physician Assistant

## 2019-09-10 NOTE — Progress Notes (Signed)
Chart reviewed by Pharmacist  Suzanne Walker PharmD, Contract Pharmacist at [facility]  

## 2019-09-10 NOTE — Progress Notes (Signed)
[facility] Department STI clinic/screening visit  Subjective:  Dave Conrad is a 44 y.o. male being seen today for an STI screening visit. The patient reports they do not have symptoms.    Patient has the following medical conditions:   Patient Active Problem List   Diagnosis Date Noted  . Type 2 diabetes mellitus without complication, without long-term current use of insulin (HCC) 06/26/2016  . Essential hypertension 06/26/2016  . Right kidney stone 06/22/2016  . Ureteral obstruction, right 05/27/2016  . Malnutrition of moderate degree 05/09/2016  . Asplenia 05/08/2016  . Sepsis (HCC) 05/07/2016  . 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 Positive serology for syphilis 10/30/2015  . Morbid obesity with BMI of 40.0-44.9, adult (HCC) 10/30/2015  . HIV positive (HCC) 10/30/2015  . H/O splenectomy 10/30/2015  . Human immunodeficiency virus (HIV) disease (HCC) 12/31/2014  . History of syphilis 12/31/2014  . 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 SEXUALLY TRANSMITTED DISEASE    screening    HPI  Patient reports that he was called by someone and told that he was a contact to Syphilis.  States that he has a history of Syphilis and that his titer is stable at 1: 2 or 1:4.  States that he is checked annually with PCP and last checked in 2020.  Reports that he has not had any symptoms and would like testing and treatment today.  Reports that he sees his PCP regularly and takes meds for HTN and DM.     See flowsheet for further details and programmatic requirements.    The following portions of the patient's history were reviewed and updated as appropriate: allergies, current medications, past medical history, past social history, past surgical history and problem list.  Objective:  There were no vitals filed for this visit.  Physical Exam Constitutional:      General: He is not in acute distress.    Appearance: Normal  appearance.  HENT:     Head: Normocephalic and atraumatic.     Mouth/Throat:     Mouth: Mucous membranes are moist.     Pharynx: Oropharynx is clear. No oropharyngeal exudate or posterior oropharyngeal erythema.  Pulmonary:     Effort: Pulmonary effort is normal.  Musculoskeletal:     Cervical back: Neck supple.  Lymphadenopathy:     Cervical: No cervical adenopathy.  Skin:    General: Skin is warm and dry.     Findings: No bruising, erythema, lesion or rash.  Neurological:     Mental Status: He is alert and oriented to person, place, and time.  Psychiatric:        Mood and Affect: Mood normal.        Behavior: Behavior normal.        Thought Content: Thought content normal.        Judgment: Judgment normal.       Assessment and Plan:  Dave Conrad is a 44 y.o. male presenting to the [facility] Department for STI screening  1. Screening for STD (sexually transmitted disease) Patient in as a contact to Syphilis.  Declines screening exam for GC and NGU today. Rec condoms with all sex. Await test results.  Counseled that RN will call if he needs to RTC for further treatment.  - Syphilis Serology, [facility]  2. Syphilis contact Patient treated with Doxycycline 100 mg #28 1 po BID for 7 days. Counseled to abstain from sex  for 14 days, until meds have been completed and results are back.  Call with questions or concerns. - doxycycline (VIBRA-TABS) 100 MG tablet; Take 1 tablet (100 mg total) by mouth 2 (two) times daily for 14 days.  Dispense: 28 tablet; Refill: 0     No follow-ups on file.  Future Appointments  Date Time Provider Department Center  09/26/2019  2:30 PM Harle Battiest, PA-C [facility] None  08/30/2020  9:00 AM McGowan, [place], PA-C [facility] None    Matt Holmes, [place]

## 2019-09-13 ENCOUNTER — Telehealth: Payer: Self-pay | Admitting: Family Medicine

## 2019-09-13 NOTE — Telephone Encounter (Signed)
Call to patient.  Verified by password.  Patient has history of positive syphilis.  Patient informed of increase of titer.  Patient informed of need to take all of medications prescribed at visit, no sex for next 14 days or with partner(s) until 14 days after their treatment. Patient verbalizes understanding. Wendi Snipes, RN

## 2019-09-22 ENCOUNTER — Other Ambulatory Visit: Payer: Self-pay

## 2019-09-22 ENCOUNTER — Ambulatory Visit
Admission: RE | Admit: 2019-09-22 | Discharge: 2019-09-22 | Disposition: A | Discharge status: Home / Self Care | Payer: Managed Care, Other (non HMO) | Source: Ambulatory Visit | Attending: Urology | Admitting: Urology

## 2019-09-22 DIAGNOSIS — N12 Tubulo-interstitial nephritis, not specified as acute or chronic: Secondary | ICD-10-CM | POA: Diagnosis present

## 2019-09-22 LAB — POCT I-STAT CREATININE: Creatinine, Ser: 1.2 mg/dL (ref 0.61–1.24)

## 2019-09-22 MED ORDER — IOHEXOL 300 MG/ML  SOLN
100.0000 mL | Freq: Once | INTRAMUSCULAR | Status: AC | PRN
  Administered 2019-09-22: 100 mL via INTRAVENOUS

## 2019-09-26 ENCOUNTER — Ambulatory Visit (INDEPENDENT_AMBULATORY_CARE_PROVIDER_SITE_OTHER): Payer: Managed Care, Other (non HMO) | Admitting: Urology

## 2019-09-26 ENCOUNTER — Encounter: Payer: Self-pay | Admitting: Urology

## 2019-09-26 ENCOUNTER — Other Ambulatory Visit: Payer: Self-pay

## 2019-09-26 VITALS — BP 147/87 | HR 81 | Ht 66.0 in | Wt 290.0 lb

## 2019-09-26 DIAGNOSIS — N12 Tubulo-interstitial nephritis, not specified as acute or chronic: Secondary | ICD-10-CM | POA: Diagnosis not present

## 2019-09-26 DIAGNOSIS — N529 Male erectile dysfunction, unspecified: Secondary | ICD-10-CM

## 2019-09-26 DIAGNOSIS — N138 Other obstructive and reflux uropathy: Secondary | ICD-10-CM

## 2019-09-26 DIAGNOSIS — E349 Endocrine disorder, unspecified: Secondary | ICD-10-CM

## 2019-09-26 DIAGNOSIS — N401 Enlarged prostate with lower urinary tract symptoms: Secondary | ICD-10-CM

## 2019-09-26 MED ORDER — TESTOSTERONE CYPIONATE 200 MG/ML IM SOLN
200.0000 mg | INTRAMUSCULAR | 0 refills | Status: DC
Start: 2019-09-26 — End: 2019-12-11

## 2019-09-26 NOTE — Progress Notes (Signed)
09/26/2019 8:41 PM   Dave Conrad 1975/09/15 [id number]  Referring provider: [facility] [address] [place],  [place] [address]  Chief Complaint  Patient presents with  . Erectile Dysfunction    HPI: Dave Conrad is a 44 y.o. diabetic male with ED who presents today to discuss testosterone deficiency treatment and to review CT scan results.    Testosterone deficiency He is still having spontaneous erections at night.   He does not have sleep apnea.   He is reporting human immunodeficiency virus (HIV).   His pretreatment testosterone level was: Component     Latest Ref Rng & Units 08/25/2019 09/01/2019  Testosterone     264 - 916 ng/dL 223 (L) 212 (L)   He had been on AndroGel in the past.    Erectile dysfunction SHIM score: 12 - without medications Main complaint: Maintaining erections  Risk factors:  age, BPH, DM, HTN and HLD  No painful erections or curvatures with his erections.    Still having spontaneous erections.  Tried:  Cialis 5 mg daily with great results     BPH WITH LUTS  (prostate and/or bladder) IPSS score: 1/1    Major complaint(s): None.  Denies any dysuria, hematuria or suprapubic pain.   Denies any recent fevers, chills, nausea or vomiting.  PSA 0.11 in 08/2019  UA negative 03/2019   IPSS    Row Name 09/26/19 1400         International Prostate Symptom Score   How often have you had the sensation of not emptying your bladder? Not at All     How often have you had to urinate less than every two hours? Not at All     How often have you found you stopped and started again several times when you urinated? Not at All     How often have you found it difficult to postpone urination? Not at All     How often have you had a weak urinary stream? Not at All     How often have you had to strain to start urination? Not at All     How many times did you typically get up at night to urinate? 1 Time     Total IPSS Score 1       Quality of  Life due to urinary symptoms   If you were to spend the rest of your life with your urinary condition just the way it is now how would you feel about that? Pleased            Score:  1-7 Mild 8-19 Moderate 20-35 Severe  Patient was seen in the ED on 04/03/2019 and diagnosed with pyelonephritis.  CT renal stone study noted Adrenal glands normal appearance. Question minimal infiltration of peripelvic/renal sinus fat at RIGHT kidney.  Kidneys, ureters, and bladder otherwise normal appearance. No urinary tract calcification or dilatation.  Unremarkable prostate gland and seminal vesicles.  His UA was not impressive at that time and WBC count was normal at 8.0.  He stated he was having fevers, dysuria and redness at his meatus.  He felt better after taking an antibiotic.   Repeated contrast CT 08/2019 Normal appearance of the adrenal glands.  No kidney stones or enhancing mass identified bilaterally. Small low-density structure within the lateral aspect of the right mid kidney measures 6 mm and is too small to characterize. No perinephric fat stranding, hydronephrosis or striated nephrograms identified. The urinary bladder appears  normal.    PMH: Past Medical History:  Diagnosis Date  . Asplenia 05/08/2016  . Diabetes mellitus without complication (HCC)   . H/O sepsis 05/06/2016   due to right kidney stone  . History of kidney stones   . HIV (human immunodeficiency virus infection) (HCC)   . Hypertension     Surgical History: Past Surgical History:  Procedure Laterality Date  . CYSTOSCOPY W/ RETROGRADES Bilateral 07/13/2016   Procedure: CYSTOSCOPY WITH RETROGRADE PYELOGRAM;  Surgeon: Vanna Scotland, MD;  Location: [facility];  Service: Urology;  Laterality: Bilateral;  . CYSTOSCOPY WITH STENT PLACEMENT Right 05/09/2016   Procedure: CYSTOSCOPY WITH STENT PLACEMENT and retrorade pyelogram;  Surgeon: Crist Fat, MD;  Location: [facility];  Service: Urology;  Laterality: Right;  .  CYSTOSCOPY WITH URETHRAL DILATATION N/A 06/22/2016   Procedure: CYSTOSCOPY WITH URETHRAL DILATATION;  Surgeon: Vanna Scotland, MD;  Location: [facility];  Service: Urology;  Laterality: N/A;  . CYSTOSCOPY/URETEROSCOPY/HOLMIUM LASER/STENT PLACEMENT Right 07/13/2016   Procedure: CYSTOSCOPY/URETEROSCOPY/HOLMIUM LASER/STENT EXCHANGE;  Surgeon: Vanna Scotland, MD;  Location: [facility];  Service: Urology;  Laterality: Right;  . HOLMIUM LASER APPLICATION N/A 06/22/2016   Procedure: HOLMIUM LASER APPLICATION;  Surgeon: Vanna Scotland, MD;  Location: [facility];  Service: Urology;  Laterality: N/A;  . IR NEPHROSTOMY PLACEMENT RIGHT  06/22/2016  . NEPHROLITHOTOMY Right 06/22/2016   Procedure: NEPHROLITHOTOMY PERCUTANEOUS;  Surgeon: Vanna Scotland, MD;  Location: [facility];  Service: Urology;  Laterality: Right;  . SPLENECTOMY, TOTAL  1982  . URETEROSCOPY Right 06/22/2016   Procedure: ANTEGRADE URETEROSCOPY;  Surgeon: Vanna Scotland, MD;  Location: [facility];  Service: Urology;  Laterality: Right;    Home Medications:  Allergies as of 09/26/2019      Reactions   Dulaglutide Rash   Shellfish Allergy Anaphylaxis   Penicillins Nausea And Vomiting   Has patient had a PCN reaction causing immediate rash, facial/tongue/throat swelling, SOB or lightheadedness with hypotension: yes rash Has patient had a PCN reaction causing severe rash involving mucus membranes or skin necrosis:no Has patient had a PCN reaction that required hospitalization no Has patient had a PCN reaction occurring within the last 10 years: yes If all of the above answers are "NO", then may proceed with Cephalosporin use.      Medication List       Accurate as of September 26, 2019  8:41 PM. If you have any questions, ask your nurse or doctor.        aspirin EC 81 MG tablet Take 81 mg by mouth at bedtime.   Basaglar KwikPen 100 UNIT/ML Inject 20 Units into the skin at bedtime.   Biktarvy 50-200-25 MG Tabs tablet Generic drug:  bictegravir-emtricitabine-tenofovir AF TAKE 1 TABLET BY MOUTH ONCE DAILY IN PLACE OF ATRIPLA   CENTRUM ULTRA MENS PO Take 1 tablet by mouth daily.   diltiazem 180 MG 24 hr capsule Commonly known as: DILACOR XR Take 180 mg by mouth daily.   diltiazem 240 MG 24 hr capsule Commonly known as: CARDIZEM CD Take 240 mg by mouth daily.   diphenhydrAMINE 50 MG tablet Commonly known as: BENADRYL Take one hour prior to the CT scan   efavirenz-emtricitabine-tenofovir 600-200-300 MG tablet Commonly known as: ATRIPLA Take 1 tablet by mouth at bedtime.   EPINEPHrine 0.3 mg/0.3 mL Soaj injection Commonly known as: EPI-PEN SMARTSIG:1 Pre-Filled Pen Syringe IM PRN   glipiZIDE 10 MG 24 hr tablet Commonly known as: GLUCOTROL XL Take 5-10 mg by mouth 2 (two) times daily. 10 mg in  the morning & 5 mg in the evening   hydrochlorothiazide 25 MG tablet Commonly known as: HYDRODIURIL Take 1 tablet by mouth daily.   latanoprost 0.005 % ophthalmic solution Commonly known as: XALATAN   Levemir FlexTouch 100 UNIT/ML FlexPen Generic drug: insulin detemir Inject 30 Units into the skin at bedtime.   lisinopril 40 MG tablet Commonly known as: ZESTRIL Take 40 mg by mouth daily.   metFORMIN 500 MG tablet Commonly known as: GLUCOPHAGE Take 1,000 mg by mouth 2 (two) times daily with a meal.   pravastatin 20 MG tablet Commonly known as: PRAVACHOL Take 20 mg by mouth every evening.   predniSONE 50 MG tablet Commonly known as: DELTASONE Take the one tablet 13 hours, 7 hours and 1 hours prior to the CT Urogram   rosuvastatin 10 MG tablet Commonly known as: CRESTOR Take by mouth.   tadalafil 5 MG tablet Commonly known as: CIALIS Take 1 tablet (5 mg total) by mouth daily.   testosterone cypionate 200 MG/ML injection Commonly known as: DEPOTESTOSTERONE CYPIONATE Inject 1 mL (200 mg total) into the muscle every 14 (fourteen) days. Started by: Zara Council, PA-C       Allergies:   Allergies  Allergen Reactions  . Dulaglutide Rash  . Shellfish Allergy Anaphylaxis  . Penicillins Nausea And Vomiting    Has patient had a PCN reaction causing immediate rash, facial/tongue/throat swelling, SOB or lightheadedness with hypotension: yes rash Has patient had a PCN reaction causing severe rash involving mucus membranes or skin necrosis:no Has patient had a PCN reaction that required hospitalization no Has patient had a PCN reaction occurring within the last 10 years: yes If all of the above answers are "NO", then may proceed with Cephalosporin use.     Family History: Family History  Problem Relation Age of Onset  . Breast cancer Mother   . Kidney cancer Neg Hx   . Kidney disease Neg Hx   . Prostate cancer Neg Hx     Social History:  reports that he quit smoking about 4 years ago. His smoking use included cigarettes. He has never used smokeless tobacco. He reports that he does not drink alcohol and does not use drugs.  ROS: Pertinent ROS in HPI  Physical Exam: BP (!) 147/87   Pulse 81   Ht '5\' 6"'$  (1.676 m)   Wt 290 lb (131.5 kg)   BMI 46.81 kg/m   Constitutional:  Well nourished. Alert and oriented, No acute distress. HEENT: [place] AT, mask in place.  Trachea midline Cardiovascular: No clubbing, cyanosis, or edema. Respiratory: Normal respiratory effort, no increased work of breathing. Neurologic: Grossly intact, no focal deficits, moving all 4 extremities. Psychiatric: Normal mood and affect.   Laboratory Data: Lab Results  Component Value Date   WBC 8.0 04/03/2019   HGB 14.5 04/03/2019   HCT 44.1 04/03/2019   MCV 78.3 (L) 04/03/2019   PLT 154 04/03/2019    Lab Results  Component Value Date   CREATININE 1.20 09/22/2019    Lab Results  Component Value Date   HGBA1C 7.5 (H) 05/08/2016    Lab Results  Component Value Date   AST 25 04/03/2019   Lab Results  Component Value Date   ALT 27 04/03/2019    Urinalysis    Component Value  Date/Time   COLORURINE YELLOW (A) 04/03/2019 0946   APPEARANCEUR CLEAR (A) 04/03/2019 0946   APPEARANCEUR Clear 07/21/2016 1505   LABSPEC 1.027 04/03/2019 0946   PHURINE 6.0 04/03/2019 0946  GLUCOSEU >=500 (A) 04/03/2019 0946   HGBUR NEGATIVE 04/03/2019 0946   BILIRUBINUR NEGATIVE 04/03/2019 0946   BILIRUBINUR Negative 07/21/2016 1505   KETONESUR 20 (A) 04/03/2019 0946   PROTEINUR NEGATIVE 04/03/2019 0946   NITRITE NEGATIVE 04/03/2019 0946   LEUKOCYTESUR NEGATIVE 04/03/2019 0946    I have reviewed the labs.   Pertinent Imaging: CLINICAL DATA:  Pyelonephritis, uncomplicated.  EXAM: CT ABDOMEN AND PELVIS WITHOUT AND WITH CONTRAST  TECHNIQUE: Multidetector CT imaging of the abdomen and pelvis was performed following the standard protocol before and following the bolus administration of intravenous contrast.  CONTRAST:  183m OMNIPAQUE IOHEXOL 300 MG/ML  SOLN  COMPARISON:  04/03/2019  FINDINGS: Lower chest: Lung bases are clear.  Hepatobiliary: No focal liver abnormality is seen. No gallstones, gallbladder wall thickening, or biliary dilatation.  Pancreas: Unremarkable. No pancreatic ductal dilatation or surrounding inflammatory changes.  Spleen: Normal in size without focal abnormality.  Adrenals/Urinary Tract: Normal appearance of the adrenal glands.  No kidney stones or enhancing mass identified bilaterally. Small low-density structure within the lateral aspect of the right mid kidney measures 6 mm and is too small to characterize. No perinephric fat stranding, hydronephrosis or striated nephrograms identified. The urinary bladder appears normal.  Stomach/Bowel: Stomach is unremarkable. The small bowel loops are nondilated. The appendix is visualized and appears normal. No bowel wall thickening, inflammation or distension identified at this time.  Vascular/Lymphatic: No significant vascular findings are present. No enlarged abdominal or pelvic  lymph nodes.  Reproductive: Prostate is unremarkable.  Other: No free fluid or fluid collections identified within the abdomen or pelvis. 3.6 x 2.6 cm periumbilical hernia contains fat only, image 54/4.  Musculoskeletal: No acute or significant osseous findings.  IMPRESSION: 1. No acute findings within the abdomen or pelvis. No complications of pyelonephritis identified. 2. Fat only containing periumbilical hernia.   Electronically Signed   By: TKerby MoorsM.D.   On: 09/22/2019 13:23 I have independently reviewed the films.  See HPI.    Assessment & Plan:   1. Testosterone deficiency Met criteria for testosterone deficiency with two morning serum total testosterone levels at least 2 days apart that is below 300 ng/dL  Advised the patient that low testosterone levels is a risk factor for CVD Patient is not interested in fertility I discussed with the patient the side effects of testosterone therapy, such as: enlargement of the prostate gland that may in turn cause LUTS, possible increased risk of PCa, DVT's and/or PE's, possible increased risk of heart attack or stroke, lower sperm count, swelling of the ankles, feet, or body, with or without heart failure, enlarged or painful breasts, have problems breathing while you sleep (sleep apnea), increased prostate specific antigen, mood swings, hypertension and increased red blood cell count - we will need to check HCT/HBG levels prior to therapy and PSA if the patient is over 40 As testosterone therapy type is driven by insurance formularies, his insurance has testosterone cypionate listed as preferred agent He will start the testosterone cypionate 200 mg/IM, 1 cc every 14 days-he will RTC to be instructed on self injections He then will have his testosterone level repeated (one week after his fourth injection) along with HCT, hemoglobin, prolactic and LH/FSH  2. Erectile dysfunction Continue Cialis 5 mg daily RTC in 6 months  for repeat SHIM score and exam   3. Infiltrative process noted in the peripelvic/renal sinus of the right kidney Resolved  4. BPH with LUTS IPSS score is 1/1 Continue conservative management, avoiding  bladder irritants and timed voiding's RTC in 6 months for IPSS, PSA, PVR and exam   Return for return for self injection teaching .  These notes generated with voice recognition software. I apologize for typographical errors.  Zara Council, PA-C  [facility] [address]  [place], [place] [address] [phone]

## 2019-09-28 ENCOUNTER — Other Ambulatory Visit: Payer: Self-pay

## 2019-09-28 ENCOUNTER — Ambulatory Visit (INDEPENDENT_AMBULATORY_CARE_PROVIDER_SITE_OTHER): Payer: Managed Care, Other (non HMO) | Admitting: Urology

## 2019-09-28 ENCOUNTER — Encounter: Payer: Self-pay | Admitting: Urology

## 2019-09-28 VITALS — BP 150/87 | HR 86 | Ht 66.0 in | Wt 292.8 lb

## 2019-09-28 DIAGNOSIS — E349 Endocrine disorder, unspecified: Secondary | ICD-10-CM | POA: Diagnosis not present

## 2019-09-28 MED ORDER — SYRINGE 2-3 ML 3 ML MISC: 1.0000 mg | 3 refills | Status: AC

## 2019-09-28 MED ORDER — BD DISP NEEDLES 21G X 1-1/2" MISC
1.0000 mg | 0 refills | Status: DC
Start: 2019-09-28 — End: 2019-12-25

## 2019-09-28 MED ORDER — "BD DISP NEEDLES 18G X 1-1/2"" MISC": 1.0000 mg | 0 refills | Status: AC

## 2019-09-28 NOTE — Progress Notes (Signed)
Dave Conrad is a 44 y.o.  male with testosterone deficiency who presents today for instruction on delivering an IM injection of testosterone cypionate.    I instructed the patient to identify the concentration of his testosterone. Testosterone for injection is usually in the form of testosterone cypionate. These liquids come in multiple concentrations, so before giving an injection, it's very important to make sure that his intended dosage takes into account the concentration of the testosterone serum. Usually, testosterone comes in a concentration of either 100 mg/ml or 200 mg/ml.  We typically use the 200 mg/mL in this office.    Patient's bottle is testosterone cypionate 200 mg/mL.  Lot # [id number]   Exp. Date 02/2021  Using a sterile, 18 G needle and 3 cc syringe, the testosterone cypionate was drawn up for a 200 cc injection.  To draw up the dose, I demonstrated how to first draw air into the syringe equal to the volume of the dosage. Then, wipe the top of the medication bottle with an alcohol wipe, insert the needle through the lid and into the medication, and push the air from your syringe into the bottle. Turn the bottle upside down and draw out the exact dosage of testosterone.  I demonstrated how to aspirate the syringe by hinge the syringe with its needle uncapped and pointing up in front of him.  Looking for air bubbles in the syringe. Flick the side of the syringe to get these bubbles to rise to the top.    When the dosage is bubble-free, I slowly depressed the plunger to force the air at the top of the syringe out stopping when a tiny drop of medication comes out of the tip of the syringe.  I advised him to be certain no air remained in the syringe as injecting air is very dangerous.  Being careful not to squirt or spray a significant portion of the dosage onto the floor.  Preparing the injection site, outer middle third of the vastus lateralis muscle of the thigh, I took a sterile alcohol  pad and wipe the immediate area around where I intended him to inject.   I then demonstrated how to change the needle from the 18 G to the 21 G needle.  I then gave him the syringe for injecting.  He correctly identified the injection location.  He held the syringe like a dart at a 90-degree angle above the sterile injection site. Quickly plunged it into the flesh. Before depressing the plunger, he drew back on it slightly and no blood was seen.  I advised him that if blood flashed in the syringe, he would need to remove the needle and then select a different location as he was in the vein.  Inject the medication at a steady, controlled pace.  Patient did not feel comfortable administering the injection in the left gluteus maximus.   I advised him that If, after injection, he experienced redness, swelling, or discomfort beyond that of normal soreness at the site of injection, call our office for an appointment and instructions.  He is to always store his medication at the recommended temperature, and always check the expiration date on the bottle. If it's expired, don't use it.  Of course, keep all of med's out of reach of children.  Do not change his dose without consulting your provider.  His starting dose will be 1 cc every 2 weeks.  He will return 1 week after his fourth injection for a testosterone level  Patrick Sohm, PA-C    I, Theador Hawthorne, am acting as a Neurosurgeon for [employer],  I have reviewed the above documentation for accuracy and completeness, and I agree with the above.    Michiel Cowboy, PA-C

## 2019-11-15 ENCOUNTER — Other Ambulatory Visit: Payer: Self-pay | Admitting: Family Medicine

## 2019-11-15 DIAGNOSIS — E349 Endocrine disorder, unspecified: Secondary | ICD-10-CM

## 2019-11-16 ENCOUNTER — Other Ambulatory Visit: Payer: Managed Care, Other (non HMO)

## 2019-11-16 ENCOUNTER — Other Ambulatory Visit: Payer: Self-pay

## 2019-11-16 DIAGNOSIS — E349 Endocrine disorder, unspecified: Secondary | ICD-10-CM

## 2019-11-17 ENCOUNTER — Telehealth: Payer: Self-pay | Admitting: Family Medicine

## 2019-11-17 LAB — TESTOSTERONE: Testosterone: 664 ng/dL (ref 264–916)

## 2019-11-17 NOTE — Telephone Encounter (Signed)
-----   Message from Harle Battiest, PA-C sent at 11/17/2019  8:19 AM EDT ----- Please let Dave Conrad know that his testosterone is at therapeutic levels and to continue with his present dose.  We need to repeat his labs in 3 months (testosterone level 1 week after injection, hemoglobin and hematocrit)

## 2019-11-17 NOTE — Telephone Encounter (Signed)
LMOM for patient to return call and schedule a lab appointment in 3 months 1 week after injection.

## 2019-12-03 ENCOUNTER — Other Ambulatory Visit: Payer: Self-pay

## 2019-12-03 ENCOUNTER — Other Ambulatory Visit: Payer: Self-pay | Admitting: Urology

## 2019-12-07 ENCOUNTER — Other Ambulatory Visit: Payer: Self-pay | Admitting: Urology

## 2019-12-07 ENCOUNTER — Other Ambulatory Visit: Payer: Self-pay

## 2019-12-08 ENCOUNTER — Other Ambulatory Visit: Payer: Self-pay | Admitting: Urology

## 2019-12-08 NOTE — Telephone Encounter (Signed)
Patient states he was supposed to take his injection yesterday, but he is all out of medication. Looks like he was seen in august.

## 2019-12-08 NOTE — Telephone Encounter (Signed)
Dave Conrad will need to have his follow-up appointment scheduled for either December or early January for repeat blood work (testosterone - one week after injection, HCT, Hgb, PSA), I PSS, SHIM and exam prior to refilling his testosterone prescription.

## 2019-12-10 NOTE — Telephone Encounter (Signed)
From my telephone note on 10/15 Mr. Dave Conrad will need to have his follow-up appointment scheduled for either December or early January for repeat blood work (testosterone - one week after injection, HCT, Hgb, PSA), I PSS, SHIM and exam prior to refilling his testosterone prescription.

## 2019-12-11 NOTE — Telephone Encounter (Signed)
Patient notified and appointments have been scheduled. He needs a refill of the testosterone.

## 2019-12-11 NOTE — Addendum Note (Signed)
Addended by: Honor Loh on: 12/11/2019 02:07 PM   Modules accepted: Orders

## 2019-12-13 ENCOUNTER — Other Ambulatory Visit: Payer: Self-pay

## 2019-12-15 MED ORDER — TESTOSTERONE CYPIONATE 200 MG/ML IM SOLN: 200.0000 mg | INTRAMUSCULAR | 0 refills | Status: DC

## 2019-12-18 NOTE — Addendum Note (Signed)
Addended by: Ples Specter L on: 12/18/2019 08:00 AM   Modules accepted: Orders

## 2019-12-20 NOTE — Telephone Encounter (Signed)
Epic says this prescription was refilled 5 days ago.  Would you call the patient and confirm this?  And would you also ask him what size vials his pharmacy gives him.

## 2019-12-22 ENCOUNTER — Encounter: Payer: Self-pay | Admitting: Physician Assistant

## 2019-12-22 ENCOUNTER — Ambulatory Visit: Payer: Self-pay | Admitting: Physician Assistant

## 2019-12-22 ENCOUNTER — Other Ambulatory Visit: Payer: Self-pay

## 2019-12-22 DIAGNOSIS — Z113 Encounter for screening for infections with a predominantly sexual mode of transmission: Secondary | ICD-10-CM

## 2019-12-22 NOTE — Progress Notes (Signed)
[facility] Department STI clinic/screening visit  Subjective:  Dave Conrad is a 44 y.o. male being seen today for an STI screening visit. The patient reports they do not have symptoms.    Patient has the following medical conditions:   Patient Active Problem List   Diagnosis Date Noted  . Type 2 diabetes mellitus without complication, without long-term current use of insulin (HCC) 06/26/2016  . Essential hypertension 06/26/2016  . Right kidney stone 06/22/2016  . Ureteral obstruction, right 05/27/2016  . Malnutrition of moderate degree 05/09/2016  . Asplenia 05/08/2016  . Sepsis (HCC) 05/07/2016  . 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 Positive serology for syphilis 10/30/2015  . Morbid obesity with BMI of 40.0-44.9, adult (HCC) 10/30/2015  . HIV positive (HCC) 10/30/2015  . H/O splenectomy 10/30/2015  . Human immunodeficiency virus (HIV) disease (HCC) 12/31/2014  . History of syphilis 12/31/2014  . 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 SEXUALLY TRANSMITTED DISEASE    screening    HPI  Patient reports he is not having any symptoms but would like a re-screening for GC and Chlamydia. Reports that he had GC about 3-4 months ago and wants to make sure he has not been reinfected.  Reports that he is followed regularly for his chronic conditions.   See flowsheet for further details and programmatic requirements.    The following portions of the patient's history were reviewed and updated as appropriate: allergies, current medications, past medical history, past social history, past surgical history and problem list.  Objective:  There were no vitals filed for this visit.  Physical Exam Constitutional:      General: He is not in acute distress.    Appearance: Normal appearance.  HENT:     Head: Normocephalic and atraumatic.     Comments: No nits,lice, or hair loss. No cervical, supraclavicular or axillary  adenopathy.    Mouth/Throat:     Mouth: Mucous membranes are moist.     Pharynx: Oropharynx is clear. No oropharyngeal exudate or posterior oropharyngeal erythema.  Eyes:     Conjunctiva/sclera: Conjunctivae normal.  Pulmonary:     Effort: Pulmonary effort is normal.  Abdominal:     Palpations: Abdomen is soft. There is no mass.     Tenderness: There is no abdominal tenderness. There is no guarding or rebound.  Musculoskeletal:     Cervical back: Neck supple. No tenderness.  Skin:    General: Skin is warm and dry.     Findings: No bruising, erythema, lesion or rash.  Neurological:     Mental Status: He is alert and oriented to person, place, and time.  Psychiatric:        Mood and Affect: Mood normal.        Behavior: Behavior normal.        Thought Content: Thought content normal.        Judgment: Judgment normal.       Assessment and Plan:  Dave Conrad is a 44 y.o. male presenting to the [facility] Department for STI screening  1. Screening for STD (sexually transmitted disease) Patient into clinic without symptoms.  Patient declines blood work and states that he had this done with PCP 2 weeks ago. Rec condoms with all sex. Await test results.  Counseled that RN will call if needs to RTC for treatment once results are back. - Chlamydia/Gonorrhea [facility] - Chlamydia/Gonorrhea [facility] - Chlamydia/Gonorrhea  [facility]     No follow-ups on file.  Future Appointments  Date Time Provider Department Center  02/08/2020  9:30 AM [facility] None  02/14/2020  9:00 AM McGowan, Carollee Herter A, PA-C [facility] None  08/30/2020  9:00 AM McGowan, Wellington [place], PA-C [facility] None    Matt Holmes, [place]

## 2019-12-23 ENCOUNTER — Other Ambulatory Visit: Payer: Self-pay | Admitting: Urology

## 2020-01-02 ENCOUNTER — Telehealth: Payer: Self-pay | Admitting: Family Medicine

## 2020-01-02 NOTE — Telephone Encounter (Signed)
Phone call to pt. Pt states he has already reviewed his results on My Chart and does not need any further assistance at this time.

## 2020-01-02 NOTE — Telephone Encounter (Signed)
Patient is calling for results

## 2020-02-05 ENCOUNTER — Other Ambulatory Visit: Payer: Self-pay | Admitting: Family Medicine

## 2020-02-05 DIAGNOSIS — N138 Other obstructive and reflux uropathy: Secondary | ICD-10-CM

## 2020-02-05 DIAGNOSIS — E349 Endocrine disorder, unspecified: Secondary | ICD-10-CM

## 2020-02-08 ENCOUNTER — Other Ambulatory Visit: Payer: Self-pay

## 2020-02-08 ENCOUNTER — Other Ambulatory Visit: Payer: Managed Care, Other (non HMO)

## 2020-02-08 DIAGNOSIS — N138 Other obstructive and reflux uropathy: Secondary | ICD-10-CM

## 2020-02-08 DIAGNOSIS — E349 Endocrine disorder, unspecified: Secondary | ICD-10-CM

## 2020-02-09 LAB — TESTOSTERONE: Testosterone: 756 ng/dL (ref 264–916)

## 2020-02-09 LAB — HEMOGLOBIN: Hemoglobin: 14.7 g/dL (ref 13.0–17.7)

## 2020-02-09 LAB — HEMATOCRIT: Hematocrit: 45.7 % (ref 37.5–51.0)

## 2020-02-09 LAB — PSA: Prostate Specific Ag, Serum: 0.3 ng/mL (ref 0.0–4.0)

## 2020-02-13 NOTE — Progress Notes (Deleted)
02/14/2020 8:33 AM   Dave Conrad 1975-11-07 [id number]  Referring provider: [facility] [place],  [place] [address]  No chief complaint on file.   HPI: Dave Conrad is a 44 y.o. diabetic male with ED and testosterone deficiency treatment who presents today for follow up.   Testosterone deficiency He is still having spontaneous erections at night.   He does not have sleep apnea.   He is reporting human immunodeficiency virus (HIV).   He has been on AndroGel in the past.  He is managing his testosterone deficiency with  testosterone cypionate 200 mg/IM, 1 cc every 14 days.  His pretreatment testosterone level was: Component     Latest Ref Rng & Units 02/08/2020  Testosterone     264 - 916 ng/dL 756   Component     Latest Ref Rng & Units 02/08/2020          HCT     37.5 - 51.0 % 45.7   Component     Latest Ref Rng & Units 02/08/2020          Hemoglobin     13.0 - 17.7 g/dL 14.7    Erectile dysfunction Previous SHIM score: 12 - without medications Main complaint: Maintaining erections  Risk factors:  age, BPH, DM, HTN and HLD  No painful erections or curvatures with his erections.    Still having spontaneous erections.  Tried:  Cialis 5 mg daily with great results     BPH WITH LUTS  (prostate and/or bladder) Previous IPSS score: 1/1    Major complaint(s): ***  Patient denies any modifying or aggravating factors.  Patient denies any gross hematuria, dysuria or suprapubic/flank pain.  Patient denies any fevers, chills, nausea or vomiting.  UA negative 03/2019  Component     Latest Ref Rng & Units 02/08/2020  Prostate Specific Ag, Serum     0.0 - 4.0 ng/mL 0.3      Score:  1-7 Mild 8-19 Moderate 20-35 Severe    PMH: Past Medical History:  Diagnosis Date  . Asplenia 05/08/2016  . Diabetes mellitus without complication ([place])   . H/O sepsis 05/06/2016   due to right kidney stone  . History of kidney stones   . HIV (human  immunodeficiency virus infection) ([place])   . Hypertension     Surgical History: Past Surgical History:  Procedure Laterality Date  . CYSTOSCOPY W/ RETROGRADES Bilateral 07/13/2016   Procedure: CYSTOSCOPY WITH RETROGRADE PYELOGRAM;  Surgeon: Hollice Espy, MD;  Location: [facility];  Service: Urology;  Laterality: Bilateral;  . CYSTOSCOPY WITH STENT PLACEMENT Right 05/09/2016   Procedure: CYSTOSCOPY WITH STENT PLACEMENT and retrorade pyelogram;  Surgeon: Ardis Hughs, MD;  Location: [facility];  Service: Urology;  Laterality: Right;  . CYSTOSCOPY WITH URETHRAL DILATATION N/A 06/22/2016   Procedure: CYSTOSCOPY WITH URETHRAL DILATATION;  Surgeon: Hollice Espy, MD;  Location: [facility];  Service: Urology;  Laterality: N/A;  . CYSTOSCOPY/URETEROSCOPY/HOLMIUM LASER/STENT PLACEMENT Right 07/13/2016   Procedure: CYSTOSCOPY/URETEROSCOPY/HOLMIUM LASER/STENT EXCHANGE;  Surgeon: Hollice Espy, MD;  Location: [facility];  Service: Urology;  Laterality: Right;  . HOLMIUM LASER APPLICATION N/A 1/91/4782   Procedure: HOLMIUM LASER APPLICATION;  Surgeon: Hollice Espy, MD;  Location: [facility];  Service: Urology;  Laterality: N/A;  . IR NEPHROSTOMY PLACEMENT RIGHT  06/22/2016  . NEPHROLITHOTOMY Right 06/22/2016   Procedure: NEPHROLITHOTOMY PERCUTANEOUS;  Surgeon: Hollice Espy, MD;  Location: [facility];  Service: Urology;  Laterality: Right;  . SPLENECTOMY, TOTAL  1982  . URETEROSCOPY Right 06/22/2016   Procedure: ANTEGRADE URETEROSCOPY;  Surgeon: Hollice Espy, MD;  Location: [facility];  Service: Urology;  Laterality: Right;    Home Medications:  Allergies as of 02/14/2020      Reactions   Dulaglutide Rash   Shellfish Allergy Anaphylaxis   Penicillins Nausea And Vomiting   Has patient had a PCN reaction causing immediate rash, facial/tongue/throat swelling, SOB or lightheadedness with hypotension: yes rash Has patient had a PCN reaction causing severe rash involving mucus membranes or skin  necrosis:no Has patient had a PCN reaction that required hospitalization no Has patient had a PCN reaction occurring within the last 10 years: yes If all of the above answers are "NO", then may proceed with Cephalosporin use.      Medication List       Accurate as of February 13, 2020  8:33 AM. If you have any questions, ask your nurse or doctor.        2-3CC SYRINGE 3 ML Misc 1 mg by Does not apply route every 14 (fourteen) days.   aspirin EC 81 MG tablet Take 81 mg by mouth at bedtime.   B-D ULTRAFINE III SHORT PEN 31G X 8 MM Misc Generic drug: Insulin Pen Needle See admin instructions.   Basaglar KwikPen 100 UNIT/ML Inject 20 Units into the skin at bedtime.   BD Disp Needles 18G X 1-1/2" Misc Generic drug: NEEDLE (DISP) 18 G 1 mg by Does not apply route every 14 (fourteen) days.   BD Disp Needles 21G X 1-1/2" Misc Generic drug: NEEDLE (DISP) 21 G USE 1  EVERY TWO WEEKS   Biktarvy 50-200-25 MG Tabs tablet Generic drug: bictegravir-emtricitabine-tenofovir AF TAKE 1 TABLET BY MOUTH ONCE DAILY IN PLACE OF ATRIPLA   CENTRUM ULTRA MENS PO Take 1 tablet by mouth daily.   diltiazem 180 MG 24 hr capsule Commonly known as: DILACOR XR Take 180 mg by mouth daily.   diltiazem 240 MG 24 hr capsule Commonly known as: CARDIZEM CD Take 240 mg by mouth daily.   diltiazem 240 MG 24 hr capsule Commonly known as: TIAZAC Take by mouth.   diphenhydrAMINE 50 MG tablet Commonly known as: BENADRYL Take one hour prior to the CT scan   efavirenz-emtricitabine-tenofovir 600-200-300 MG tablet Commonly known as: ATRIPLA Take 1 tablet by mouth at bedtime.   EPINEPHrine 0.3 mg/0.3 mL Soaj injection Commonly known as: EPI-PEN SMARTSIG:1 Pre-Filled Pen Syringe IM PRN   FreeStyle Libre 14 Day Sensor Misc SMARTSIG:1 Kit(s) Topical Every 2 Weeks   glipiZIDE 10 MG 24 hr tablet Commonly known as: GLUCOTROL XL Take 5-10 mg by mouth 2 (two) times daily. 10 mg in the morning & 5 mg in  the evening   HumuLIN R 100 units/mL injection Generic drug: insulin regular SLIDING SCALE INSULIN 151 200 2 UNITS 201 250 4 UNITS 251 300 6 UNITS 301 350 8 UNITS 351 400 10 UNITS IF OVER 400 CALL MD   hydrochlorothiazide 25 MG tablet Commonly known as: HYDRODIURIL Take 1 tablet by mouth daily.   latanoprost 0.005 % ophthalmic solution Commonly known as: XALATAN   Levemir FlexTouch 100 UNIT/ML FlexPen Generic drug: insulin detemir Inject 30 Units into the skin at bedtime.   lisinopril 40 MG tablet Commonly known as: ZESTRIL Take 40 mg by mouth daily.   metFORMIN 500 MG tablet Commonly known as: GLUCOPHAGE Take 1,000 mg by mouth 2 (two) times daily with a meal.   phentermine 15 MG capsule Take by mouth.  pravastatin 20 MG tablet Commonly known as: PRAVACHOL Take 20 mg by mouth every evening.   predniSONE 50 MG tablet Commonly known as: DELTASONE Take the one tablet 13 hours, 7 hours and 1 hours prior to the CT Urogram   rosuvastatin 10 MG tablet Commonly known as: CRESTOR Take by mouth.   tadalafil 5 MG tablet Commonly known as: CIALIS Take 1 tablet (5 mg total) by mouth daily.   testosterone cypionate 200 MG/ML injection Commonly known as: DEPOTESTOSTERONE CYPIONATE Inject 1 mL (200 mg total) into the muscle every 14 (fourteen) days.   triamcinolone 0.1 % Commonly known as: KENALOG Apply topically.       Allergies:  Allergies  Allergen Reactions  . Dulaglutide Rash  . Shellfish Allergy Anaphylaxis  . Penicillins Nausea And Vomiting    Has patient had a PCN reaction causing immediate rash, facial/tongue/throat swelling, SOB or lightheadedness with hypotension: yes rash Has patient had a PCN reaction causing severe rash involving mucus membranes or skin necrosis:no Has patient had a PCN reaction that required hospitalization no Has patient had a PCN reaction occurring within the last 10 years: yes If all of the above answers are "NO", then may proceed  with Cephalosporin use.     Family History: Family History  Problem Relation Age of Onset  . Breast cancer Mother   . Kidney cancer Neg Hx   . Kidney disease Neg Hx   . Prostate cancer Neg Hx     Social History:  reports that he quit smoking about 4 years ago. His smoking use included cigarettes. He has never used smokeless tobacco. He reports that he does not drink alcohol and does not use drugs.  ROS: Pertinent ROS in HPI  Physical Exam: There were no vitals taken for this visit.  Constitutional:  Well nourished. Alert and oriented, No acute distress. HEENT: [place] AT, mask in place.  Trachea midline Cardiovascular: No clubbing, cyanosis, or edema. Respiratory: Normal respiratory effort, no increased work of breathing. Neurologic: Grossly intact, no focal deficits, moving all 4 extremities. Psychiatric: Normal mood and affect.   Laboratory Data: Lab Results  Component Value Date   WBC 8.0 04/03/2019   HGB 14.7 02/08/2020   HCT 45.7 02/08/2020   MCV 78.3 (L) 04/03/2019   PLT 154 04/03/2019    Lab Results  Component Value Date   CREATININE 1.20 09/22/2019    Lab Results  Component Value Date   AST 25 04/03/2019   Lab Results  Component Value Date   ALT 27 04/03/2019    Urinalysis    Component Value Date/Time   COLORURINE YELLOW (A) 04/03/2019 0946   APPEARANCEUR CLEAR (A) 04/03/2019 0946   APPEARANCEUR Clear 07/21/2016 1505   LABSPEC 1.027 04/03/2019 0946   PHURINE 6.0 04/03/2019 0946   GLUCOSEU >=500 (A) 04/03/2019 0946   HGBUR NEGATIVE 04/03/2019 0946   BILIRUBINUR NEGATIVE 04/03/2019 0946   BILIRUBINUR Negative 07/21/2016 1505   KETONESUR 20 (A) 04/03/2019 0946   PROTEINUR NEGATIVE 04/03/2019 0946   NITRITE NEGATIVE 04/03/2019 0946   LEUKOCYTESUR NEGATIVE 04/03/2019 0946    I have reviewed the labs.   Pertinent Imaging: No imaging since last visit  Assessment & Plan:   1. Testosterone deficiency Testosterone at therapeutic levels He will  start the testosterone cypionate 200 mg/IM, 1 cc every 14 days-he will RTC to be instructed on self injections He then will have his testosterone level repeated (one week after his fourth injection) along with HCT, hemoglobin, prolactic and LH/FSH  2. Erectile dysfunction Continue Cialis 5 mg daily RTC in 6 months for repeat SHIM score and exam   3. BPH with LUTS IPSS score is 1/1 Continue conservative management, avoiding bladder irritants and timed voiding's RTC in 6 months for IPSS, PSA, PVR and exam   No follow-ups on file.  These notes generated with voice recognition software. I apologize for typographical errors.  Zara Council, PA-C  [facility] [address]  [place], [place] [address] [phone]

## 2020-02-14 ENCOUNTER — Ambulatory Visit: Payer: Managed Care, Other (non HMO) | Admitting: Urology

## 2020-02-27 NOTE — Progress Notes (Signed)
02/28/2020 10:35 PM   Dave Conrad 01-17-1976 [id number]  Referring provider: [facility] [address] [place],  [place] [address]  Chief Complaint  Patient presents with  . testosterone deficiency   Urological history: 1. Testosterone deficiency - testosterone 756 ng/dL in 01/2020 - Hct and Hgb WNL - managed with testosterone cypionate 200mg /mL, 1 cc every 14 days  2. ED - SHIM 25 - contributing factors of age, BPH, DM, HTN and HLD  - managed with tadalafil 5 mg daily  3. BPH with LU TS - PSA 0.3 in 01/2020 - I PSS 1/0  HPI: Dave Conrad is a 45 y.o. diabetic male who presents today for 6 month follow up.   He is still having spontaneous erections at night.  He does not have sleep apnea.  He denies any painful or occurs with his erections.  He has no urinary complaints at this time.  Patient denies any modifying or aggravating factors.  Patient denies any gross hematuria, dysuria or suprapubic/flank pain.  Patient denies any fevers, chills, nausea or vomiting.     SHIM    Row Name 02/28/20 1146         SHIM: Over the last 6 months:   How do you rate your confidence that you could get and keep an erection? Very High     When you had erections with sexual stimulation, how often were your erections hard enough for penetration (entering your partner)? Almost Always or Always     During sexual intercourse, how often were you able to maintain your erection after you had penetrated (entered) your partner? Almost Always or Always     During sexual intercourse, how difficult was it to maintain your erection to completion of intercourse? Not Difficult     When you attempted sexual intercourse, how often was it satisfactory for you? Almost Always or Always           SHIM Total Score   SHIM 25            IPSS    Row Name 02/28/20 1100         International Prostate Symptom Score   How often have you had the sensation of not emptying your bladder? Not  at All     How often have you had to urinate less than every two hours? Not at All     How often have you found you stopped and started again several times when you urinated? Not at All     How often have you found it difficult to postpone urination? Not at All     How often have you had a weak urinary stream? Not at All     How often have you had to strain to start urination? Not at All     How many times did you typically get up at night to urinate? 1 Time     Total IPSS Score 1           Quality of Life due to urinary symptoms   If you were to spend the rest of your life with your urinary condition just the way it is now how would you feel about that? Delighted            Score:  1-7 Mild 8-19 Moderate 20-35 Severe    PMH: Past Medical History:  Diagnosis Date  . Asplenia 05/08/2016  . Diabetes mellitus without complication ([place])   . H/O sepsis 05/06/2016  due to right kidney stone  . History of kidney stones   . HIV (human immunodeficiency virus infection) ([place])   . Hypertension     Surgical History: Past Surgical History:  Procedure Laterality Date  . CYSTOSCOPY W/ RETROGRADES Bilateral 07/13/2016   Procedure: CYSTOSCOPY WITH RETROGRADE PYELOGRAM;  Surgeon: Hollice Espy, MD;  Location: [facility];  Service: Urology;  Laterality: Bilateral;  . CYSTOSCOPY WITH STENT PLACEMENT Right 05/09/2016   Procedure: CYSTOSCOPY WITH STENT PLACEMENT and retrorade pyelogram;  Surgeon: Ardis Hughs, MD;  Location: [facility];  Service: Urology;  Laterality: Right;  . CYSTOSCOPY WITH URETHRAL DILATATION N/A 06/22/2016   Procedure: CYSTOSCOPY WITH URETHRAL DILATATION;  Surgeon: Hollice Espy, MD;  Location: [facility];  Service: Urology;  Laterality: N/A;  . CYSTOSCOPY/URETEROSCOPY/HOLMIUM LASER/STENT PLACEMENT Right 07/13/2016   Procedure: CYSTOSCOPY/URETEROSCOPY/HOLMIUM LASER/STENT EXCHANGE;  Surgeon: Hollice Espy, MD;  Location: [facility];  Service: Urology;  Laterality: Right;   . HOLMIUM LASER APPLICATION N/A 11/15/3005   Procedure: HOLMIUM LASER APPLICATION;  Surgeon: Hollice Espy, MD;  Location: [facility];  Service: Urology;  Laterality: N/A;  . IR NEPHROSTOMY PLACEMENT RIGHT  06/22/2016  . NEPHROLITHOTOMY Right 06/22/2016   Procedure: NEPHROLITHOTOMY PERCUTANEOUS;  Surgeon: Hollice Espy, MD;  Location: [facility];  Service: Urology;  Laterality: Right;  . SPLENECTOMY, TOTAL  1982  . URETEROSCOPY Right 06/22/2016   Procedure: ANTEGRADE URETEROSCOPY;  Surgeon: Hollice Espy, MD;  Location: [facility];  Service: Urology;  Laterality: Right;    Home Medications:  Allergies as of 02/28/2020      Reactions   Dulaglutide Rash   Shellfish Allergy Anaphylaxis   Penicillins Nausea And Vomiting   Has patient had a PCN reaction causing immediate rash, facial/tongue/throat swelling, SOB or lightheadedness with hypotension: yes rash Has patient had a PCN reaction causing severe rash involving mucus membranes or skin necrosis:no Has patient had a PCN reaction that required hospitalization no Has patient had a PCN reaction occurring within the last 10 years: yes If all of the above answers are "NO", then may proceed with Cephalosporin use.      Medication List       Accurate as of February 28, 2020 11:59 PM. If you have any questions, ask your nurse or doctor.        STOP taking these medications   diltiazem 180 MG 24 hr capsule Commonly known as: DILACOR XR Stopped by: Ravis Herne, PA-C   diltiazem 240 MG 24 hr capsule Commonly known as: TIAZAC Stopped by: Zara Council, PA-C     TAKE these medications   2-3CC SYRINGE 3 ML Misc 1 mg by Does not apply route every 14 (fourteen) days.   aspirin EC 81 MG tablet Take 81 mg by mouth at bedtime.   B-D ULTRAFINE III SHORT PEN 31G X 8 MM Misc Generic drug: Insulin Pen Needle See admin instructions.   Basaglar KwikPen 100 UNIT/ML Inject 20 Units into the skin at bedtime.   BD Disp Needles 18G X 1-1/2"  Misc Generic drug: NEEDLE (DISP) 18 G 1 mg by Does not apply route every 14 (fourteen) days.   BD Disp Needles 21G X 1-1/2" Misc Generic drug: NEEDLE (DISP) 21 G USE 1  EVERY TWO WEEKS   Biktarvy 50-200-25 MG Tabs tablet Generic drug: bictegravir-emtricitabine-tenofovir AF TAKE 1 TABLET BY MOUTH ONCE DAILY IN PLACE OF ATRIPLA   CENTRUM ULTRA MENS PO Take 1 tablet by mouth daily.   diltiazem 240 MG 24 hr capsule Commonly known as: CARDIZEM CD Take  240 mg by mouth daily.   diphenhydrAMINE 50 MG tablet Commonly known as: BENADRYL Take one hour prior to the CT scan   efavirenz-emtricitabine-tenofovir 600-200-300 MG tablet Commonly known as: ATRIPLA Take 1 tablet by mouth at bedtime.   EPINEPHrine 0.3 mg/0.3 mL Soaj injection Commonly known as: EPI-PEN SMARTSIG:1 Pre-Filled Pen Syringe IM PRN   FreeStyle Libre 14 Day Sensor Misc SMARTSIG:1 Kit(s) Topical Every 2 Weeks   glipiZIDE 10 MG 24 hr tablet Commonly known as: GLUCOTROL XL Take 5-10 mg by mouth 2 (two) times daily. 10 mg in the morning & 5 mg in the evening   HumuLIN R 100 units/mL injection Generic drug: insulin regular SLIDING SCALE INSULIN 151 200 2 UNITS 201 250 4 UNITS 251 300 6 UNITS 301 350 8 UNITS 351 400 10 UNITS IF OVER 400 CALL MD   hydrochlorothiazide 25 MG tablet Commonly known as: HYDRODIURIL Take 1 tablet by mouth daily.   Insulin NPH (Human) ([place]) 100 UNIT/ML Kiwkpen Commonly known as: HUMULIN N Inject into the skin.   HumuLIN N KwikPen 100 UNIT/ML Kiwkpen Generic drug: Insulin NPH (Human) ([place]) Inject into the skin.   latanoprost 0.005 % ophthalmic solution Commonly known as: XALATAN   Levemir FlexTouch 100 UNIT/ML FlexPen Generic drug: insulin detemir Inject 30 Units into the skin at bedtime.   lisinopril 40 MG tablet Commonly known as: ZESTRIL Take 40 mg by mouth daily.   metFORMIN 500 MG tablet Commonly known as: GLUCOPHAGE Take 1,000 mg by mouth 2 (two) times daily  with a meal.   omeprazole 20 MG tablet Commonly known as: PRILOSEC OTC Take by mouth.   phentermine 15 MG capsule Take by mouth.   pravastatin 20 MG tablet Commonly known as: PRAVACHOL Take 20 mg by mouth every evening.   predniSONE 50 MG tablet Commonly known as: DELTASONE Take the one tablet 13 hours, 7 hours and 1 hours prior to the CT Urogram   rosuvastatin 10 MG tablet Commonly known as: CRESTOR Take by mouth.   tadalafil 5 MG tablet Commonly known as: CIALIS Take 1 tablet (5 mg total) by mouth daily.   testosterone cypionate 200 MG/ML injection Commonly known as: DEPOTESTOSTERONE CYPIONATE Inject 1 mL (200 mg total) into the muscle every 14 (fourteen) days.   triamcinolone 0.1 % Commonly known as: KENALOG Apply topically.       Allergies:  Allergies  Allergen Reactions  . Dulaglutide Rash  . Shellfish Allergy Anaphylaxis  . Penicillins Nausea And Vomiting    Has patient had a PCN reaction causing immediate rash, facial/tongue/throat swelling, SOB or lightheadedness with hypotension: yes rash Has patient had a PCN reaction causing severe rash involving mucus membranes or skin necrosis:no Has patient had a PCN reaction that required hospitalization no Has patient had a PCN reaction occurring within the last 10 years: yes If all of the above answers are "NO", then may proceed with Cephalosporin use.     Family History: Family History  Problem Relation Age of Onset  . Breast cancer Mother   . Kidney cancer Neg Hx   . Kidney disease Neg Hx   . Prostate cancer Neg Hx     Social History:  reports that he quit smoking about 4 years ago. His smoking use included cigarettes. He has never used smokeless tobacco. He reports that he does not drink alcohol and does not use drugs.  ROS: Pertinent ROS in HPI  Physical Exam: BP (!) 152/96   Pulse 92   Ht $R'5\' 6"'ss$  (  1.676 m)   Wt 290 lb (131.5 kg)   BMI 46.81 kg/m   Constitutional:  Well nourished. Alert and  oriented, No acute distress. HEENT: [place] AT, mask in place.  Trachea midline Cardiovascular: No clubbing, cyanosis, or edema. Respiratory: Normal respiratory effort, no increased work of breathing. GU: No CVA tenderness.  No bladder fullness or masses.  Patient with uncircumcised phallus. Foreskin easily retracted Urethral meatus is patent.  No penile discharge. No penile lesions or rashes. Scrotum without lesions, cysts, rashes and/or edema.  Testicles are located scrotally bilaterally. No masses are appreciated in the testicles. Left and right epididymis are normal. Rectal: Patient with  normal sphincter tone. Anus and perineum without scarring or rashes. No rectal masses are appreciated. Prostate is approximately 45 grams, could only palpate the apex, no nodules are appreciated. Seminal vesicles could not be palpated Neurologic: Grossly intact, no focal deficits, moving all 4 extremities. Psychiatric: Normal mood and affect.   Laboratory Data: Specimen:  Blood  Ref Range & Units 3 mo ago Comments  Sodium 135 - 145 mmol/L 136    Potassium 3.5 - 5.0 mmol/L 3.8    Chloride 98 - 108 mmol/L 102    Carbon Dioxide (CO2) 21 - 30 mmol/L 25    Urea Nitrogen (BUN) 7 - 20 mg/dL 9    Creatinine 0.6 - 1.3 mg/dL 1.0    Glucose 70 - 140 mg/dL 130     Specimen:  Blood  Ref Range & Units 1 mo ago  Hemoglobin A1C <6.5 % 9.8High   Average Blood Glucose (Calculated From HgBA1c Level) mg/dL 245   Resulting [place]   Narrative Performed by [facility] POC TEST(S) ABOVE PERFORMED AT THE PATIENT CARE LOCATION AND OVERSEEN BY THE [facility] POCT PROGRAM. Specimen Collected: 02/01/20 11:14 AM Last Resulted: 02/01/20 11:33 AM  Received From: [facility]  Result Received: 02/05/20 8:15 AM  I have reviewed the labs.   Pertinent Imaging: No imaging since last visit  Assessment & Plan:   1. Testosterone deficiency Testosterone at therapeutic levels He  will continue testosterone cypionate 200 mg/milliliters, 1 cc every 14 days  2. Erectile dysfunction SHIM 25 Continue Cialis 5 mg daily  3. BPH with LUTS IPSS score is 1/0 Continue conservative management, avoiding bladder irritants and timed voiding's   Return in about 6 months (around 08/27/2020) for IPSS, SHIM and exam, PSA, testosterone (one week after injection) H & H.  These notes generated with voice recognition software. I apologize for typographical errors.  Zara Council, PA-C  [facility] [address]  [place],  [address] [phone]

## 2020-02-28 ENCOUNTER — Ambulatory Visit (INDEPENDENT_AMBULATORY_CARE_PROVIDER_SITE_OTHER): Payer: Managed Care, Other (non HMO) | Admitting: Urology

## 2020-02-28 ENCOUNTER — Other Ambulatory Visit: Payer: Self-pay

## 2020-02-28 VITALS — BP 152/96 | HR 92 | Ht 66.0 in | Wt 290.0 lb

## 2020-02-28 DIAGNOSIS — N401 Enlarged prostate with lower urinary tract symptoms: Secondary | ICD-10-CM | POA: Diagnosis not present

## 2020-02-28 DIAGNOSIS — E349 Endocrine disorder, unspecified: Secondary | ICD-10-CM

## 2020-02-28 DIAGNOSIS — N529 Male erectile dysfunction, unspecified: Secondary | ICD-10-CM

## 2020-02-28 DIAGNOSIS — N138 Other obstructive and reflux uropathy: Secondary | ICD-10-CM

## 2020-02-28 MED ORDER — TESTOSTERONE CYPIONATE 200 MG/ML IM SOLN: 200.0000 mg | INTRAMUSCULAR | 0 refills | Status: DC

## 2020-03-26 ENCOUNTER — Encounter: Payer: Self-pay | Admitting: Urology

## 2020-04-29 ENCOUNTER — Other Ambulatory Visit: Payer: Self-pay | Admitting: Urology

## 2020-07-18 ENCOUNTER — Other Ambulatory Visit: Payer: Self-pay

## 2020-07-19 MED ORDER — TESTOSTERONE CYPIONATE 200 MG/ML IM SOLN: INTRAMUSCULAR | 0 refills | Status: DC

## 2020-08-18 ENCOUNTER — Other Ambulatory Visit: Payer: Self-pay | Admitting: Urology

## 2020-08-19 MED ORDER — TADALAFIL 5 MG PO TABS: 5.0000 mg | ORAL_TABLET | Freq: Every day | ORAL | 3 refills | Status: DC

## 2020-08-27 ENCOUNTER — Other Ambulatory Visit: Payer: Self-pay

## 2020-08-27 DIAGNOSIS — N529 Male erectile dysfunction, unspecified: Secondary | ICD-10-CM

## 2020-08-27 DIAGNOSIS — E349 Endocrine disorder, unspecified: Secondary | ICD-10-CM

## 2020-08-27 DIAGNOSIS — N138 Other obstructive and reflux uropathy: Secondary | ICD-10-CM

## 2020-08-30 ENCOUNTER — Ambulatory Visit: Payer: Managed Care, Other (non HMO) | Admitting: Urology

## 2020-09-03 ENCOUNTER — Other Ambulatory Visit: Payer: Managed Care, Other (non HMO)

## 2020-09-11 ENCOUNTER — Ambulatory Visit: Payer: Managed Care, Other (non HMO) | Admitting: Urology

## 2020-09-18 NOTE — Progress Notes (Signed)
09/19/2020 12:08 PM   Dave Conrad 12/04/1975 [id number]  Referring provider: [facility] [address] [place],  [place] [address]  Urological history: 1. Testosterone deficiency -contributing factors of age, diabetes, HIV and obesity -testosterone pending -Hct and Hgb pending -managed with testosterone cypionate 241m/mL, 1 cc every 14 days  2. ED - SHIM 24 - contributing factors of age, BPH, DM, HTN and HLD  - managed with tadalafil 5 mg daily  3. BPH with LU TS - PSA pending  - I PSS 2/2  4.  Nephrolithiasis -Stone composition of 50% calcium phosphate, 40% magnesium ammonium phosphate and 10% ammonia acid urate  -staged procedure with PCNL 2018 -No stone seen on triphasic study in 2021  5. Urethral stricture -dilated bulbar urethral stricture 2018   Chief Complaint  Patient presents with   Follow-up     HPI: Dave Conrad a 45y.o. diabetic male who presents today for 6 month follow up.   Patient still having spontaneous erections.  He denies any pain or curvature with erections.      SHIM     Row Name 09/19/20 1146         SHIM: Over the last 6 months:   How do you rate your confidence that you could get and keep an erection? High     When you had erections with sexual stimulation, how often were your erections hard enough for penetration (entering your partner)? Almost Always or Always     During sexual intercourse, how often were you able to maintain your erection after you had penetrated (entered) your partner? Almost Always or Always     During sexual intercourse, how difficult was it to maintain your erection to completion of intercourse? Not Difficult     When you attempted sexual intercourse, how often was it satisfactory for you? Almost Always or Always           SHIM Total Score     SHIM 24             Score: 1-7 Severe ED 8-11 Moderate ED 12-16 Mild-Moderate ED 17-21 Mild ED 22-25 No ED   He has no urinary  complaints.  Patient denies any modifying or aggravating factors.  Patient denies any gross hematuria, dysuria or suprapubic/flank pain.  Patient denies any fevers, chills, nausea or vomiting.     IPSS     Row Name 09/19/20 1100         International Prostate Symptom Score   How often have you had the sensation of not emptying your bladder? Not at All     How often have you had to urinate less than every two hours? Not at All     How often have you found you stopped and started again several times when you urinated? Not at All     How often have you found it difficult to postpone urination? Not at All     How often have you had a weak urinary stream? Not at All     How often have you had to strain to start urination? Not at All     How many times did you typically get up at night to urinate? 2 Times     Total IPSS Score 2           Quality of Life due to urinary symptoms     If you were to spend the rest of your life with your urinary condition  just the way it is now how would you feel about that? Mostly Satisfied              Score:  1-7 Mild 8-19 Moderate 20-35 Severe   PMH: Past Medical History:  Diagnosis Date   Asplenia 05/08/2016   Diabetes mellitus without complication ([place])    H/O sepsis 05/06/2016   due to right kidney stone   History of kidney stones    HIV (human immunodeficiency virus infection) ([place])    Hypertension     Surgical History: Past Surgical History:  Procedure Laterality Date   CYSTOSCOPY W/ RETROGRADES Bilateral 07/13/2016   Procedure: CYSTOSCOPY WITH RETROGRADE PYELOGRAM;  Surgeon: Hollice Espy, MD;  Location: [facility] OR[facility];  Service: Urology;  Laterality: Bilateral;   CYSTOSCOPY WITH STENT PLACEMENT Right 05/09/2016   Procedure: CYSTOSCOPY WITH STENT PLACEMENT and retrorade pyelogram;  Surgeon: Ardis Hughs, MD;  Location: [facility];  Service: Urology;  Laterality: Right;   CYSTOSCOPY WITH URETHRAL DILATATION N/A 06/22/2016   Procedure:  CYSTOSCOPY WITH URETHRAL DILATATION;  Surgeon: Hollice Espy, MD;  Location: [facility];  Service: Urology;  Laterality: N/A;   CYSTOSCOPY/URETEROSCOPY/HOLMIUM LASER/STENT PLACEMENT Right 07/13/2016   Procedure: CYSTOSCOPY/URETEROSCOPY/HOLMIUM LASER/STENT EXCHANGE;  Surgeon: Hollice Espy, MD;  Location: [facility];  Service: Urology;  Laterality: Right;   HOLMIUM LASER APPLICATION N/A 05/08/1759   Procedure: HOLMIUM LASER APPLICATION;  Surgeon: Hollice Espy, MD;  Location: [facility];  Service: Urology;  Laterality: N/A;   IR NEPHROSTOMY PLACEMENT RIGHT  06/22/2016   NEPHROLITHOTOMY Right 06/22/2016   Procedure: NEPHROLITHOTOMY PERCUTANEOUS;  Surgeon: Hollice Espy, MD;  Location: [facility];  Service: Urology;  Laterality: Right;   SPLENECTOMY, TOTAL  1982   URETEROSCOPY Right 06/22/2016   Procedure: ANTEGRADE URETEROSCOPY;  Surgeon: Hollice Espy, MD;  Location: [facility];  Service: Urology;  Laterality: Right;    Home Medications:  Allergies as of 09/19/2020       Reactions   Dulaglutide Rash   Shellfish Allergy Anaphylaxis   Penicillins Nausea And Vomiting   Has patient had a PCN reaction causing immediate rash, facial/tongue/throat swelling, SOB or lightheadedness with hypotension: yes rash Has patient had a PCN reaction causing severe rash involving mucus membranes or skin necrosis:no Has patient had a PCN reaction that required hospitalization no Has patient had a PCN reaction occurring within the last 10 years: yes If all of the above answers are "NO", then may proceed with Cephalosporin use.        Medication List        Accurate as of September 19, 2020 12:08 PM. If you have any questions, ask your nurse or doctor.          2-3CC SYRINGE 3 ML Misc 1 mg by Does not apply route every 14 (fourteen) days.   aspirin EC 81 MG tablet Take 81 mg by mouth at bedtime.   Basaglar KwikPen 100 UNIT/ML Inject 20 Units into the skin at bedtime.   BD Disp Needles 18G X 1-1/2"  Misc Generic drug: NEEDLE (DISP) 18 G 1 mg by Does not apply route every 14 (fourteen) days.   BD Disp Needles 21G X 1-1/2" Misc Generic drug: NEEDLE (DISP) 21 G USE 1  EVERY TWO WEEKS   Biktarvy 50-200-25 MG Tabs tablet Generic drug: bictegravir-emtricitabine-tenofovir AF TAKE 1 TABLET BY MOUTH ONCE DAILY IN PLACE OF ATRIPLA   CENTRUM ULTRA MENS PO Take 1 tablet by mouth daily.   diltiazem 240 MG 24 hr capsule Commonly known as: CARDIZEM CD  Take 240 mg by mouth daily.   diphenhydrAMINE 50 MG tablet Commonly known as: BENADRYL Take one hour prior to the CT scan   efavirenz-emtricitabine-tenofovir 600-200-300 MG tablet Commonly known as: ATRIPLA Take 1 tablet by mouth at bedtime.   EPINEPHrine 0.3 mg/0.3 mL Soaj injection Commonly known as: EPI-PEN SMARTSIG:1 Pre-Filled Pen Syringe IM PRN   FreeStyle Libre 14 Day Sensor Misc SMARTSIG:1 Kit(s) Topical Every 2 Weeks   glipiZIDE 10 MG 24 hr tablet Commonly known as: GLUCOTROL XL Take 5-10 mg by mouth 2 (two) times daily. 10 mg in the morning & 5 mg in the evening   HumuLIN R 100 units/mL injection Generic drug: insulin regular SLIDING SCALE INSULIN 151 200 2 UNITS 201 250 4 UNITS 251 300 6 UNITS 301 350 8 UNITS 351 400 10 UNITS IF OVER 400 CALL MD   hydrochlorothiazide 25 MG tablet Commonly known as: HYDRODIURIL Take 1 tablet by mouth daily.   Insulin NPH (Human) (Isophane) 100 UNIT/ML Kiwkpen Commonly known as: HUMULIN N Inject into the skin.   HumuLIN N KwikPen 100 UNIT/ML Kiwkpen Generic drug: Insulin NPH (Human) (Isophane) Inject into the skin.   latanoprost 0.005 % ophthalmic solution Commonly known as: XALATAN   Levemir FlexTouch 100 UNIT/ML FlexPen Generic drug: insulin detemir Inject 30 Units into the skin at bedtime.   lisinopril 40 MG tablet Commonly known as: ZESTRIL Take 40 mg by mouth daily.   metFORMIN 500 MG tablet Commonly known as: GLUCOPHAGE Take 1,000 mg by mouth 2 (two) times daily  with a meal.   omeprazole 20 MG tablet Commonly known as: PRILOSEC OTC Take by mouth.   phentermine 15 MG capsule Take by mouth.   pravastatin 20 MG tablet Commonly known as: PRAVACHOL Take 20 mg by mouth every evening.   predniSONE 50 MG tablet Commonly known as: DELTASONE Take the one tablet 13 hours, 7 hours and 1 hours prior to the CT Urogram   rosuvastatin 10 MG tablet Commonly known as: CRESTOR Take by mouth.   tadalafil 5 MG tablet Commonly known as: CIALIS Take 1 tablet (5 mg total) by mouth daily.   testosterone cypionate 200 MG/ML injection Commonly known as: DEPOTESTOSTERONE CYPIONATE INJECT 1 ML INTO THE MUSCLE EVERY 14 DAYS        Allergies:  Allergies  Allergen Reactions   Dulaglutide Rash   Shellfish Allergy Anaphylaxis   Penicillins Nausea And Vomiting    Has patient had a PCN reaction causing immediate rash, facial/tongue/throat swelling, SOB or lightheadedness with hypotension: yes rash Has patient had a PCN reaction causing severe rash involving mucus membranes or skin necrosis:no Has patient had a PCN reaction that required hospitalization no Has patient had a PCN reaction occurring within the last 10 years: yes If all of the above answers are "NO", then may proceed with Cephalosporin use.     Family History: Family History  Problem Relation Age of Onset   Breast cancer Mother    Kidney cancer Neg Hx    Kidney disease Neg Hx    Prostate cancer Neg Hx     Social History:  reports that he quit smoking about 5 years ago. His smoking use included cigarettes. He has never used smokeless tobacco. He reports that he does not drink alcohol and does not use drugs.  ROS: Pertinent ROS in HPI  Physical Exam: BP (!) 148/84   Pulse 80   Ht _0  (1.702 m)   Wt 290 lb (131.5 kg)   BMI 45.42  kg/m   Constitutional:  Well nourished. Alert and oriented, No acute distress. HEENT: [place] AT, mask in place.  Trachea midline Cardiovascular: No clubbing,  cyanosis, or edema. Respiratory: Normal respiratory effort, no increased work of breathing. GU: No CVA tenderness.  No bladder fullness or masses.  Patient with uncircumcised phallus. Foreskin easily retracted  Urethral meatus is patent.  No penile discharge. No penile lesions or rashes. Scrotum without lesions, cysts, rashes and/or edema.  Testicles are located scrotally bilaterally. No masses are appreciated in the testicles. Left and right epididymis are normal. Rectal: Patient with  normal sphincter tone. Anus and perineum without scarring or rashes. No rectal masses are appreciated. Prostate is approximately 45 grams, could only palpate the apex, no nodules are appreciated. Seminal vesicles could not be palpated  Neurologic: Grossly intact, no focal deficits, moving all 4 extremities. Psychiatric: Normal mood and affect.   Laboratory Data: WBC (White Blood Cell Count) 3.2 - 9.8 x10^9/L 7.7   Hemoglobin 13.7 - 17.3 g/dL 15.9   Hematocrit 39.0 - 49.0 % 46.1   Platelets 150 - 450 x10^9/L 220   MCV (Mean Corpuscular Volume) 80 - 98 fL 75 Low    MCH (Mean Corpuscular Hemoglobin) 26.5 - 34.0 pg 25.9 Low    MCHC (Mean Corpuscular Hemoglobin Concentration) 31.5 - 36.3 % 34.5   RBC (Red Blood Cell Count) 4.37 - 5.74 x10^12/L 6.15 High    RDW-CV (Red Cell Distribution Width) 11.5 - 14.5 % 15.9 High    MPV (Mean Platelet Volume) 7.2 - 11.7 fL 10.9   Resulting Agency  [facility]  Specimen Collected: 06/04/20 09:59 Last Resulted: 06/04/20 11:05  Received From: [place]  Result Received: 06/24/20 12:03   Sodium 135 - 145 mmol/L 135   Potassium 3.5 - 5.0 mmol/L 4.0   Chloride 98 - 108 mmol/L 101   Carbon Dioxide (CO2) 21 - 30 mmol/L 25   Urea Nitrogen (BUN) 7 - 20 mg/dL 11   Creatinine 0.6 - 1.3 mg/dL 1.1   Glucose 70 - 140 mg/dL 169 High    Comment: Interpretive Data:  Above is the NONFASTING reference range.   Below are the FASTING reference ranges:   NORMAL:      70-99 mg/dL  PREDIABETES: 100-125 mg/dL  DIABETES:    > 125 mg/dL   Calcium 8.7 - 10.2 mg/dL 9.6   AST (Aspartate Aminotransferase) 15 - 41 U/L 24   ALT (Alanine Aminotransferase) 17 - 63 U/L 28   Bilirubin, Total 0.4 - 1.5 mg/dL 0.6   Alk Phos (Alkaline Phosphatase) 24 - 110 U/L 45   Albumin 3.5 - 4.8 g/dL 4.1   Protein, Total 6.2 - 8.1 g/dL 7.0   Anion Gap 3 - 12 mmol/L 9   BUN/CREA Ratio 6 - 27 10   Glomerular Filtration Rate (eGFR)  mL/min/1.73sq m 85   Comment: CKD-EPI (2021) does not include patient's race in the calculation of eGFR. Monitoring changes of plasma creatinine and eGFR over time is useful for monitoring kidney function.  This change was made on 04/23/2020.   Interpretive Ranges for eGFR(CKD-EPI 2021):   eGFR:              > 60 mL/min/1.73 sq m - Normal  eGFR:              30 - 59 mL/min/1.73 sq m - Moderately Decreased  eGFR:              15 -  29 mL/min/1.73 sq m - Severely Decreased  eGFR:              < 15 mL/min/1.73 sq m -  Kidney Failure    Note: These eGFR calculations do not apply in acute situations  when eGFR is changing rapidly or in patients on dialysis.   Resulting Agency  [facility]  Specimen Collected: 06/04/20 09:59 Last Resulted: 06/04/20 16:19  Received From: [place]  Result Received: 06/24/20 12:03  I have reviewed the labs.   Pertinent Imaging: No imaging since last visit  Assessment & Plan:   1. Testosterone deficiency -Testosterone pending - continue testosterone cypionate 200 mg/milliliters, 1 cc every 14 days - injection given today  2. Erectile dysfunction -continue Cialis 5 mg daily  3. BPH with LUTS -no issues at this time  Return in about 6 months (around 03/22/2021) for PSA, testosterone (one week after injection) H & H, IPSS, SHIM and exam.  These notes generated with voice recognition software. I apologize for typographical errors.  Zara Council,  PA-C  [facility] [address]  [place], [place] [address] [phone]

## 2020-09-19 ENCOUNTER — Other Ambulatory Visit: Payer: Self-pay

## 2020-09-19 ENCOUNTER — Encounter: Payer: Self-pay | Admitting: Urology

## 2020-09-19 ENCOUNTER — Ambulatory Visit (INDEPENDENT_AMBULATORY_CARE_PROVIDER_SITE_OTHER): Payer: Managed Care, Other (non HMO) | Admitting: Urology

## 2020-09-19 VITALS — BP 148/84 | HR 80 | Ht 67.0 in | Wt 290.0 lb

## 2020-09-19 DIAGNOSIS — N138 Other obstructive and reflux uropathy: Secondary | ICD-10-CM | POA: Diagnosis not present

## 2020-09-19 DIAGNOSIS — N401 Enlarged prostate with lower urinary tract symptoms: Secondary | ICD-10-CM | POA: Diagnosis not present

## 2020-09-19 DIAGNOSIS — E349 Endocrine disorder, unspecified: Secondary | ICD-10-CM

## 2020-09-19 DIAGNOSIS — N529 Male erectile dysfunction, unspecified: Secondary | ICD-10-CM

## 2020-09-19 MED ORDER — TESTOSTERONE CYPIONATE 200 MG/ML IM SOLN
200.0000 mg | Freq: Once | INTRAMUSCULAR | Status: AC
  Administered 2020-09-19: 200 mg via INTRAMUSCULAR

## 2020-09-19 NOTE — Addendum Note (Signed)
Addended by: Martha Clan on: 09/19/2020 12:22 PM   Modules accepted: Orders

## 2020-09-19 NOTE — Progress Notes (Signed)
Testosterone IM Injection  Due to Hypogonadism patient is present today for a Testosterone Injection.  Medication: Testosterone Cypionate Dose: 200mg /4ml Location: right upper outer buttocks Lot: [id number] Exp:12/23  Patient tolerated well, no complications were noted  Preformed by: 0m, CMA

## 2020-09-20 LAB — PSA: Prostate Specific Ag, Serum: 0.2 ng/mL (ref 0.0–4.0)

## 2020-09-20 LAB — TESTOSTERONE: Testosterone: 443 ng/dL (ref 264–916)

## 2020-09-20 LAB — HEMOGLOBIN AND HEMATOCRIT, BLOOD
Hematocrit: 45.7 % (ref 37.5–51.0)
Hemoglobin: 14.9 g/dL (ref 13.0–17.7)

## 2020-09-29 ENCOUNTER — Other Ambulatory Visit: Payer: Self-pay | Admitting: Urology

## 2020-10-01 ENCOUNTER — Other Ambulatory Visit: Payer: Self-pay | Admitting: Surgery

## 2020-10-09 ENCOUNTER — Other Ambulatory Visit: Payer: Self-pay

## 2020-10-09 ENCOUNTER — Encounter
Admission: RE | Admit: 2020-10-09 | Discharge: 2020-10-09 | Disposition: A | Discharge status: Home / Self Care | Payer: Managed Care, Other (non HMO) | Source: Ambulatory Visit | Attending: Surgery | Admitting: Surgery

## 2020-10-09 HISTORY — DX: Hyperlipidemia, unspecified: E78.5

## 2020-10-09 NOTE — Patient Instructions (Addendum)
Your procedure is scheduled on: October 16, 2020 [facility] Report to the Registration Desk on the 1st floor of the Medical Mall. To find out your arrival time, please call [phone] between 1PM - 3PM on: October 15, 2020 TUESDAY  REMEMBER: Instructions that are not followed completely may result in serious medical risk, up to and including death; or upon the discretion of your surgeon and anesthesiologist your surgery may need to be rescheduled.  Do not eat food after midnight the night before surgery.  No gum chewing, lozengers or hard candies.  You may however, drink CLEAR liquids up to 2 hours before you are scheduled to arrive for your surgery. Do not drink anything within 2 hours of your scheduled arrival time.  Clear liquids include: - water   Do NOT drink anything that is not on this list.  Type 1 and Type 2 diabetics should only drink water.  TAKE THESE MEDICATIONS THE MORNING OF SURGERY WITH A SIP OF WATER: DILTIAZEM   Stop Metformin 2 days prior to surgery. LAST DOSE OF METFORMIN October 13, 2020 SUNDAY  Take 1/2 of usual insulin dose the night before surgery  One week prior to surgery: Stop Anti-inflammatories (NSAIDS) such as Advil, Aleve, Ibuprofen, Motrin, Naproxen, Naprosyn and ASPIRIN OR Aspirin based products such as Excedrin, Goodys Powder, BC Powder. Stop ANY OVER THE COUNTER supplements until after surgery. You may however, continue to take Tylenol if needed for pain up until the day of surgery.  No Alcohol for 24 hours before or after surgery.  No Smoking including e-cigarettes for 24 hours prior to surgery.  No chewable tobacco products for at least 6 hours prior to surgery.  No nicotine patches on the day of surgery.  Do not use any "recreational" drugs for at least a week prior to your surgery.  Please be advised that the combination of cocaine and anesthesia may have negative outcomes, up to and including death. If you test positive for cocaine, your  surgery will be cancelled.  On the morning of surgery brush your teeth with toothpaste and water, you may rinse your mouth with mouthwash if you wish. Do not swallow any toothpaste or mouthwash.  Do not wear jewelry, make-up, hairpins, clips or nail polish.  Do not wear lotions, powders, or perfumes OR DEODORANT   Do not shave body from the neck down 48 hours prior to surgery just in case you cut yourself which could leave a site for infection.  Also, freshly shaved skin may become irritated if using the CHG soap.  Contact lenses, hearing aids and dentures may not be worn into surgery.  Do not bring valuables to the hospital. [facility] is not responsible for any missing/lost belongings or valuables.   Use CHG Soap as directed on instruction sheet.  Notify your doctor if there is any change in your medical condition (cold, fever, infection).  Wear comfortable clothing (specific to your surgery type) to the hospital.  After surgery, you can help prevent lung complications by doing breathing exercises.  Take deep breaths and cough every 1-2 hours. Your doctor may order a device called an Incentive Spirometer to help you take deep breaths. When coughing or sneezing, hold a pillow firmly against your incision with both hands. This is called "splinting." Doing this helps protect your incision. It also decreases belly discomfort.  If you are being discharged the day of surgery, you will not be allowed to drive home. You will need a responsible adult (18 years  or older) to drive you home and stay with you that night.   If you are taking public transportation, you will need to have a responsible adult (18 years or older) with you. Please confirm with your physician that it is acceptable to use public transportation.   Please call the Pre-admissions Testing Dept. at [phone] if you have any questions about these instructions.  Surgery Visitation Policy:  Patients undergoing a  surgery or procedure may have one family member or support person with them as long as that person is not COVID-19 positive or experiencing its symptoms.  That person may remain in the waiting area during the procedure.

## 2020-10-09 NOTE — Progress Notes (Signed)
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   Received PCN/cephalosporin with no documented complications CEFTRIAXONE received on 04/03/2019  Screened as appropriate for cephalosporin use during medication reconciliation No immediate angioedema, dysphagia, SOB, anaphylaxis symptoms. No severe rash involving mucous membranes or skin necrosis. No hospital admissions related to side effects of PCN/cephalosporin use.  No documented reaction to PCN or cephalosporin in the last 10 years.  Request:  As an evidence based approach to reducing the rate of incidence for post-operative SSI and the development of MDROs, could an agent with narrower coverage for preoperative prophylaxis in this patient's upcoming surgical course be considered?   Currently ordered preoperative prophylactic ABX: clindamycin.   Specifically requesting change to cephalosporin (CEFAZOLIN).   Please communicate decision with me and I will change the orders in Epic as per your direction.   Things to consider: Many patients report that they were "allergic" to PCN earlier in life, however this does not translate into a true lifelong allergy. Patients can lose sensitivity to specific IgE antibodies over time if PCN is avoided  (Kleris & Lugar, 2019).  Up to 10% of the adult population and 15% of hospitalized patients report an allergy to PCN, however clinical studies suggest that 90% of those reporting an allergy can tolerate PCN antibiotics (Kleris & Lugar, 2019).  Cross-sensitivity between PCN and cephalosporins has been documented as being as high as 10%, however this estimation included data believed to have been collected in a setting where there was contamination. Newer data suggests that the prevalence of cross-sensitivity between PCN and cephalosporins is actually estimated to be closer to 1% (Hermanides et al., 2018).   Patients labeled as PCN allergic, whether they are truly allergic or not, have been found to have inferior outcomes in terms of rates of serious infection, and these patients tend to have longer hospital stays [facility] & Lugar, 2019).  Treatment related secondary infections, such as Clostridioides difficile, have been linked to the improper use of broad spectrum antibiotics in patients improperly labeled as PCN allergic (Kleris & Lugar, 2019).  Anaphylaxis from cephalosporins is rare and the evidence suggests that there is no increased risk of an anaphylactic type reaction when cephalosporins are used in a PCN allergic patient (Pichichero, 2006).  Citations: Hermanides J, Lemkes BA, Prins Gwenyth Bender MW, Terreehorst I. Presumed ?-Lactam Allergy and Cross-reactivity in the Operating Theater: A Practical Approach. Anesthesiology. 2018 Aug;129(2):335-342. doi: 10.1097/ALN.0000000000002252. PMID: 86761950.  Kleris, R. S., & Lugar, P. L. (2019). Things We Do For No Reason: Failing to Question a Penicillin Allergy History. Journal of hospital medicine, 14(10), [phone]. Advance online publication. airportbarriers.com  Pichichero, M. E. (2006). Cephalosporins can be prescribed safely for penicillin-allergic patients. Journal of family medicine, 55(2), 106-112. Accessed:  https://cdn.mdedge.com/files/s38fs-public/Document/September-2017/5502JFP_AppliedEvidence1.pdf   Quentin Mulling, MSN, APRN, FNP-C, CEN [facility]  Peri-operative Services Nurse Practitioner FAX: [phone] 10/09/20 11:08 AM

## 2020-10-10 ENCOUNTER — Encounter: Payer: Self-pay | Admitting: Urgent Care

## 2020-10-10 ENCOUNTER — Encounter
Admission: RE | Admit: 2020-10-10 | Discharge: 2020-10-10 | Disposition: A | Discharge status: Home / Self Care | Payer: Managed Care, Other (non HMO) | Source: Ambulatory Visit | Attending: Surgery | Admitting: Surgery

## 2020-10-10 DIAGNOSIS — Z01818 Encounter for other preprocedural examination: Secondary | ICD-10-CM | POA: Diagnosis not present

## 2020-10-10 LAB — CBC
HCT: 45.8 % (ref 39.0–52.0)
Hemoglobin: 15.9 g/dL (ref 13.0–17.0)
MCH: 28 pg (ref 26.0–34.0)
MCHC: 34.7 g/dL (ref 30.0–36.0)
MCV: 80.8 fL (ref 80.0–100.0)
Platelets: 201 10*3/uL (ref 150–400)
RBC: 5.67 MIL/uL (ref 4.22–5.81)
RDW: 14.2 % (ref 11.5–15.5)
WBC: 6.5 10*3/uL (ref 4.0–10.5)
nRBC: 0 % (ref 0.0–0.2)

## 2020-10-10 LAB — BASIC METABOLIC PANEL
Anion gap: 12 (ref 5–15)
BUN: 14 mg/dL (ref 6–20)
CO2: 25 mmol/L (ref 22–32)
Calcium: 9.1 mg/dL (ref 8.9–10.3)
Chloride: 98 mmol/L (ref 98–111)
Creatinine, Ser: 0.92 mg/dL (ref 0.61–1.24)
GFR, Estimated: >60 mL/min (ref >60)
Glucose, Bld: 227 mg/dL — ABNORMAL HIGH (ref 70–99)
Potassium: 3.7 mmol/L (ref 3.5–5.1)
Sodium: 135 mmol/L (ref 135–145)

## 2020-10-10 NOTE — Progress Notes (Signed)
  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 Following analysis of the risk versus benefits, Dr. Joice Lofts, Excell Seltzer, MD advising that it would be acceptable to discontinue the ordered clindamycin and place an order for cefazolin 2 gm IV on call to the OR. Orders for this patient were amended by me following collaborative conversation with attending surgeon.  Quentin Mulling, MSN, APRN, FNP-C, CEN [facility]  Peri-operative Services Nurse Practitioner Phone: [phone] 10/10/20 9:56 AM

## 2020-10-16 ENCOUNTER — Encounter: Payer: Self-pay | Admitting: Surgery

## 2020-10-16 ENCOUNTER — Other Ambulatory Visit: Payer: Self-pay

## 2020-10-16 ENCOUNTER — Encounter
Admission: RE | Disposition: A | Discharge status: Home / Self Care | Payer: Self-pay | Source: Home / Self Care | Attending: Surgery

## 2020-10-16 ENCOUNTER — Ambulatory Visit: Payer: Managed Care, Other (non HMO) | Admitting: Urgent Care

## 2020-10-16 ENCOUNTER — Ambulatory Visit
Admission: RE | Admit: 2020-10-16 | Discharge: 2020-10-16 | Disposition: A | Discharge status: Home / Self Care | Payer: Managed Care, Other (non HMO) | Attending: Surgery | Admitting: Surgery

## 2020-10-16 DIAGNOSIS — Z7984 Long term (current) use of oral hypoglycemic drugs: Secondary | ICD-10-CM | POA: Diagnosis not present

## 2020-10-16 DIAGNOSIS — Z79899 Other long term (current) drug therapy: Secondary | ICD-10-CM | POA: Insufficient documentation

## 2020-10-16 DIAGNOSIS — Z79890 Hormone replacement therapy (postmenopausal): Secondary | ICD-10-CM | POA: Diagnosis not present

## 2020-10-16 DIAGNOSIS — Z7982 Long term (current) use of aspirin: Secondary | ICD-10-CM | POA: Diagnosis not present

## 2020-10-16 DIAGNOSIS — Z888 Allergy status to other drugs, medicaments and biological substances status: Secondary | ICD-10-CM | POA: Insufficient documentation

## 2020-10-16 DIAGNOSIS — Z87891 Personal history of nicotine dependence: Secondary | ICD-10-CM | POA: Diagnosis not present

## 2020-10-16 DIAGNOSIS — Z7952 Long term (current) use of systemic steroids: Secondary | ICD-10-CM | POA: Insufficient documentation

## 2020-10-16 DIAGNOSIS — G5602 Carpal tunnel syndrome, left upper limb: Secondary | ICD-10-CM | POA: Insufficient documentation

## 2020-10-16 DIAGNOSIS — Z794 Long term (current) use of insulin: Secondary | ICD-10-CM | POA: Diagnosis not present

## 2020-10-16 DIAGNOSIS — Z91013 Allergy to seafood: Secondary | ICD-10-CM | POA: Diagnosis not present

## 2020-10-16 DIAGNOSIS — M67432 Ganglion, left wrist: Secondary | ICD-10-CM | POA: Insufficient documentation

## 2020-10-16 HISTORY — PX: GANGLION CYST EXCISION: SHX1691

## 2020-10-16 LAB — GLUCOSE, CAPILLARY
Glucose-Capillary: 148 mg/dL — ABNORMAL HIGH (ref 70–99)
Glucose-Capillary: 112 mg/dL — ABNORMAL HIGH (ref 70–99)

## 2020-10-16 SURGERY — EXCISION, GANGLION CYST, WRIST
Anesthesia: General | Site: Wrist | Laterality: Left

## 2020-10-16 MED ORDER — FAMOTIDINE 20 MG PO TABS
ORAL_TABLET | ORAL | Status: AC
  Administered 2020-10-16: 20 mg via ORAL
  Filled 2020-10-16: qty 1

## 2020-10-16 MED ORDER — CEFAZOLIN SODIUM-DEXTROSE 2-4 GM/100ML-% IV SOLN
INTRAVENOUS | Status: AC
  Filled 2020-10-16: qty 100

## 2020-10-16 MED ORDER — CEFAZOLIN SODIUM-DEXTROSE 2-4 GM/100ML-% IV SOLN: 2.0000 g | Freq: Once | INTRAVENOUS | Status: DC

## 2020-10-16 MED ORDER — LIDOCAINE HCL (CARDIAC) PF 100 MG/5ML IV SOSY
PREFILLED_SYRINGE | INTRAVENOUS | Status: DC | PRN
  Administered 2020-10-16: 100 mg via INTRAVENOUS

## 2020-10-16 MED ORDER — CHLORHEXIDINE GLUCONATE 0.12 % MT SOLN
OROMUCOSAL | Status: AC
  Administered 2020-10-16: 15 mL via OROMUCOSAL
  Filled 2020-10-16: qty 15

## 2020-10-16 MED ORDER — GLYCOPYRROLATE 0.2 MG/ML IJ SOLN
INTRAMUSCULAR | Status: AC
  Filled 2020-10-16: qty 1

## 2020-10-16 MED ORDER — PROPOFOL 10 MG/ML IV BOLUS
INTRAVENOUS | Status: DC | PRN
  Administered 2020-10-16: 240 mg via INTRAVENOUS
  Administered 2020-10-16: 20 mg via INTRAVENOUS
  Administered 2020-10-16: 40 mg via INTRAVENOUS

## 2020-10-16 MED ORDER — FAMOTIDINE 20 MG PO TABS: 20.0000 mg | ORAL_TABLET | Freq: Once | ORAL | Status: AC

## 2020-10-16 MED ORDER — LIDOCAINE HCL (PF) 1 % IJ SOLN
INTRAMUSCULAR | Status: AC
  Filled 2020-10-16: qty 30

## 2020-10-16 MED ORDER — CHLORHEXIDINE GLUCONATE 0.12 % MT SOLN: 15.0000 mL | Freq: Once | OROMUCOSAL | Status: AC

## 2020-10-16 MED ORDER — FENTANYL CITRATE (PF) 100 MCG/2ML IJ SOLN
INTRAMUSCULAR | Status: AC
  Filled 2020-10-16: qty 2

## 2020-10-16 MED ORDER — HYDROCODONE-ACETAMINOPHEN 5-325 MG PO TABS: 1.0000 | ORAL_TABLET | Freq: Four times a day (QID) | ORAL | 0 refills | Status: AC | PRN

## 2020-10-16 MED ORDER — ONDANSETRON HCL 4 MG/2ML IJ SOLN
INTRAMUSCULAR | Status: AC
  Filled 2020-10-16: qty 2

## 2020-10-16 MED ORDER — DEXAMETHASONE SODIUM PHOSPHATE 10 MG/ML IJ SOLN
INTRAMUSCULAR | Status: DC | PRN
  Administered 2020-10-16: 5 mg via INTRAVENOUS

## 2020-10-16 MED ORDER — PROMETHAZINE HCL 25 MG/ML IJ SOLN: 6.2500 mg | INTRAMUSCULAR | Status: DC | PRN

## 2020-10-16 MED ORDER — ROCURONIUM BROMIDE 10 MG/ML (PF) SYRINGE
PREFILLED_SYRINGE | INTRAVENOUS | Status: AC
  Filled 2020-10-16: qty 10

## 2020-10-16 MED ORDER — HYDROCODONE-ACETAMINOPHEN 5-325 MG PO TABS: 1.0000 | ORAL_TABLET | Freq: Four times a day (QID) | ORAL | 0 refills | Status: DC | PRN

## 2020-10-16 MED ORDER — DEXMEDETOMIDINE (PRECEDEX) IN NS 20 MCG/5ML (4 MCG/ML) IV SYRINGE
PREFILLED_SYRINGE | INTRAVENOUS | Status: AC
  Filled 2020-10-16: qty 5

## 2020-10-16 MED ORDER — SUCCINYLCHOLINE CHLORIDE 200 MG/10ML IV SOSY
PREFILLED_SYRINGE | INTRAVENOUS | Status: AC
  Filled 2020-10-16: qty 10

## 2020-10-16 MED ORDER — MIDAZOLAM HCL 2 MG/2ML IJ SOLN
INTRAMUSCULAR | Status: DC | PRN
  Administered 2020-10-16: 2 mg via INTRAVENOUS

## 2020-10-16 MED ORDER — GLYCOPYRROLATE 0.2 MG/ML IJ SOLN
INTRAMUSCULAR | Status: DC | PRN
  Administered 2020-10-16: .2 mg via INTRAVENOUS

## 2020-10-16 MED ORDER — SODIUM CHLORIDE 0.9 % IV SOLN: INTRAVENOUS | Status: DC

## 2020-10-16 MED ORDER — ROCURONIUM BROMIDE 100 MG/10ML IV SOLN
INTRAVENOUS | Status: DC | PRN
  Administered 2020-10-16: 10 mg via INTRAVENOUS
  Administered 2020-10-16: 20 mg via INTRAVENOUS

## 2020-10-16 MED ORDER — DEXMEDETOMIDINE (PRECEDEX) IN NS 20 MCG/5ML (4 MCG/ML) IV SYRINGE
PREFILLED_SYRINGE | INTRAVENOUS | Status: DC | PRN
  Administered 2020-10-16: 8 ug via INTRAVENOUS
  Administered 2020-10-16: 4 ug via INTRAVENOUS
  Administered 2020-10-16: 8 ug via INTRAVENOUS

## 2020-10-16 MED ORDER — MIDAZOLAM HCL 2 MG/2ML IJ SOLN
INTRAMUSCULAR | Status: AC
  Filled 2020-10-16: qty 2

## 2020-10-16 MED ORDER — DEXAMETHASONE SODIUM PHOSPHATE 10 MG/ML IJ SOLN
INTRAMUSCULAR | Status: AC
  Filled 2020-10-16: qty 1

## 2020-10-16 MED ORDER — SUGAMMADEX SODIUM 500 MG/5ML IV SOLN
INTRAVENOUS | Status: DC | PRN
Start: 2020-10-16 — End: 2020-10-16
  Administered 2020-10-16: 275 mg via INTRAVENOUS

## 2020-10-16 MED ORDER — SUCCINYLCHOLINE CHLORIDE 200 MG/10ML IV SOSY
PREFILLED_SYRINGE | INTRAVENOUS | Status: DC | PRN
  Administered 2020-10-16: 140 mg via INTRAVENOUS

## 2020-10-16 MED ORDER — ORAL CARE MOUTH RINSE: 15.0000 mL | Freq: Once | OROMUCOSAL | Status: AC

## 2020-10-16 MED ORDER — BUPIVACAINE HCL (PF) 0.5 % IJ SOLN
INTRAMUSCULAR | Status: DC | PRN
  Administered 2020-10-16: 10 mL

## 2020-10-16 MED ORDER — LIDOCAINE HCL (PF) 2 % IJ SOLN
INTRAMUSCULAR | Status: AC
  Filled 2020-10-16: qty 5

## 2020-10-16 MED ORDER — ONDANSETRON HCL 4 MG/2ML IJ SOLN
INTRAMUSCULAR | Status: DC | PRN
  Administered 2020-10-16: 4 mg via INTRAVENOUS

## 2020-10-16 MED ORDER — FENTANYL CITRATE (PF) 100 MCG/2ML IJ SOLN
INTRAMUSCULAR | Status: DC | PRN
  Administered 2020-10-16 (×2): 50 ug via INTRAVENOUS

## 2020-10-16 MED ORDER — FENTANYL CITRATE (PF) 100 MCG/2ML IJ SOLN: 25.0000 ug | INTRAMUSCULAR | Status: DC | PRN

## 2020-10-16 MED ORDER — 0.9 % SODIUM CHLORIDE (POUR BTL) OPTIME
TOPICAL | Status: DC | PRN
  Administered 2020-10-16: 500 mL

## 2020-10-16 SURGICAL SUPPLY — 31 items
APL PRP STRL LF DISP 70% ISPRP (MISCELLANEOUS) ×2
BNDG COHESIVE 4X5 TAN ST LF (GAUZE/BANDAGES/DRESSINGS) ×2 IMPLANT
BNDG ELASTIC 2X5.8 VLCR STR LF (GAUZE/BANDAGES/DRESSINGS) ×2 IMPLANT
BNDG ESMARK 4X12 TAN STRL LF (GAUZE/BANDAGES/DRESSINGS) ×2 IMPLANT
CANISTER SUCT 1200ML W/VALVE (MISCELLANEOUS) ×1 IMPLANT
CHLORAPREP W/TINT 26 (MISCELLANEOUS) ×4 IMPLANT
CORD BIP STRL DISP 12FT (MISCELLANEOUS) ×2 IMPLANT
CUFF TOURN SGL QUICK 18X4 (TOURNIQUET CUFF) IMPLANT
FORCEPS JEWEL BIP 4-3/4 STR (INSTRUMENTS) ×2 IMPLANT
GAUZE 4X4 16PLY ~~LOC~~+RFID DBL (SPONGE) ×2 IMPLANT
GAUZE SPONGE 4X4 12PLY STRL (GAUZE/BANDAGES/DRESSINGS) ×2 IMPLANT
GAUZE XEROFORM 1X8 LF (GAUZE/BANDAGES/DRESSINGS) ×2 IMPLANT
GLOVE SURG ENC MOIS LTX SZ8 (GLOVE) ×2 IMPLANT
GLOVE SURG UNDER LTX SZ8 (GLOVE) ×2 IMPLANT
GOWN STRL REUS W/ TWL XL LVL3 (GOWN DISPOSABLE) ×1 IMPLANT
GOWN STRL REUS W/TWL XL LVL3 (GOWN DISPOSABLE) ×2
KIT TURNOVER KIT A (KITS) ×2 IMPLANT
MANIFOLD NEPTUNE II (INSTRUMENTS) ×2 IMPLANT
NS IRRIG 500ML POUR BTL (IV SOLUTION) ×2 IMPLANT
PACK EXTREMITY ARMC (MISCELLANEOUS) ×2 IMPLANT
SPLINT WRIST LG LT TX990309 (SOFTGOODS) ×1 IMPLANT
SPLINT WRIST LG RT TX900304 (SOFTGOODS) IMPLANT
SPLINT WRIST M LT TX990308 (SOFTGOODS) IMPLANT
SPLINT WRIST M RT TX990303 (SOFTGOODS) IMPLANT
STOCKINETTE IMPERVIOUS 9X36 MD (GAUZE/BANDAGES/DRESSINGS) ×2 IMPLANT
STRIP CLOSURE SKIN 1/4X4 (GAUZE/BANDAGES/DRESSINGS) ×1 IMPLANT
SUT PROLENE 4 0 PS 2 18 (SUTURE) ×2 IMPLANT
SUT VIC AB 3-0 SH 27 (SUTURE) ×2
SUT VIC AB 3-0 SH 27X BRD (SUTURE) IMPLANT
SWABSTK COMLB BENZOIN TINCTURE (MISCELLANEOUS) ×1 IMPLANT
WATER STERILE IRR 500ML POUR (IV SOLUTION) ×2 IMPLANT
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
## 2020-10-16 NOTE — Anesthesia Preprocedure Evaluation (Signed)
Anesthesia Evaluation  Patient identified by MRN, date of birth, ID band Patient awake    Reviewed: Allergy & Precautions, H&P , NPO status , Patient's Chart, lab work & pertinent test results, reviewed documented beta blocker date and time   History of Anesthesia Complications Negative for: history of anesthetic complications  Airway Mallampati: III  TM Distance: >3 FB Neck ROM: full    Dental  (+) Caps, Teeth Intact, Dental Advidsory Given Permanent bridge x 2:   Pulmonary neg shortness of breath, neg COPD, neg recent URI, Current Smoker, former smoker,  Currently on non-rebreather, sating 97%          Cardiovascular Exercise Tolerance: Good hypertension, (-) angina(-) CAD, (-) Past MI, (-) Cardiac Stents and (-) CABG (-) dysrhythmias (-) Valvular Problems/Murmurs     Neuro/Psych negative neurological ROS  negative psych ROS   GI/Hepatic negative GI ROS, Neg liver ROS,   Endo/Other  diabetesMorbid obesity  Renal/GU Renal disease (kidney stones)  negative genitourinary   Musculoskeletal   Abdominal   Peds  Hematology  (+) HIV,   Anesthesia Other Findings Past Medical History: 05/08/2016: Asplenia No date: Diabetes mellitus without complication (HCC) No date: HIV (human immunodeficiency virus infection) (* No date: Hypertension   Reproductive/Obstetrics negative OB ROS                             Anesthesia Physical  Anesthesia Plan  ASA: 3  Anesthesia Plan: General   Post-op Pain Management:    Induction: Intravenous  PONV Risk Score and Plan: 2 and Ondansetron, Dexamethasone, Midazolam, Promethazine and Treatment may vary due to age or medical condition  Airway Management Planned: Oral ETT  Additional Equipment:   Intra-op Plan:   Post-operative Plan: Extubation in OR  Informed Consent: I have reviewed the patients History and Physical, chart, labs and discussed the  procedure including the risks, benefits and alternatives for the proposed anesthesia with the patient or authorized representative who has indicated his/her understanding and acceptance.     Dental Advisory Given  Plan Discussed with: Anesthesiologist, CRNA and Surgeon  Anesthesia Plan Comments:         Anesthesia Quick Evaluation

## 2020-10-16 NOTE — Discharge Instructions (Addendum)
Orthopedic discharge instructions: Keep dressing dry and intact. Keep hand elevated above heart level. May shower after dressing removed on postop day 4 (Sunday). Cover sutures with Band-Aids after drying off. Apply ice to affected area frequently. Take ibuprofen 600-800 mg TID with meals for 7-10 days, then as necessary. Take ES Tylenol or pain medication as prescribed when needed.  Return for follow-up in 10-14 days or as scheduled.   AMBULATORY SURGERY  DISCHARGE INSTRUCTIONS   The drugs that you were given will stay in your system until tomorrow so for the next 24 hours you should not:  Drive an automobile Make any legal decisions Drink any alcoholic beverage   You may resume regular meals tomorrow.  Today it is better to start with liquids and gradually work up to solid foods.  You may eat anything you prefer, but it is better to start with liquids, then soup and crackers, and gradually work up to solid foods.   Please notify your doctor immediately if you have any unusual bleeding, trouble breathing, redness and pain at the surgery site, drainage, fever, or pain not relieved by medication.    Additional Instructions:   Please contact your physician with any problems or Same Day Surgery at [phone], Monday through Friday 6 am to 4 pm, or [place] [facility] [place] Main number at [phone].  

## 2020-10-16 NOTE — Anesthesia Procedure Notes (Signed)
Procedure Name: Intubation Date/Time: 10/16/2020 12:14 PM Performed by: Omer Jack, CRNA Pre-anesthesia Checklist: Patient identified, Patient being monitored, Timeout performed, Emergency Drugs available and Suction available Patient Re-evaluated:Patient Re-evaluated prior to induction Oxygen Delivery Method: Circle system utilized Preoxygenation: Pre-oxygenation with 100% oxygen Induction Type: IV induction Ventilation: Mask ventilation without difficulty Laryngoscope Size: McGraph and 4 Grade View: Grade II Tube type: Oral Tube size: 7.0 mm Number of attempts: 2 Airway Equipment and Method: Stylet Placement Confirmation: ETT inserted through vocal cords under direct vision, positive ETCO2 and breath sounds checked- equal and bilateral Secured at: 21 cm Tube secured with: Tape Dental Injury: Teeth and Oropharynx as per pre-operative assessment

## 2020-10-16 NOTE — Op Note (Signed)
10/16/2020  1:30 PM  Patient:   Dave Conrad  Pre-Op Diagnosis:   Left volar carpal ganglion.  Post-Op Diagnosis:   Same.  Procedure:   Excision of left volar carpal ganglion.  Surgeon:   Maryagnes Amos, MD  Anesthesia:   GET  Findings:   As above.  Complications:   None  EBL:   0 cc  Fluids:   200 cc crystalloid  TT:   38 minutes at 250 mmHg  Drains:   None  Closure:   3-0 Vicryl subcuticular sutures  Brief Clinical Note:   The patient is a 45 year old male with a history of volar left wrist pain with a gradually enlarging mass on the volar radial aspect of his wrist. The symptoms have persisted despite medications, activity modification, etc. His history and examination are consistent with a volar carpal ganglion. The patient presents at this time for excision of the left volar carpal ganglion.  Procedure:   The patient was brought into the operating room and lain in the supine position. A timeout was performed to verify the appropriate surgical site. After adequate IV sedation was achieved, a Bier block was placed by the anesthesiologist and the tourniquet inflated to 250 mmHg. The left hand and upper extremity were prepped with ChloraPrep solution before being draped sterilely. Preoperative antibiotics were administered.   An approximately 3 cm incision was made longitudinally centered over the cyst. The incision was carried down through the subcutaneous tissues with care taken to identify and protect any neurovascular structures. The palmar digital nerve was retracted radially and the radial artery retracted ulnarly. Once the cyst was identified, it was dissected out circumferentially, tracking it down to the stalk, before it was removed. During its removal, the cyst ruptured, releasing approximately 1.5 cc of a straw-colored gelatinous material, consistent with a ganglion cyst. The cyst was tracked down to the carpus. A small amount of capsule was removed from the carpus in  order to minimize the likelihood of recurrence.  The wound was copiously irrigated with sterile saline solution before the skin was closed using 3-0 Vicryl subcuticular sutures. Benzoin and Steri-Strips were applied to the skin before a sterile bulky dressing was applied to the wrist. A total of 10 cc of 0.5% plain Sensorcaine was injected in and around the incision to help with postoperative analgesia before the patient was placed into a Velcro wrist immobilizer. The patient was then awakened, extubated, and returned to the recovery room in satisfactory condition after tolerating the procedure well.

## 2020-10-16 NOTE — Transfer of Care (Signed)
Immediate Anesthesia Transfer of Care Note  Patient: Dave Conrad  Procedure(s) Performed: EXCISION LEFT VOLAR CARPAL GANGLION CYST (Left: Wrist)  Patient Location: PACU  Anesthesia Type:General  Level of Consciousness: drowsy  Airway & Oxygen Therapy: Patient Spontanous Breathing and Patient connected to face mask oxygen  Post-op Assessment: Report given to RN and Post -op Vital signs reviewed and stable  Post vital signs: Reviewed and stable  Last Vitals:  Vitals Value Taken Time  BP 132/86 10/16/20 1324  Temp    Pulse 86 10/16/20 1328  Resp 14 10/16/20 1328  SpO2 100 % 10/16/20 1328  Vitals shown include unvalidated device data.  Last Pain:  Vitals:   10/16/20 0953  TempSrc: Temporal  PainSc: 0-No pain         Complications: No notable events documented.

## 2020-10-17 ENCOUNTER — Encounter: Payer: Self-pay | Admitting: Surgery

## 2020-10-17 LAB — SURGICAL PATHOLOGY

## 2020-10-17 NOTE — Anesthesia Postprocedure Evaluation (Signed)
Anesthesia Post Note  Patient: Dave Conrad  Procedure(s) Performed: EXCISION LEFT VOLAR CARPAL GANGLION CYST (Left: Wrist)  Patient location during evaluation: PACU Anesthesia Type: General Level of consciousness: awake and alert Pain management: pain level controlled Vital Signs Assessment: post-procedure vital signs reviewed and stable Respiratory status: spontaneous breathing, nonlabored ventilation, respiratory function stable and patient connected to nasal cannula oxygen Cardiovascular status: blood pressure returned to baseline and stable Postop Assessment: no apparent nausea or vomiting Anesthetic complications: no   No notable events documented.   Last Vitals:  Vitals:   10/16/20 1434 10/16/20 1504  BP: 119/88 (!) 149/72  Pulse: 86 82  Resp: 20 18  Temp: (!) 36.3 C   SpO2: 94% 95%    Last Pain:  Vitals:   10/16/20 1504  TempSrc:   PainSc: 0-No pain                 Lenard Simmer

## 2020-11-15 ENCOUNTER — Encounter: Payer: Self-pay | Admitting: Physician Assistant

## 2020-11-15 ENCOUNTER — Other Ambulatory Visit: Payer: Self-pay

## 2020-11-15 ENCOUNTER — Ambulatory Visit: Payer: Self-pay | Admitting: Physician Assistant

## 2020-11-15 DIAGNOSIS — Z113 Encounter for screening for infections with a predominantly sexual mode of transmission: Secondary | ICD-10-CM

## 2020-11-15 DIAGNOSIS — Z202 Contact with and (suspected) exposure to infections with a predominantly sexual mode of transmission: Secondary | ICD-10-CM

## 2020-11-15 MED ORDER — DOXYCYCLINE HYCLATE 100 MG PO TABS: 100.0000 mg | ORAL_TABLET | Freq: Two times a day (BID) | ORAL | 0 refills | Status: AC

## 2020-11-15 NOTE — Progress Notes (Signed)
Pt here for testing and treatment of Syphilis.  Medication dispensed per Provider orders.  Pt declined condoms. Berdie Ogren, RN

## 2020-11-15 NOTE — Progress Notes (Signed)
[facility] Department STI clinic/screening visit  Subjective:  Dave Conrad is a 45 y.o. male being seen today for an STI screening visit. The patient reports they do not have symptoms.    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  Patient reports that he takes medicines as prescribed for his chronic conditions and has regular follow up.     Screening for MPX risk: Does the patient have an unexplained rash? No Is the patient MSM? Yes Does the patient endorse multiple sex partners or anonymous sex partners? No Did the patient have close or sexual contact with a person diagnosed with MPX? No Has the patient traveled outside the [place] where MPX is endemic? No Is there a high clinical suspicion for MPX-- evidenced by one of the following No  -Unlikely to be chickenpox  -Lymphadenopathy  -Rash that present in same phase of evolution on any given body part   See flowsheet for further details and programmatic requirements.    The following portions of the  patient's history were reviewed and updated as appropriate: allergies, current medications, past medical history, past social history, past surgical history and problem list.  Objective:  There were no vitals filed for this visit.  Physical Exam Constitutional:      General: He is not in acute distress.    Appearance: Normal appearance.  HENT:     Head: Normocephalic and atraumatic.  Eyes:     Conjunctiva/sclera: Conjunctivae normal.  Pulmonary:     Effort: Pulmonary effort is normal.  Skin:    General: Skin is warm and dry.  Neurological:     Mental Status: He is alert and oriented to person, place, and time.  Psychiatric:        Mood and Affect: Mood normal.        Behavior: Behavior normal.        Thought Content: Thought content normal.        Judgment: Judgment normal.      Assessment and Plan:  Zamarion Isidore is a 45 y.o. male presenting to the [facility] Department for STI screening  1. Screening for STD (sexually transmitted disease) Patient into clinic without symptoms. Patient declines screening tests/exam for everything except the RPR for Syphilis to check his titer. Rec condoms with all sex. Await test results.  Counseled that RN will call if needs to RTC for treatment once results are back.  - Syphilis Serology, [facility]  2. Syphilis contact Treat as a contact to Syphilis  with Doxycycline 100 mg #28  1 po BID for 14 days. No sex for 14 days and until after partner completes treatment. Call with questions or concerns.  - doxycycline (VIBRA-TABS) 100 MG tablet; Take 1 tablet (100 mg total) by mouth 2 (two) times daily for 14 days.  Dispense: 28 tablet; Refill: 0     No follow-ups on file.  Future Appointments  Date Time Provider Department Center  02/21/2021  8:00 AM [facility] None  02/26/2021  9:00 AM [place], [place], PA-C [facility] None    Matt Holmes, [place]

## 2020-12-04 ENCOUNTER — Ambulatory Visit: Payer: Self-pay | Admitting: Nurse Practitioner

## 2020-12-04 ENCOUNTER — Other Ambulatory Visit: Payer: Self-pay

## 2020-12-04 ENCOUNTER — Encounter: Payer: Self-pay | Admitting: Nurse Practitioner

## 2020-12-04 DIAGNOSIS — Z113 Encounter for screening for infections with a predominantly sexual mode of transmission: Secondary | ICD-10-CM

## 2020-12-04 DIAGNOSIS — Z202 Contact with and (suspected) exposure to infections with a predominantly sexual mode of transmission: Secondary | ICD-10-CM

## 2020-12-04 MED ORDER — AZITHROMYCIN 500 MG PO TABS
1000.0000 mg | ORAL_TABLET | Freq: Once | ORAL | Status: AC
  Administered 2020-12-04: 1000 mg via ORAL

## 2020-12-04 NOTE — Progress Notes (Signed)
[facility] Department STI clinic/screening visit  Subjective:  Dave Conrad is a 45 y.o. male being seen today for an STI screening visit. The patient reports they do not have symptoms.    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  Recently was treated with doxycycline for an elevated Syphilis titer.  Finished dose of doxycycline on 11/30/2020.    Does the patient or their partner desires a pregnancy in the next year? No  Screening for MPX risk: Does the patient have an unexplained rash? No Is the patient MSM? Yes Does the patient endorse multiple sex partners or anonymous sex partners? No Did the patient have close or sexual contact with a person diagnosed with MPX? No Has the patient traveled outside the [place] where MPX is endemic? No Is there a high clinical suspicion for MPX-- evidenced by one of the following No  -Unlikely to be chickenpox  -Lymphadenopathy  -Rash that present in same phase of evolution on any given body part   See flowsheet for further details and programmatic  requirements.    The following portions of the patient's history were reviewed and updated as appropriate: allergies, current medications, past medical history, past social history, past surgical history and problem list.  Objective:  There were no vitals filed for this visit.  Physical Exam Constitutional:      Appearance: Normal appearance. He is obese.  HENT:     Head: Normocephalic and atraumatic.  Pulmonary:     Effort: Pulmonary effort is normal.  Genitourinary:    Comments: Exam deferred Skin:    General: Skin is warm and dry.  Neurological:     Mental Status: He is alert and oriented to person, place, and time.  Psychiatric:        Mood and Affect: Mood normal.        Behavior: Behavior normal.      Assessment and Plan:  Dave Conrad is a 45 y.o. male presenting to the [facility] Department for STI screening  1. Screening examination for venereal disease Patient does not have STI symptoms Patient accepted screenings urine CT/GC and declined bloodwork.  Patient meets criteria for HepB screening? Yes. Ordered? No - tested by PCP Patient meets criteria for HepC screening? Yes. Ordered? No - tested by PCP Recommended condom use with all sex Discussed importance of condom use for STI prevent  Discussed time line for [facility] Lab results and that patient will be called with positive results and encouraged patient to call if he had not heard in 2 weeks Recommended returning for continued or worsening symptoms.   -  Chlamydia/Gonorrhea  Lab  2. Exposure to chlamydia Patient treated as a contact to Chlamydia  - azithromycin (ZITHROMAX) tablet 1,000 mg      No follow-ups on file.  Future Appointments  Date Time Provider Department Center  02/21/2021  8:00 AM [facility] None  02/26/2021  9:00 AM McGowan, [place], PA-C [facility] None    Glenna Fellows, NP

## 2020-12-04 NOTE — Progress Notes (Signed)
  Accompanied APP on pt visit.  Reviewed APP documentation for flowsheet and exam note.  Agree that documentation meets STD program requirements and /or guidelines and orders are placed appropriately .  Will order medication since not cleared to order medications yet.     Wendi Snipes, FNP

## 2020-12-04 NOTE — Progress Notes (Signed)
Addendum:  Discussed risk factors for monkeypox.  Patient stated that he has been previously vaccinated for the MPX vaccine.  Glenna Fellows, NP

## 2020-12-08 NOTE — Progress Notes (Signed)
Chart reviewed by Pharmacist  Suzanne Walker PharmD, Contract Pharmacist at [facility]  

## 2020-12-15 ENCOUNTER — Other Ambulatory Visit: Payer: Self-pay | Admitting: Urology

## 2021-01-04 ENCOUNTER — Other Ambulatory Visit: Payer: Self-pay | Admitting: Physician Assistant

## 2021-02-21 ENCOUNTER — Other Ambulatory Visit: Payer: Self-pay

## 2021-02-21 ENCOUNTER — Other Ambulatory Visit: Payer: Managed Care, Other (non HMO)

## 2021-02-21 DIAGNOSIS — N138 Other obstructive and reflux uropathy: Secondary | ICD-10-CM

## 2021-02-21 DIAGNOSIS — N529 Male erectile dysfunction, unspecified: Secondary | ICD-10-CM

## 2021-02-21 DIAGNOSIS — E349 Endocrine disorder, unspecified: Secondary | ICD-10-CM

## 2021-02-22 LAB — HEMOGLOBIN AND HEMATOCRIT, BLOOD
Hematocrit: 45.1 % (ref 37.5–51.0)
Hemoglobin: 14.8 g/dL (ref 13.0–17.7)

## 2021-02-22 LAB — PSA: Prostate Specific Ag, Serum: 0.2 ng/mL (ref 0.0–4.0)

## 2021-02-22 LAB — TESTOSTERONE: Testosterone: 397 ng/dL (ref 264–916)

## 2021-02-26 ENCOUNTER — Ambulatory Visit: Payer: Managed Care, Other (non HMO) | Admitting: Urology

## 2021-03-07 ENCOUNTER — Ambulatory Visit: Payer: Managed Care, Other (non HMO) | Admitting: Urology

## 2021-03-19 NOTE — Progress Notes (Signed)
03/20/2021 9:06 AM   [name] Jan 14, 1976 [id number]  Referring provider: [facility] [place],  [place] [address]  Chief Complaint  Patient presents with   Hypogonadism   Erectile Dysfunction   Benign Prostatic Hypertrophy    Urological history: 1. Testosterone deficiency -contributing factors of age, diabetes, HIV and obesity -testosterone 397 01/2021 -Hct and Hgb 14.8/45.1% 01/2021 -managed with testosterone cypionate 200mg /mL, 1 cc every 14 days  2. ED - contributing factors of age, BPH, DM, former smoker, HTN and HLD  -SHIM 25 - managed with tadalafil 5 mg daily  3. BPH with LU TS - PSA 0.2 01/2021 - I PSS 2/1  4.  Nephrolithiasis -Stone composition of 50% calcium phosphate, 40% magnesium ammonium phosphate and 10% ammonia acid urate  -staged procedure with PCNL 2018 -No stone seen on triphasic study in 2021  5. Urethral stricture -dilated bulbar urethral stricture 2018   Chief Complaint  Patient presents with   Hypogonadism   Erectile Dysfunction   Benign Prostatic Hypertrophy     HPI: Dave Conrad is a 46 y.o. diabetic male who presents today for 6 month follow up.   Patient still having spontaneous erections.  He denies any pain or curvature with erections.     SHIM     Row Name 03/20/21 0845         SHIM: Over the last 6 months:   How do you rate your confidence that you could get and keep an erection? Very High     When you had erections with sexual stimulation, how often were your erections hard enough for penetration (entering your partner)? Almost Always or Always     During sexual intercourse, how often were you able to maintain your erection after you had penetrated (entered) your partner? Almost Always or Always     During sexual intercourse, how difficult was it to maintain your erection to completion of intercourse? Not Difficult     When you attempted sexual intercourse, how often was it satisfactory  for you? Almost Always or Always       SHIM Total Score   SHIM 25               Score: 1-7 Severe ED 8-11 Moderate ED 12-16 Mild-Moderate ED 17-21 Mild ED 22-25 No ED    No urinary complaints.  Patient denies any modifying or aggravating factors.  Patient denies any gross hematuria, dysuria or suprapubic/flank pain.  Patient denies any fevers, chills, nausea or vomiting.    IPSS     Row Name 03/20/21 0800         International Prostate Symptom Score   How often have you had the sensation of not emptying your bladder? Not at All     How often have you had to urinate less than every two hours? Less than 1 in 5 times     How often have you found you stopped and started again several times when you urinated? Not at All     How often have you found it difficult to postpone urination? Not at All     How often have you had a weak urinary stream? Not at All     How often have you had to strain to start urination? Not at All     How many times did you typically get up at night to urinate? 1 Time     Total IPSS Score 2  Quality of Life due to urinary symptoms   If you were to spend the rest of your life with your urinary condition just the way it is now how would you feel about that? Pleased                Score:  1-7 Mild 8-19 Moderate 20-35 Severe   PMH: Past Medical History:  Diagnosis Date   Asplenia 05/08/2016   Diabetes mellitus without complication ([place])    H/O sepsis 05/06/2016   due to right kidney stone   History of kidney stones    HIV (human immunodeficiency virus infection) ([place])    Hyperlipidemia    Hypertension    Positive serology for syphilis 10/30/2015    Surgical History: Past Surgical History:  Procedure Laterality Date   CYSTOSCOPY W/ RETROGRADES Bilateral 07/13/2016   Procedure: CYSTOSCOPY WITH RETROGRADE PYELOGRAM;  Surgeon: Hollice Espy, MD;  Location: [facility];  Service: Urology;  Laterality: Bilateral;   CYSTOSCOPY WITH  STENT PLACEMENT Right 05/09/2016   Procedure: CYSTOSCOPY WITH STENT PLACEMENT and retrorade pyelogram;  Surgeon: Ardis Hughs, MD;  Location: [facility];  Service: Urology;  Laterality: Right;   CYSTOSCOPY WITH URETHRAL DILATATION N/A 06/22/2016   Procedure: CYSTOSCOPY WITH URETHRAL DILATATION;  Surgeon: Hollice Espy, MD;  Location: [facility];  Service: Urology;  Laterality: N/A;   CYSTOSCOPY/URETEROSCOPY/HOLMIUM LASER/STENT PLACEMENT Right 07/13/2016   Procedure: CYSTOSCOPY/URETEROSCOPY/HOLMIUM LASER/STENT EXCHANGE;  Surgeon: Hollice Espy, MD;  Location: [facility];  Service: Urology;  Laterality: Right;   GANGLION CYST EXCISION Left 10/16/2020   Procedure: EXCISION LEFT VOLAR CARPAL GANGLION CYST;  Surgeon: Corky Mull, MD;  Location: [facility];  Service: Orthopedics;  Laterality: Left;   HOLMIUM LASER APPLICATION N/A 5/73/2202   Procedure: HOLMIUM LASER APPLICATION;  Surgeon: Hollice Espy, MD;  Location: [facility];  Service: Urology;  Laterality: N/A;   IR NEPHROSTOMY PLACEMENT RIGHT  06/22/2016   NEPHROLITHOTOMY Right 06/22/2016   Procedure: NEPHROLITHOTOMY PERCUTANEOUS;  Surgeon: Hollice Espy, MD;  Location: [facility];  Service: Urology;  Laterality: Right;   SPLENECTOMY, TOTAL  1982   URETEROSCOPY Right 06/22/2016   Procedure: ANTEGRADE URETEROSCOPY;  Surgeon: Hollice Espy, MD;  Location: [facility];  Service: Urology;  Laterality: Right;    Home Medications:  Allergies as of 03/20/2021       Reactions   Dulaglutide Rash   Shellfish Allergy Anaphylaxis   Penicillins Nausea And Vomiting   Has patient had a PCN reaction causing immediate rash, facial/tongue/throat swelling, SOB or lightheadedness with hypotension: yes rash Has patient had a PCN reaction causing severe rash involving mucus membranes or skin necrosis:no Has patient had a PCN reaction that required hospitalization no Has patient had a PCN reaction occurring within the last 10 years: yes If all of the above answers are  "NO", then may proceed with Cephalosporin use.        Medication List        Accurate as of March 20, 2021  9:06 AM. If you have any questions, ask your nurse or doctor.          2-3CC SYRINGE 3 ML Misc 1 mg by Does not apply route every 14 (fourteen) days.   aspirin EC 81 MG tablet Take 81 mg by mouth at bedtime.   BD Disp Needles 18G X 1-1/2" Misc Generic drug: NEEDLE (DISP) 18 G 1 mg by Does not apply route every 14 (fourteen) days.   BD Disp Needles 21G X 1-1/2" Misc Generic drug: NEEDLE (DISP) 21  G USE 1 EVERY 2 WEEKS   Biktarvy 50-200-25 MG Tabs tablet Generic drug: bictegravir-emtricitabine-tenofovir AF TAKE 1 TABLET BY MOUTH ONCE DAILY IN PLACE OF ATRIPLA   diltiazem 240 MG 24 hr capsule Commonly known as: CARDIZEM CD Take 240 mg by mouth daily.   EPINEPHrine 0.3 mg/0.3 mL Soaj injection Commonly known as: EPI-PEN Inject 0.3 mg into the muscle as needed for anaphylaxis.   ferrous sulfate 325 (65 FE) MG EC tablet Take 325 mg by mouth daily with breakfast.   FreeStyle Libre 14 Day Sensor Misc SMARTSIG:1 Kit(s) Topical Every 2 Weeks   glipiZIDE 10 MG 24 hr tablet Commonly known as: GLUCOTROL XL Take 10 mg by mouth 2 (two) times daily.   hydrochlorothiazide 25 MG tablet Commonly known as: HYDRODIURIL Take 25 mg by mouth daily.   HYDROcodone-acetaminophen 5-325 MG tablet Commonly known as: Norco Take 1-2 tablets by mouth every 6 (six) hours as needed for moderate pain or severe pain. MAXIMUM TOTAL ACETAMINOPHEN DOSE IS 4000 MG PER DAY   insulin regular 100 units/mL injection Commonly known as: NOVOLIN R SLIDING SCALE INSULIN 151-200-2 UNITS 201-250- 4 UNITS 251-300-6 UNITS 301-350-8 UNITS 351-400-10 UNITS IF OVER 400 CALL MD   Levemir FlexTouch 100 UNIT/ML FlexPen Generic drug: insulin detemir Inject 20 Units into the skin at bedtime.   lisinopril 40 MG tablet Commonly known as: ZESTRIL Take 40 mg by mouth daily.   metFORMIN 500 MG  tablet Commonly known as: GLUCOPHAGE Take 1,000 mg by mouth 2 (two) times daily with a meal.   rosuvastatin 10 MG tablet Commonly known as: CRESTOR Take 10 mg by mouth daily.   tadalafil 5 MG tablet Commonly known as: CIALIS Take 1 tablet (5 mg total) by mouth daily.   testosterone cypionate 200 MG/ML injection Commonly known as: DEPOTESTOSTERONE CYPIONATE Inject 1 cc into the muscle every 14 days What changed: See the new instructions. Changed by: Zara Council, PA-C   timolol 0.5 % ophthalmic solution Commonly known as: BETIMOL Place 1 drop into both eyes daily.        Allergies:  Allergies  Allergen Reactions   Dulaglutide Rash   Shellfish Allergy Anaphylaxis   Penicillins Nausea And Vomiting    Has patient had a PCN reaction causing immediate rash, facial/tongue/throat swelling, SOB or lightheadedness with hypotension: yes rash Has patient had a PCN reaction causing severe rash involving mucus membranes or skin necrosis:no Has patient had a PCN reaction that required hospitalization no Has patient had a PCN reaction occurring within the last 10 years: yes If all of the above answers are "NO", then may proceed with Cephalosporin use.     Family History: Family History  Problem Relation Age of Onset   Breast cancer Mother    Kidney cancer Neg Hx    Kidney disease Neg Hx    Prostate cancer Neg Hx     Social History:  reports that he has been smoking cigars. He has never used smokeless tobacco. He reports current alcohol use of about 4.0 standard drinks per week. He reports that he does not use drugs.  ROS: Pertinent ROS in HPI  Physical Exam: BP 138/88    Pulse 92    Ht $R'5\' 7"'qT$  (1.702 m)    Wt 289 lb (131.1 kg)    BMI 45.26 kg/m   Constitutional:  Well nourished. Alert and oriented, No acute distress. HEENT: [place] AT, mask in place.  Trachea midline Cardiovascular: No clubbing, cyanosis, or edema. Respiratory: Normal respiratory effort, no  increased work of  breathing. Neurologic: Grossly intact, no focal deficits, moving all 4 extremities. Psychiatric: Normal mood and affect.   Laboratory Data: Component     Latest Ref Rng & Units 02/08/2020 09/19/2020 02/21/2021  Prostate Specific Ag, Serum     0.0 - 4.0 ng/mL 0.3 0.2 0.2   Component     Latest Ref Rng & Units 08/25/2019 09/01/2019 11/16/2019 02/08/2020  Testosterone     264 - 916 ng/dL 223 (L) 212 (L) 664 756   Component     Latest Ref Rng & Units 09/19/2020 02/21/2021  Testosterone     264 - 916 ng/dL 443 397   Component     Latest Ref Rng & Units 05/12/2016 06/08/2016 06/22/2016 06/23/2016         4:48 AM     Hemoglobin     13.0 - 17.7 g/dL 8.2 (L) 11.7 (L) 12.8 (L) 10.9 (L)  HCT     37.5 - 51.0 % 25.2 (L) 35.0 (L) 37.9 (L) 33.1 (L)   Component     Latest Ref Rng & Units 04/03/2019 02/08/2020 09/19/2020 10/10/2020             Hemoglobin     13.0 - 17.7 g/dL 14.5 14.7 14.9 15.9  HCT     37.5 - 51.0 % 44.1 45.7 45.7 45.8   Component     Latest Ref Rng & Units 02/21/2021          Hemoglobin     13.0 - 17.7 g/dL 14.8  HCT     37.5 - 51.0 % 45.1   Hemoglobin A1C <6.5 % 9.8 High    Average Blood Glucose (Calculated From HgBA1c Level) mg/dL 245   Resulting Agency  [facility]  Narrative Performed by [place] POC TEST(S) ABOVE PERFORMED AT THE PATIENT CARE LOCATION AND OVERSEEN BY THE [facility] POCT PROGRAM. Specimen Collected: 01/02/21 14:51 Last Resulted: 01/02/21 15:09  Received From: [facility]  Result Received: 01/06/21 07:34   Sodium 135 - 145 mmol/L 136   Potassium 3.5 - 5.0 mmol/L 3.3 Low    Chloride 98 - 108 mmol/L 97 Low    Carbon Dioxide (CO2) 21 - 30 mmol/L 27   Urea Nitrogen (BUN) 7 - 20 mg/dL 11   Creatinine 0.6 - 1.3 mg/dL 1.1   Glucose 70 - 140 mg/dL 150 High    Comment: Interpretive Data:  Above is the NONFASTING reference range.   Below are the FASTING reference ranges:  NORMAL:      70-99 mg/dL  PREDIABETES:  100-125 mg/dL  DIABETES:    > 125 mg/dL   Calcium 8.7 - 10.2 mg/dL 9.6   AST (Aspartate Aminotransferase) 15 - 41 U/L 22   ALT (Alanine Aminotransferase) 17 - 63 U/L 28   Bilirubin, Total 0.4 - 1.5 mg/dL 0.4   Alk Phos (Alkaline Phosphatase) 24 - 110 U/L 42   Albumin 3.5 - 4.8 g/dL 4.4   Protein, Total 6.2 - 8.1 g/dL 7.1   Anion Gap 3 - 12 mmol/L 12   BUN/CREA Ratio 6 - 27 10   Glomerular Filtration Rate (eGFR)  mL/min/1.73sq m 85   Comment: CKD-EPI (2021) does not include patient's race in the calculation of eGFR. Monitoring changes of plasma creatinine and eGFR over time is useful for monitoring kidney function.  This change was made on 04/23/2020.   Interpretive Ranges for eGFR(CKD-EPI 2021):   eGFR:              >  60 mL/min/1.73 sq m - Normal  eGFR:              30 - 59 mL/min/1.73 sq m - Moderately Decreased  eGFR:              15 - 29 mL/min/1.73 sq m - Severely Decreased  eGFR:              < 15 mL/min/1.73 sq m -  Kidney Failure    Note: These eGFR calculations do not apply in acute situations  when eGFR is changing rapidly or in patients on dialysis.   Resulting Agency  [facility]  Specimen Collected: 12/05/20 15:02 Last Resulted: 12/05/20 18:03  Received From: [place]  Result Received: 12/08/20 16:34  I have reviewed the labs.   Pertinent Imaging: N/A Assessment & Plan:   1. Testosterone deficiency -continue testosterone cypionate 200 mg/milliliters, 1 cc every 14 days -Refill given for testosterone cypionate  2. Erectile dysfunction -continue Cialis 5 mg daily  3. BPH with LUTS -PSA stable -UA benign -continue conservative management, avoiding bladder irritants and timed voiding's -Continue Cialis 5 mg daily  Return in about 6 months (around 09/17/2021) for PSA, testosterone (one week after injection) H & H, IPSS, SHIM and exam.  These notes generated with voice recognition software. I apologize for typographical  errors.  Zara Council, PA-C  [facility] [address]  [place], [place] [address] [phone]

## 2021-03-20 ENCOUNTER — Other Ambulatory Visit: Payer: Self-pay

## 2021-03-20 ENCOUNTER — Encounter: Payer: Self-pay | Admitting: Urology

## 2021-03-20 ENCOUNTER — Ambulatory Visit (INDEPENDENT_AMBULATORY_CARE_PROVIDER_SITE_OTHER): Payer: Managed Care, Other (non HMO) | Admitting: Urology

## 2021-03-20 VITALS — BP 138/88 | HR 92 | Ht 67.0 in | Wt 289.0 lb

## 2021-03-20 DIAGNOSIS — N529 Male erectile dysfunction, unspecified: Secondary | ICD-10-CM

## 2021-03-20 DIAGNOSIS — E349 Endocrine disorder, unspecified: Secondary | ICD-10-CM | POA: Diagnosis not present

## 2021-03-20 DIAGNOSIS — N138 Other obstructive and reflux uropathy: Secondary | ICD-10-CM | POA: Diagnosis not present

## 2021-03-20 DIAGNOSIS — N401 Enlarged prostate with lower urinary tract symptoms: Secondary | ICD-10-CM | POA: Diagnosis not present

## 2021-03-20 DIAGNOSIS — E291 Testicular hypofunction: Secondary | ICD-10-CM | POA: Diagnosis not present

## 2021-03-20 MED ORDER — TESTOSTERONE CYPIONATE 200 MG/ML IM SOLN
200.0000 mg | Freq: Once | INTRAMUSCULAR | Status: AC
  Administered 2021-03-20: 200 mg via INTRAMUSCULAR

## 2021-03-20 MED ORDER — TESTOSTERONE CYPIONATE 200 MG/ML IM SOLN: INTRAMUSCULAR | 0 refills | Status: DC

## 2021-03-20 NOTE — Addendum Note (Signed)
Addended by: Verlene Mayer A on: 03/20/2021 09:10 AM   Modules accepted: Orders

## 2021-04-21 ENCOUNTER — Other Ambulatory Visit: Payer: Self-pay | Admitting: Urology

## 2021-04-21 NOTE — Progress Notes (Signed)
Dave Conrad is requesting a refill on his testosterone cypionate, but it is too early for refill.

## 2021-04-30 ENCOUNTER — Telehealth: Payer: Self-pay | Admitting: Urology

## 2021-04-30 NOTE — Telephone Encounter (Signed)
We received a denial from his insurance for his testosterone cypionate.  Is this accurate?  Is he purchasing the medication himself? ?

## 2021-05-02 ENCOUNTER — Telehealth: Payer: Self-pay | Admitting: *Deleted

## 2021-05-02 NOTE — Telephone Encounter (Signed)
Sent prior auth to [facility] for testosterone cypionate ?

## 2021-05-02 NOTE — Telephone Encounter (Signed)
Pt sent mychart 05/01/2021, yes it was denied and no he is not paying out of pocket. I will try the appeal. ?

## 2021-05-16 NOTE — Telephone Encounter (Signed)
Incoming denial letter of testosterone cypionate stating that the patient must have 2 low serum levels for approval. Reviewed pt's chart, found low T levels faxed lab results to [phone].  ?

## 2021-05-20 ENCOUNTER — Other Ambulatory Visit: Payer: Self-pay | Admitting: Urology

## 2021-05-20 DIAGNOSIS — E349 Endocrine disorder, unspecified: Secondary | ICD-10-CM

## 2021-05-20 MED ORDER — TESTOSTERONE CYPIONATE 200 MG/ML IM SOLN: INTRAMUSCULAR | 0 refills | Status: AC

## 2021-09-01 ENCOUNTER — Other Ambulatory Visit: Payer: Self-pay | Admitting: Urology

## 2021-09-12 ENCOUNTER — Other Ambulatory Visit: Payer: Self-pay

## 2021-09-12 DIAGNOSIS — N138 Other obstructive and reflux uropathy: Secondary | ICD-10-CM

## 2021-09-12 DIAGNOSIS — E349 Endocrine disorder, unspecified: Secondary | ICD-10-CM

## 2021-09-16 ENCOUNTER — Other Ambulatory Visit: Payer: Managed Care, Other (non HMO)

## 2021-09-19 ENCOUNTER — Ambulatory Visit: Payer: Managed Care, Other (non HMO) | Admitting: Urology

## 2021-12-10 ENCOUNTER — Other Ambulatory Visit: Payer: Self-pay | Admitting: Urology

## 2021-12-10 DIAGNOSIS — N401 Enlarged prostate with lower urinary tract symptoms: Secondary | ICD-10-CM

## 2021-12-10 MED ORDER — TADALAFIL 5 MG PO TABS: 5.0000 mg | ORAL_TABLET | Freq: Every day | ORAL | 0 refills | Status: AC

## 2022-04-08 ENCOUNTER — Other Ambulatory Visit: Payer: Self-pay | Admitting: Urology

## 2022-04-08 DIAGNOSIS — N401 Enlarged prostate with lower urinary tract symptoms: Secondary | ICD-10-CM
